# Patient Record
Sex: Female | Born: 1945 | Race: White | Hispanic: No | Marital: Married | Smoking: Never smoker
Health system: Southern US, Community
[De-identification: ages and names within clinical notes are randomized; demographics above are authoritative.]

## PROBLEM LIST (undated history)

## (undated) DIAGNOSIS — R7301 Impaired fasting glucose: Secondary | ICD-10-CM

## (undated) DIAGNOSIS — Z9289 Personal history of other medical treatment: Secondary | ICD-10-CM

## (undated) DIAGNOSIS — D249 Benign neoplasm of unspecified breast: Secondary | ICD-10-CM

## (undated) DIAGNOSIS — R197 Diarrhea, unspecified: Secondary | ICD-10-CM

## (undated) DIAGNOSIS — I10 Essential (primary) hypertension: Secondary | ICD-10-CM

## (undated) DIAGNOSIS — IMO0001 Reserved for inherently not codable concepts without codable children: Secondary | ICD-10-CM

## (undated) DIAGNOSIS — M1990 Unspecified osteoarthritis, unspecified site: Secondary | ICD-10-CM

## (undated) DIAGNOSIS — I639 Cerebral infarction, unspecified: Secondary | ICD-10-CM

## (undated) DIAGNOSIS — K9189 Other postprocedural complications and disorders of digestive system: Secondary | ICD-10-CM

## (undated) DIAGNOSIS — K589 Irritable bowel syndrome without diarrhea: Secondary | ICD-10-CM

## (undated) DIAGNOSIS — K5730 Diverticulosis of large intestine without perforation or abscess without bleeding: Secondary | ICD-10-CM

## (undated) DIAGNOSIS — M722 Plantar fascial fibromatosis: Secondary | ICD-10-CM

## (undated) DIAGNOSIS — K219 Gastro-esophageal reflux disease without esophagitis: Secondary | ICD-10-CM

## (undated) DIAGNOSIS — F5104 Psychophysiologic insomnia: Secondary | ICD-10-CM

## (undated) DIAGNOSIS — J302 Other seasonal allergic rhinitis: Secondary | ICD-10-CM

## (undated) DIAGNOSIS — Z9889 Other specified postprocedural states: Secondary | ICD-10-CM

## (undated) HISTORY — DX: Essential (primary) hypertension: I10

## (undated) HISTORY — DX: Diarrhea, unspecified: R19.7

## (undated) HISTORY — DX: Psychophysiologic insomnia: F51.04

## (undated) HISTORY — DX: Reserved for inherently not codable concepts without codable children: IMO0001

## (undated) HISTORY — DX: Gastro-esophageal reflux disease without esophagitis: K21.9

## (undated) HISTORY — DX: Irritable bowel syndrome, unspecified: K58.9

## (undated) HISTORY — PX: KNEE SURGERY: SHX244

## (undated) HISTORY — DX: Plantar fascial fibromatosis: M72.2

## (undated) HISTORY — DX: Other seasonal allergic rhinitis: J30.2

## (undated) HISTORY — PX: LUMBAR LAMINECTOMY: SHX95

## (undated) HISTORY — DX: Cerebral infarction, unspecified: I63.9

## (undated) HISTORY — DX: Other specified postprocedural states: Z98.890

## (undated) HISTORY — DX: Benign neoplasm of unspecified breast: D24.9

## (undated) HISTORY — DX: Unspecified osteoarthritis, unspecified site: M19.90

## (undated) HISTORY — DX: Other postprocedural complications and disorders of digestive system: K91.89

## (undated) HISTORY — DX: Impaired fasting glucose: R73.01

## (undated) HISTORY — DX: Diverticulosis of large intestine without perforation or abscess without bleeding: K57.30

## (undated) SURGERY — EGD (ESOPHAGOGASTRODUODENOSCOPY)
Anesthesia: Moderate Sedation

---
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
## 1982-11-30 HISTORY — PX: ABDOMINAL HYSTERECTOMY: SHX81

## 1985-11-30 HISTORY — PX: OTHER SURGICAL HISTORY: SHX169

## 1999-12-02 ENCOUNTER — Other Ambulatory Visit: Admission: RE | Admit: 1999-12-02 | Discharge: 1999-12-02 | Payer: Self-pay | Admitting: Obstetrics and Gynecology

## 2000-12-06 ENCOUNTER — Other Ambulatory Visit: Admission: RE | Admit: 2000-12-06 | Discharge: 2000-12-06 | Payer: Self-pay | Admitting: Obstetrics and Gynecology

## 2001-12-07 ENCOUNTER — Other Ambulatory Visit: Admission: RE | Admit: 2001-12-07 | Discharge: 2001-12-07 | Payer: Self-pay | Admitting: Obstetrics and Gynecology

## 2002-05-15 ENCOUNTER — Ambulatory Visit (HOSPITAL_COMMUNITY): Admission: RE | Admit: 2002-05-15 | Discharge: 2002-05-15 | Payer: Self-pay | Admitting: Gastroenterology

## 2003-03-06 ENCOUNTER — Other Ambulatory Visit: Admission: RE | Admit: 2003-03-06 | Discharge: 2003-03-06 | Payer: Self-pay | Admitting: Obstetrics and Gynecology

## 2004-05-13 ENCOUNTER — Encounter: Admission: RE | Admit: 2004-05-13 | Discharge: 2004-05-13 | Payer: Self-pay | Admitting: Internal Medicine

## 2004-06-25 ENCOUNTER — Other Ambulatory Visit: Admission: RE | Admit: 2004-06-25 | Discharge: 2004-06-25 | Payer: Self-pay | Admitting: Obstetrics and Gynecology

## 2005-08-05 ENCOUNTER — Other Ambulatory Visit: Admission: RE | Admit: 2005-08-05 | Discharge: 2005-08-05 | Payer: Self-pay | Admitting: Obstetrics and Gynecology

## 2005-08-13 ENCOUNTER — Ambulatory Visit (HOSPITAL_COMMUNITY): Admission: RE | Admit: 2005-08-13 | Discharge: 2005-08-13 | Payer: Self-pay | Admitting: *Deleted

## 2005-10-30 ENCOUNTER — Ambulatory Visit (HOSPITAL_COMMUNITY): Admission: RE | Admit: 2005-10-30 | Discharge: 2005-10-30 | Payer: Self-pay | Admitting: General Surgery

## 2005-10-30 ENCOUNTER — Encounter (INDEPENDENT_AMBULATORY_CARE_PROVIDER_SITE_OTHER): Payer: Self-pay | Admitting: Specialist

## 2005-11-30 DIAGNOSIS — M8580 Other specified disorders of bone density and structure, unspecified site: Secondary | ICD-10-CM

## 2005-11-30 HISTORY — DX: Other specified disorders of bone density and structure, unspecified site: M85.80

## 2007-09-14 ENCOUNTER — Other Ambulatory Visit: Admission: RE | Admit: 2007-09-14 | Discharge: 2007-09-14 | Payer: Self-pay | Admitting: Obstetrics and Gynecology

## 2007-12-01 HISTORY — PX: CHOLECYSTECTOMY: SHX55

## 2008-01-02 ENCOUNTER — Encounter: Admission: RE | Admit: 2008-01-02 | Discharge: 2008-01-02 | Payer: Self-pay | Admitting: Gastroenterology

## 2008-11-30 DIAGNOSIS — K922 Gastrointestinal hemorrhage, unspecified: Secondary | ICD-10-CM

## 2008-11-30 HISTORY — DX: Gastrointestinal hemorrhage, unspecified: K92.2

## 2009-06-24 ENCOUNTER — Observation Stay (HOSPITAL_COMMUNITY): Admission: AD | Admit: 2009-06-24 | Discharge: 2009-06-26 | Payer: Self-pay | Admitting: Internal Medicine

## 2009-06-30 DIAGNOSIS — S62102A Fracture of unspecified carpal bone, left wrist, initial encounter for closed fracture: Secondary | ICD-10-CM

## 2009-06-30 HISTORY — DX: Fracture of unspecified carpal bone, left wrist, initial encounter for closed fracture: S62.102A

## 2009-08-16 ENCOUNTER — Ambulatory Visit (HOSPITAL_COMMUNITY): Admission: RE | Admit: 2009-08-16 | Discharge: 2009-08-16 | Payer: Self-pay | Admitting: Specialist

## 2009-08-16 ENCOUNTER — Encounter (INDEPENDENT_AMBULATORY_CARE_PROVIDER_SITE_OTHER): Payer: Self-pay | Admitting: Specialist

## 2009-08-16 ENCOUNTER — Ambulatory Visit: Payer: Self-pay | Admitting: Vascular Surgery

## 2009-08-29 ENCOUNTER — Encounter: Admission: RE | Admit: 2009-08-29 | Discharge: 2009-08-29 | Payer: Self-pay | Admitting: Specialist

## 2009-11-30 DIAGNOSIS — D249 Benign neoplasm of unspecified breast: Secondary | ICD-10-CM

## 2009-11-30 HISTORY — PX: BREAST BIOPSY: SHX20

## 2009-11-30 HISTORY — DX: Benign neoplasm of unspecified breast: D24.9

## 2009-12-23 ENCOUNTER — Other Ambulatory Visit: Admission: RE | Admit: 2009-12-23 | Discharge: 2009-12-23 | Payer: Self-pay | Admitting: Obstetrics and Gynecology

## 2009-12-23 ENCOUNTER — Ambulatory Visit: Payer: Self-pay | Admitting: Obstetrics and Gynecology

## 2011-03-08 LAB — CROSSMATCH
ABO/RH(D): A POS
Antibody Screen: NEGATIVE

## 2011-03-08 LAB — COMPREHENSIVE METABOLIC PANEL
ALT: 28 U/L (ref 0–35)
AST: 33 U/L (ref 0–37)
Albumin: 3.9 g/dL (ref 3.5–5.2)
Alkaline Phosphatase: 84 U/L (ref 39–117)
BUN: 7 mg/dL (ref 6–23)
CO2: 29 mEq/L (ref 19–32)
Calcium: 9.4 mg/dL (ref 8.4–10.5)
Chloride: 103 mEq/L (ref 96–112)
Creatinine, Ser: 0.76 mg/dL (ref 0.4–1.2)
GFR calc Af Amer: >60 mL/min (ref >60)
GFR calc non Af Amer: >60 mL/min (ref >60)
Glucose, Bld: 99 mg/dL (ref 70–99)
Potassium: 3.8 mEq/L (ref 3.5–5.1)
Sodium: 140 mEq/L (ref 135–145)
Total Bilirubin: 0.9 mg/dL (ref 0.3–1.2)
Total Protein: 6.8 g/dL (ref 6.0–8.3)

## 2011-03-08 LAB — HEMOGLOBIN AND HEMATOCRIT, BLOOD
HCT: 40.3 % (ref 36.0–46.0)
HCT: 41.3 % (ref 36.0–46.0)
Hemoglobin: 13.9 g/dL (ref 12.0–15.0)
Hemoglobin: 14.1 g/dL (ref 12.0–15.0)

## 2011-03-08 LAB — CBC
HCT: 40.1 % (ref 36.0–46.0)
HCT: 43.6 % (ref 36.0–46.0)
Hemoglobin: 14 g/dL (ref 12.0–15.0)
Hemoglobin: 14.8 g/dL (ref 12.0–15.0)
MCHC: 34 g/dL (ref 30.0–36.0)
MCHC: 34.8 g/dL (ref 30.0–36.0)
MCV: 91.8 fL (ref 78.0–100.0)
MCV: 92.2 fL (ref 78.0–100.0)
Platelets: 254 10*3/uL (ref 150–400)
Platelets: 262 10*3/uL (ref 150–400)
RBC: 4.35 MIL/uL (ref 3.87–5.11)
RBC: 4.75 MIL/uL (ref 3.87–5.11)
RDW: 13.1 % (ref 11.5–15.5)
RDW: 13.5 % (ref 11.5–15.5)
WBC: 11.8 10*3/uL — ABNORMAL HIGH (ref 4.0–10.5)
WBC: 14 10*3/uL — ABNORMAL HIGH (ref 4.0–10.5)

## 2011-03-08 LAB — ABO/RH: ABO/RH(D): A POS

## 2011-03-08 LAB — APTT: aPTT: 31 seconds (ref 24–37)

## 2011-03-08 LAB — HEMOCCULT GUIAC POC 1CARD (OFFICE): Fecal Occult Bld: POSITIVE

## 2011-03-08 LAB — PROTIME-INR
INR: 1 (ref 0.00–1.49)
Prothrombin Time: 13.1 seconds (ref 11.6–15.2)

## 2011-04-14 NOTE — H&P (Signed)
Jordan Lane, TOVES             ACCOUNT NO.:  [id number]   MEDICAL RECORD NO.:  [id number]            PATIENT TYPE:   LOCATION:                                 FACILITY:   PHYSICIAN:  Deirdre Peer. Polite, M.D.      DATE OF BIRTH:   DATE OF ADMISSION:  DATE OF DISCHARGE:                              HISTORY & PHYSICAL   HISTORY OF PRESENT ILLNESS:  This is a 65 year old female who presents  to the office with a 2 day history of blood per rectum.  Note, she does  have a history of diverticulosis and left side colon AVMs.  She denies  any nausea or vomiting.  Denies any hematemesis.  She has had  colonoscopy in the past with findings as stated above.  She does  chronically take an aspirin a day, however, denies any additional  NSAIDS.  Denies feeling lightheaded, dizzy or weak.  The patient  presents to the office because of above complaint of blood per rectum.  Yesterday she had squirts of blood after bowel movements, 3-4 times  yesterday and today had at least half a cup of blood per rectum.  It was  red in color.  Admission is deemed necessary for further evaluation and  treatment.   PAST MEDICAL HISTORY:  Significant for:  1  Hypertension.  1. GERD.  2. Esophageal stricture.  3. Cholecystectomy.  4. Chronic insomnia.  5. Plantar fasciitis.  6. Seasonal allergies.  7. Left colon diverticulosis and colonic AVMs.  8. 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  10.Osteopenia.   MEDICATIONS ON ADMISSION:  1. Maxzide 37.5/25 mg.  2. Nexium 40 mg daily.  3. Ativan 1 mg one-half q.h.s.  4. Multivitamin daily.  5. Citracal b.i.d.  6. Aspirin 81 mg daily.  7. Colestipol  1 gram b.i.d.  8. Wellbutrin XL 150 mg daily.  9. Vitamin D 1000 international units a day.   SOCIAL HISTORY:  Negative for tobacco, alcohol or drugs.   PAST SURGICAL HISTORY:  Significant for hysterectomy 1984, lumbar  laminectomy 1987,  laparoscopic cholecystectomy in  2006.   FAMILY HISTORY:  Noncontributory.   REVIEW OF SYSTEMS.:  As stated in HPI.   PHYSICAL EXAMINATION:  The patient is pleasant in no apparent distress.  Temperature 98.3, BP 140/90, pulse 88.  HEENT EXAM: Significant for pale sclerae, otherwise unremarkable.  CHEST: Clear without rales or rhonchi.  CARDIOVASCULAR:  Regular S1-S2.  No history appreciated.  ABDOMEN: Soft, vague discomfort in left lower quadrant.  EXTREMITIES: No edema.  RECTAL EXAM:  Grossly heme positive.   ASSESSMENT:  Gastrointestinal bleed, probably secondary to  diverticulosis, plus or minus secondary to arteriovenous malformations.  Currently the patient is stable, however she has continued to have blood  per rectum, therefore admission to the hospital is recommended.  The  patient will have a stat blood work, serial hemoglobin and hematocrit,  and make further recommendations pending her hospital course.  If  recurrent bleed with significant drop in hemoglobin, transfusion may be  required.  As she has had  colonoscopies in the past, last in 2003,  additional colonoscopy may not be indicated.  Again will make further  recommendations as her course dictates.      Deirdre Peer. Polite, M.D.  Electronically Signed     RDP/MEDQ  D:  06/24/2009  T:  06/24/2009  Job:  [id number]

## 2011-04-14 NOTE — Consult Note (Signed)
Jordan Lane, Jordan Lane             ACCOUNT NO.:  [id number]   MEDICAL RECORD NO.:  [id number]          PATIENT TYPE:  INP   LOCATION:  6711                         FACILITY:  [facility]   PHYSICIAN:  Jordan Lane, M.D. DATE OF BIRTH:  02-02-1946   DATE OF CONSULTATION:  06/25/2009  DATE OF DISCHARGE:                                 CONSULTATION   The patient is a 65 year old female with past medical history of AVM and  diverticulosis on colonoscopy in 2003, now presents with a complaint of  blood in stool.  The patient first experienced blood in stool 2 days  prior to admission.  The patient reports chronic stage of diarrhea since  cholecystectomy.  The patient has another episode of diarrhea 2 days  prior and felt little sick.  After the diarrhea, she felt better.  Subsequently, she started noticing some stool mixed with blood.  She had  another episode of blood in stool day prior to admission.  She describes  some blood streaks in stool to small amount of frank blood mixed in  stool.  She estimates it to be about 1/4th cup at most.  She reports no  fever.  She reports no abdominal pain or cramping.  She does not report  dizziness or loss of consciousness.   ALLERGIES:  CODEINE gives the patient nausea and vomiting.   PAST MEDICAL HISTORY:  1. Hypertension.  2. GERD.  3. Esophageal stricture.  4. Cholecystectomy.  5. Insomnia.  6. Plantar fasciitis.  7. Seasonal allergies.  8. Left colon diverticulosis and colonic AVMs.  9. History of positive PPD, but not treated.  10.Osteopenia.   MEDICATIONS AT HOME:  Axid, Nexium, Ativan, multivitamins, Citracal,  aspirin, colestipol, Wellbutrin XL, and vitamin D.   SOCIAL HISTORY:  The patient does not report consuming alcohol, tobacco,  or drugs.   SURGICAL HISTORY:  1. The patient had a hysterectomy in 1984.  2. Lumbar laminectomy in 1987.  3. Laparoscopic cholecystectomy in 2006.   FAMILY MEDICAL HISTORY:  The patient's father  had a colonic resection,  but the patient is unsure what necessitated the procedure as she was  very young at that time.  Father died at age of 72 secondary to lung  cancer.  The patient's sister who also is patient of Dr. Ermalene Searing gets  frequent colonoscopy secondary to polyps.   PHYSICAL EXAMINATION:  VITAL SIGNS ON ADMISSION:  T-max 98.4, blood  pressure 133/84, respiratory rate of 16, pulse of 78, and 96% O2  saturation on room air.  GENERAL:  The patient is in no acute distress.  HEENT:  Oral mucosa pink and moist.  Eyes, EOMI.  PERRLA.  NECK:  Full range of motion, supple.  No thyromegaly.  RESPIRATORY SYSTEM:  Clear to auscultation bilaterally.  No wheezing.  No crackles and no rhonchi.  CARDIOVASCULAR SYSTEM:  S1 and S2 are normal.  Regular rate and rhythm.  ABDOMEN:  Soft and nontender.  Minimal pain in the left lower quadrant  on deep palpation.  No guarding.  No rigidity.  No hepatosplenomegaly.  Bowel sounds are normal.  CNS:  Alert and oriented x3.  Cranial nerves II through XII intact.   LABORATORIES ON ADMISSION:  Hemoglobin 14.8, white count of 14, and  platelets 262.  Hemoglobin has been measured 3 times thereafter,  hemoglobin of 14.1 and 13.9 subsequently.  Sodium was 140, potassium of  3.8, chloride is 103, bicarb 29, BUN 7, creatinine 0.76, and glucose of  99.  Fecal occult stool positive.   ASSESSMENT AND PLAN:  1. Lower gastrointestinal bleed.  The patient has Hemoccult-positive      stools in the setting of prior history of arteriovenous      malformation and diverticulosis and colonoscopy done in 2003.      Repeat colonoscopy last year failed due to colonic looping per      patient with Dr. Laural Benes.  The barium enema was done thereafter      that could not exclude a small polypoid lesion in colon in February      2009.  The patient remained asymptomatic throughout before this      episode.  The patient is clinically stable without any signs of       significant hypovolemia.  The patient will be continued to      resuscitated and monitored with H and H measuring.  The patient is      on liquid diet and will recommend continue the liquid diet.  If the      patient shows continues hemoglobin drop, the patient would be      considered for repeat colonoscopy.  However, at this time, urgent      colonoscopy is not required.  We will continue the symptomatic      management for now.  2. Diarrhea.  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  The patient reports some      success with this medication.  The patient will be continued to be      monitored and treated on outpatient basis for this problem.  No      further stool exams are necessary at this time.  3. Leukocytosis.  The patient has leukocytosis on this admission.  The      patient does not have any clinical signs of infection in terms of      fever, nausea, or vomiting.  The patient will be continued to      monitored for possibility of acute viral or bacterial      gastroenteritis.  The patient reports no continued diarrhea at this      time.  No further workup from GI standpoint is necessary at this      time for leukocytosis.      Jordan Lav, MD PhD  Electronically Signed     ______________________________  Jordan Lane. Randa Lane, M.D.    RS/MEDQ  D:  06/25/2009  T:  06/25/2009  Job:  [id number]

## 2011-04-14 NOTE — Discharge Summary (Signed)
NAMEMELITZA, Lane             ACCOUNT NO.:  [id number]   MEDICAL RECORD NO.:  [id number]          PATIENT TYPE:  INP   LOCATION:  6711                         FACILITY:  [facility]   PHYSICIAN:  Thora Lance, M.D.  DATE OF BIRTH:  [birth year]   DATE OF ADMISSION:  06/24/2009  DATE OF DISCHARGE:  06/26/2009                               DISCHARGE SUMMARY   REASON FOR ADMISSION:  A 65 year old female with a history of  diverticulosis and left-sided colon AVMs, who presented with  hematochezia of one days duration.  She had up to a half a cup of blood  in her bowel movement.  She denied abdominal pain, dizziness, or  weakness.   SIGNIFICANT FINDINGS:  VITAL SIGNS:  Blood pressure 140/90 and heart  rate 80, and temperature 99.3.  LUNGS:  Clear.  HEART:  Regular rate and rhythm without murmur, gallop, or rub.  ABDOMEN:  Soft.  Vague discomfort in left lower quadrant.  EXTREMITIES:  No edema.   LABORATORIES:  Sodium 140, potassium 3.8, chloride 103, bicarbonate 29,  glucose 99, BUN 17, and creatinine 0.7.  Liver function tests were  normal.  PT 113.1 and PTT 31.  CBC:  WBC 14.0, hemoglobin 14.8 and  platelet count 262.   HOSPITAL COURSE:  The patient was admitted with GI bleed.  She did not  have any significant further GI bleeding during the hospitalization.  Her vital signs remained stable.  She was placed on a clear liquid diet.  She was seen by the Gastroenterology Service.  She had a review of her  GI workup from February 2009, which had shown normal colonoscopy to the  splenic flexure and then a follow up barium enema, which was  unremarkable.  Her hemoglobin remained stable throughout the  hospitalization and was 14 at discharge.  Because of the relatively low  volume of her GI bleed and her recent GI workup in 2009, which was  negative for further GI workup, was not felt to be necessary.  Her diet  was advanced.  She tolerated this well.   DISPOSITION:  She was discharged  in good condition.   DISCHARGE DIAGNOSES:  1. Lower gastrointestinal bleed.  2. Hypertension.  3. Gastroesophageal reflux disease with esophageal stricture.  4. History of cholecystectomy with post-choleraic diarrhea.  5. Chronic insomnia.  6. Seasonal allergic rhinitis.  7. History of diverticulosis.  8. Impaired fasting glucose.  9. Osteopenia.   PROCEDURES:  None.   DISCHARGE MEDICATIONS:  1. Maxzide 37.5/25 mg 1 a day.  2. Nexium 40 mg once a day.  3. Lorazepam 1 mg one-half to 1 tablet at bedtime.  4. Multivitamin 1 a day.  5. Trocal 2 tablets twice a day.  6. Aspirin 81 mg once a day, on hold for 1 week.  7. Colestipol 1 g twice a day as needed for diarrhea.  8. Wellbutrin XL 150 mg q.a.m.  9. Vitamin D 1000 units daily.   DISPOSITION:  Discharged to home.   ACTIVITY:  As tolerated.   FOLLOWUP:  Follow up in 2 weeks with Dr. Valentina Lucks.   DIET:  Low-sodium, high-fiber diet.           ______________________________  Thora Lance, M.D.     JJG/MEDQ  D:  06/26/2009  T:  06/26/2009  Job:  [id number]   cc:   [facility]

## 2011-04-17 NOTE — Procedures (Signed)
[facility]  Patient:    Jordan Lane, MCCONATHY Visit Number: [id number] MRN: [id number]          Service Type: END Location: ENDO Attending Physician:  Dennison Bulla Ii Dictated by:   Verlin Grills, M.D. Proc. Date: 05/15/02 Admit Date:  05/15/2002 Discharge Date: 05/15/2002   CC:         Thora Lance, M.D.   Procedure Report  PROCEDURE:  Colonoscopy.  PROCEDURE INDICATION:  [name] is a 65 year old female, born [birth year].  [name] is scheduled to undergo her first screening colonoscopy with polypectomy to prevent colon cancer.  I discussed with [name] the complications associated with colonoscopy and polypectomy, including a 15 per 1000 risk of bleeding and 4 per 1000 risk of colon perforation requiring surgical repair.  [name] has signed the operative permit.  ENDOSCOPIST:  Verlin Grills, M.D.  PREMEDICATION:  Demerol 75 mg, Versed 7 mg.  ENDOSCOPE:  Olympus pediatric colonoscope.  DESCRIPTION OF PROCEDURE:  After obtaining informed consent, [name] was placed in the left lateral decubitus position.  I administered intravenous Demerol and intravenous Versed to achieve conscious sedation for the procedure.  The patients blood pressure, oxygen saturation, and cardiac rhythm were monitored throughout the procedure and documented in the medical record.  Anal inspection was normal.  Digital rectal exam was normal.  The Olympus pediatric video colonoscope was introduced into the rectum and advanced to the cecum.  Colonic preparation for the exam today was excellent.  RECTUM:  Normal.  SIGMOID COLON AND DESCENDING COLON:  Diverticulosis.  SPLENIC FLEXURE:  Normal.  TRANSVERSE COLON:  There are numerous scattered arteriovenous malformations (no bleeding) involving the mid-distal transverse colon.  HEPATIC FLEXURE:  Normal.  ASCENDING COLON:  Normal.  CECUM AND ILEOCECAL VALVE:   Normal.  ASSESSMENT: 1. Left colonic diverticulosis 2. Scattered, nonbleeding arteriovenous malformations involving the mid-distal    transverse colon. 3. No endoscopic evidence for the presence of colorectal neoplasia. Dictated by:   Verlin Grills, M.D. Attending Physician:  Dennison Bulla Ii DD:  05/15/02 TD:  05/16/02 Job: [id number]

## 2011-04-17 NOTE — Op Note (Signed)
Jordan Lane, Jordan Lane             ACCOUNT NO.:  [id number]   MEDICAL RECORD NO.:  [id number]          PATIENT TYPE:  AMB   LOCATION:  DAY                          FACILITY:  [facility]   PHYSICIAN:  Adolph Pollack, M.D.DATE OF BIRTH:  08-May-1946   DATE OF PROCEDURE:  10/30/2005  DATE OF DISCHARGE:                                 OPERATIVE REPORT   PREOPERATIVE DIAGNOSES:  Symptomatic cholelithiasis.   POSTOPERATIVE DIAGNOSES:  Symptomatic cholelithiasis.   PROCEDURE:  Laparoscopic cholecystectomy with intraoperative cholangiogram.   SURGEON:  Adolph Pollack, M.D.   ASSISTANT:  Ollen Gross. Carolynne Edouard, M.D.   ANESTHESIA:  General.   INDICATIONS:  Ms. Dice is a 65 year old female whose been having  intermittent postprandial right upper quadrant pain specifically after a  fatty or spicy meal. An ultrasound demonstrated cholelithiasis about a year  and half ago. She continues to have some problems with biliary colic type  pain and now presents for laparoscopic cholecystectomy. The procedure and  the risks were explained to her preoperatively.   TECHNIQUE:  She was seen in the holding room and then brought to the  operating room, placed supine on the operating table and general anesthetic  was administered. The abdominal wall was sterilely prepped and draped.  Dilute Marcaine solution was infiltrated in the subumbilical region and a  small subumbilical incision was made through the skin and subcutaneous  tissue, fascia and peritoneum entering the peritoneal cavity under direct  vision. A pursestring suture of #0 Vicryl was placed around the fascial  edges. A Hassan trocar was introduced into the peritoneal cavity and a  pneumoperitoneum was created by insufflation of CO2 gas.   Next the laparoscope was introduced and she was placed in reverse  Trendelenburg position, right side tilted slightly upward. An 11 mm trocar  was placed through an epigastric incision and two 5 mm trocars  placed in the  right mid lateral abdomen. Omental adhesions were noted at the fundus of the  gallbladder and using electrocautery and sharp dissection, these were  dissected free from the fundus. Omental adhesions were then dissected free  from the body and infundibulum of the gallbladder. The fundus of the  gallbladder was grasped and retracted toward the right shoulder. The  infundibulum was then mobilized with dissection on the gallbladder. I then  isolated the cystic duct and created a window around it. The cystic artery  was also isolated and a window created around it. A clip was placed at the  cystic duct gallbladder junction and small incision made in the cystic duct.  A cholangiocath was passed through the anterior abdominal wall and placed  into the cystic duct and a cholangiogram was performed.   Under real time fluoroscopy, dilute contrast material was injected into the  cystic duct which was of moderate to long length. The common hepatic, right  and left hepatic and common bile ducts all opacified promptly and contrast  drained promptly into the duodenum without obvious evidence of obstruction.  The final report is pending the radiologist interpretation.   The cholangiocath was removed, the cystic duct was clipped three  times  proximally and divided. The cystic artery was then clipped and divided. The  gallbladder was dissected free from the liver using electrocautery and  placed in an Endopouch bag. The gallbladder fossa was irrigated and bleeding  points controlled with the cautery. It was irrigated again and inspected and  no further bleeding was noted. No bile leak was noted. The irrigation fluid  was evacuated.   The gallbladder was then removed through the subumbilical incision and then  the subumbilical fascial defect closed under laparoscopic vision by  tightening up and tying down the pursestring suture. The remaining trocars  were removed and a pneumoperitoneum  was released. The skin incisions were  closed with 4-0 Monocryl subcuticular stitches followed by Steri-Strips and  sterile dressings. She tolerated the procedure without any apparent  complications and was taken to the recovery room in satisfactory condition.      Adolph Pollack, M.D.  Electronically Signed     TJR/MEDQ  D:  10/30/2005  T:  10/30/2005  Job:  [id number]   cc:   Althea Grimmer. Luther Parody, M.D.  Fax: [phone]   Thora Lance, M.D.  Fax: [phone]

## 2011-09-02 ENCOUNTER — Other Ambulatory Visit: Payer: Self-pay | Admitting: Dermatology

## 2012-02-18 ENCOUNTER — Ambulatory Visit: Payer: Medicare Other | Attending: Specialist | Admitting: Physical Therapy

## 2012-02-18 DIAGNOSIS — M25669 Stiffness of unspecified knee, not elsewhere classified: Secondary | ICD-10-CM | POA: Insufficient documentation

## 2012-02-18 DIAGNOSIS — IMO0001 Reserved for inherently not codable concepts without codable children: Secondary | ICD-10-CM | POA: Insufficient documentation

## 2012-02-18 DIAGNOSIS — M25569 Pain in unspecified knee: Secondary | ICD-10-CM | POA: Insufficient documentation

## 2012-02-23 ENCOUNTER — Ambulatory Visit: Payer: Medicare Other | Admitting: Physical Therapy

## 2012-02-25 ENCOUNTER — Encounter: Payer: Medicare Other | Admitting: Physical Therapy

## 2012-04-14 ENCOUNTER — Encounter: Payer: Self-pay | Admitting: Gynecology

## 2012-04-14 DIAGNOSIS — I10 Essential (primary) hypertension: Secondary | ICD-10-CM | POA: Insufficient documentation

## 2012-04-14 DIAGNOSIS — D249 Benign neoplasm of unspecified breast: Secondary | ICD-10-CM | POA: Insufficient documentation

## 2012-04-14 DIAGNOSIS — N814 Uterovaginal prolapse, unspecified: Secondary | ICD-10-CM | POA: Insufficient documentation

## 2012-04-19 ENCOUNTER — Ambulatory Visit (INDEPENDENT_AMBULATORY_CARE_PROVIDER_SITE_OTHER): Payer: Medicare Other | Admitting: Obstetrics and Gynecology

## 2012-04-19 ENCOUNTER — Encounter: Payer: Self-pay | Admitting: Obstetrics and Gynecology

## 2012-04-19 VITALS — BP 144/86 | Ht 62.0 in | Wt 172.0 lb

## 2012-04-19 DIAGNOSIS — IMO0001 Reserved for inherently not codable concepts without codable children: Secondary | ICD-10-CM | POA: Insufficient documentation

## 2012-04-19 DIAGNOSIS — M899 Disorder of bone, unspecified: Secondary | ICD-10-CM

## 2012-04-19 DIAGNOSIS — D241 Benign neoplasm of right breast: Secondary | ICD-10-CM

## 2012-04-19 DIAGNOSIS — M8580 Other specified disorders of bone density and structure, unspecified site: Secondary | ICD-10-CM

## 2012-04-19 DIAGNOSIS — N952 Postmenopausal atrophic vaginitis: Secondary | ICD-10-CM

## 2012-04-19 DIAGNOSIS — N816 Rectocele: Secondary | ICD-10-CM

## 2012-04-19 DIAGNOSIS — M949 Disorder of cartilage, unspecified: Secondary | ICD-10-CM

## 2012-04-19 DIAGNOSIS — D249 Benign neoplasm of unspecified breast: Secondary | ICD-10-CM

## 2012-04-19 NOTE — Progress Notes (Signed)
Patient came to see me today for further followup. 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 She does have some trouble getting stool out with it. It is not enough of an inconvenience to consider surgery. She has not had dyspareunia due to her rectocele. She does have vaginal dryness however and uses lubricant. She is not having pelvic pain. She had a stress fracture of her knee which required surgery. The orthopedist did not mention that her bones were thin. She is getting ready to have a partial knee replacement. She has had osteopenia on bone density not requiring intervention. Her bone densities are  done by her PCP. She has a fibroadenoma of the right breast which has been biopsied. She is late going back for her mammogram due to her daughter's serious illness. She is not having any vaginal bleeding.  ROS: 12 system review done. Pertinent positives above. Only other positives are hypertension and GERD.  HEENT: Within normal limits. Kennon Portela present. Neck: No masses. Supraclavicular lymph nodes: Not enlarged. Breasts: Examined in both sitting and lying position. Symmetrical without skin changes or masses. Abdomen: Soft no masses guarding or rebound. No hernias. Pelvic: External within normal limits. BUS within normal limits. Vaginal examination shows atrophic vaginitis. Patient has second-degree rectocele. Cervix and uterus absent. Adnexa within normal limits. Rectovaginal confirmatory. Extremities within normal limits.  Assessment: #1. Osteopenia with stress fracture #2. 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 She will check with her orthopedist. Mammogram. Bone density.

## 2012-05-31 ENCOUNTER — Encounter (HOSPITAL_COMMUNITY): Payer: Self-pay | Admitting: Pharmacy Technician

## 2012-06-07 ENCOUNTER — Other Ambulatory Visit (HOSPITAL_COMMUNITY): Payer: Medicare Other

## 2012-06-07 ENCOUNTER — Encounter (HOSPITAL_COMMUNITY): Payer: Self-pay

## 2012-06-07 ENCOUNTER — Ambulatory Visit (HOSPITAL_COMMUNITY)
Admission: RE | Admit: 2012-06-07 | Discharge: 2012-06-07 | Disposition: A | Discharge status: Home / Self Care | Payer: Medicare Other | Source: Ambulatory Visit | Attending: Orthopedic Surgery | Admitting: Orthopedic Surgery

## 2012-06-07 ENCOUNTER — Encounter (HOSPITAL_COMMUNITY)
Admission: RE | Admit: 2012-06-07 | Discharge: 2012-06-07 | Payer: Medicare Other | Source: Ambulatory Visit | Attending: Orthopedic Surgery | Admitting: Orthopedic Surgery

## 2012-06-07 ENCOUNTER — Inpatient Hospital Stay (HOSPITAL_COMMUNITY): Admission: RE | Admit: 2012-06-07 | Payer: Medicare Other | Source: Ambulatory Visit

## 2012-06-07 DIAGNOSIS — M1710 Unilateral primary osteoarthritis, unspecified knee: Secondary | ICD-10-CM | POA: Insufficient documentation

## 2012-06-07 DIAGNOSIS — I10 Essential (primary) hypertension: Secondary | ICD-10-CM | POA: Insufficient documentation

## 2012-06-07 DIAGNOSIS — Z01810 Encounter for preprocedural cardiovascular examination: Secondary | ICD-10-CM | POA: Insufficient documentation

## 2012-06-07 DIAGNOSIS — Z01812 Encounter for preprocedural laboratory examination: Secondary | ICD-10-CM | POA: Insufficient documentation

## 2012-06-07 HISTORY — DX: Unspecified osteoarthritis, unspecified site: M19.90

## 2012-06-07 HISTORY — DX: Gastro-esophageal reflux disease without esophagitis: K21.9

## 2012-06-07 HISTORY — DX: Personal history of other medical treatment: Z92.89

## 2012-06-07 LAB — URINE MICROSCOPIC-ADD ON

## 2012-06-07 LAB — DIFFERENTIAL
Basophils Relative: 1 % (ref 0–1)
Eosinophils Absolute: 0.3 10*3/uL (ref 0.0–0.7)
Eosinophils Relative: 2 % (ref 0–5)
Monocytes Relative: 8 % (ref 3–12)
Neutrophils Relative %: 60 % (ref 43–77)

## 2012-06-07 LAB — CBC
MCH: 29.9 pg (ref 26.0–34.0)
MCHC: 33.2 g/dL (ref 30.0–36.0)
Platelets: 334 10*3/uL (ref 150–400)

## 2012-06-07 LAB — URINALYSIS, ROUTINE W REFLEX MICROSCOPIC
Bilirubin Urine: NEGATIVE
Glucose, UA: NEGATIVE mg/dL
Hgb urine dipstick: NEGATIVE
Specific Gravity, Urine: 1.036 — ABNORMAL HIGH (ref 1.005–1.030)
Urobilinogen, UA: 0.2 mg/dL (ref 0.0–1.0)

## 2012-06-07 LAB — COMPREHENSIVE METABOLIC PANEL
ALT: 20 U/L (ref 0–35)
Alkaline Phosphatase: 81 U/L (ref 39–117)
BUN: 15 mg/dL (ref 6–23)
CO2: 28 mEq/L (ref 19–32)
GFR calc Af Amer: >90 mL/min (ref >90)
GFR calc non Af Amer: 85 mL/min — ABNORMAL LOW (ref >90)
Glucose, Bld: 103 mg/dL — ABNORMAL HIGH (ref 70–99)
Potassium: 3.5 mEq/L (ref 3.5–5.1)
Total Bilirubin: 0.4 mg/dL (ref 0.3–1.2)
Total Protein: 7.2 g/dL (ref 6.0–8.3)

## 2012-06-07 LAB — PROTIME-INR
INR: 0.94 (ref 0.00–1.49)
Prothrombin Time: 12.8 seconds (ref 11.6–15.2)

## 2012-06-07 LAB — SURGICAL PCR SCREEN: MRSA, PCR: NEGATIVE

## 2012-06-07 NOTE — Patient Instructions (Signed)
[address]  06/07/2012   Your procedure is scheduled on:  06-14-2012  Report to [name] at  0800 AM.  Call this number if you have problems the morning of surgery: [phone]   Remember:   Do not eat food or drink liquids:After Midnight.  .  Take these medicines the morning of surgery with A SIP OF WATER: amlodipine, omeprazole, fluoxetine   Do not wear jewelry or make up.  Do not wear lotions, powders, or perfumes.Do not wear deodorant.    Do not bring valuables to the hospital.  Contacts, dentures or bridgework may not be worn into surgery.  Leave suitcase in the car. After surgery it may be brought to your room.  For patients admitted to the hospital, checkout time is 11:00 AM the day of    discharge.     Special Instructions: CHG Shower Use Special Wash: 1/2 bottle night before surgery and 1/2 bottle morning of surgery, use regular soap on face and front and back private area. No shaving for 2 days before showers   Please read over the following fact sheets that you were given: MRSA Information, blood fact sheet, incentive spirometer fact sheet  [facility] pre op nurse phone number [phone], call if needed

## 2012-06-08 NOTE — Progress Notes (Signed)
H&P performed 06/08/12 Dictation # [id number]

## 2012-06-08 NOTE — Pre-Procedure Instructions (Signed)
Faxed micro ua results to dr Charlann Boxer, spoke with matt babish pa results received and pt ok for surgery

## 2012-06-09 NOTE — H&P (Signed)
Jordan, Lane             ACCOUNT NO.:  [id number]  MEDICAL RECORD NO.:  [id number]  LOCATION:  XRAY                         FACILITY:  [facility]  PHYSICIAN:  Madlyn Frankel. Charlann Boxer, M.D.  DATE OF BIRTH:  31-May-1946  DATE OF ADMISSION:  06/07/2012 DATE OF DISCHARGE:  06/07/2012                             HISTORY & PHYSICAL   ADMITTING DIAGNOSIS:  Medial compartment osteoarthritis, left knee.  HISTORY OF PRESENT ILLNESS:  This is a 66 year old lady with a history of medial compartment osteoarthritis of her left knee that has failed conservative management.  After discussion of treatments, benefits, risks, and options, the patient is now scheduled for unicompartmental arthroplasty, medial compartment left knee.  Note that the patient is a candidate for tranexamic acid and dexamethasone and will receive those at surgery.  She is planning on going home after surgery.  She is given her home medications of aspirin, Robaxin, iron, MiraLax, and Colace and note that her medical doctor is Dr. Kirby Funk.  PAST MEDICAL HISTORY:  Drug allergies:  None but she does get nausea with all pain medicines and needs Phenergan to help control that nausea. She is not sensitive to Zofran and it does not help her nausea.  CURRENT MEDICATIONS: 1. Amlodipine 5 mg daily. 2. Valsartan/hydrochlorothiazide 160/12.5 once a day. 3. Omeprazole 40 mg once a day. 4. Fluoxetine 10 mg once a day.  SERIOUS MEDICAL ILLNESSES:  Include hypertension, reflux, and depression.  PREVIOUS SURGERIES:  Include hysterectomy, lumbar diskectomy, cholecystectomy, and knee arthroscopy.  FAMILY HISTORY:  Positive for lung cancer, brain tumor, rheumatoid arthritis, thyroid disease.  SOCIAL HISTORY:  The patient is married.  She lives at home.  She does not smoke and does not drink and again plans on going home after surgery.  REVIEW OF SYSTEMS:  CENTRAL NERVOUS SYSTEM:  Negative for headache, blurred vision, or dizziness.   PULMONARY:  Negative for shortness breath, PND, orthopnea. CARDIOVASCULAR:  Negative for chest pain, palpitation.  GI:  Negative for ulcers, hepatitis.  GU:  Negative for urinary tract difficulty.  MUSCULOSKELETAL:  Positive as in HPI.  PHYSICAL EXAMINATION:  VITAL SIGNS:  BP 142/92, respirations 14, pulse 76 and regular. HEENT:  Head normocephalic.  Nose patent.  Ears patent.  Pupils equal, round, reactive to light.  Throat without injection. NECK:  Supple without adenopathy.  Carotids 2+ without bruit. CHEST:  Clear to auscultation.  No rales or rhonchi.  Respirations 14. HEART:  Regular rate and rhythm at 78 beats per minute without murmur. ABDOMEN:  Soft.  Active bowel sounds.  No masses or organomegaly. NEUROLOGIC:  The patient is alert and oriented to time, place, and person.  Cranial nerves II-XII grossly intact. EXTREMITIES:  Shows the left knee with a varus deformity, tenderness in the medial joint line.  Neurovascular status intact, 3 degrees from full extension, further flexion to 125.  ASSESSMENT:  Medial compartment osteoarthritis, left knee.  PLAN:  Medial unicompartmental arthroplasty, left knee.     Jaquelyn Bitter. Maurion Walkowiak, P.A.   ______________________________ Madlyn Frankel Charlann Boxer, M.D.    SJC/MEDQ  D:  06/08/2012  T:  06/08/2012  Job:  [id number]

## 2012-06-14 ENCOUNTER — Other Ambulatory Visit: Payer: Self-pay

## 2012-06-14 ENCOUNTER — Ambulatory Visit (HOSPITAL_COMMUNITY): Payer: Medicare Other | Admitting: Anesthesiology

## 2012-06-14 ENCOUNTER — Encounter (HOSPITAL_COMMUNITY): Payer: Self-pay | Admitting: *Deleted

## 2012-06-14 ENCOUNTER — Encounter (HOSPITAL_COMMUNITY): Payer: Self-pay | Admitting: Anesthesiology

## 2012-06-14 ENCOUNTER — Encounter (HOSPITAL_COMMUNITY)
Admission: RE | Disposition: A | Discharge status: Home Health | Payer: Self-pay | Source: Ambulatory Visit | Attending: Orthopedic Surgery

## 2012-06-14 ENCOUNTER — Inpatient Hospital Stay (HOSPITAL_COMMUNITY)
Admission: RE | Admit: 2012-06-14 | Discharge: 2012-06-15 | DRG: 470 | Disposition: A | Discharge status: Home Health | Payer: Medicare Other | Source: Ambulatory Visit | Attending: Orthopedic Surgery | Admitting: Orthopedic Surgery

## 2012-06-14 DIAGNOSIS — I4949 Other premature depolarization: Secondary | ICD-10-CM | POA: Diagnosis not present

## 2012-06-14 DIAGNOSIS — R072 Precordial pain: Secondary | ICD-10-CM

## 2012-06-14 DIAGNOSIS — I493 Ventricular premature depolarization: Secondary | ICD-10-CM

## 2012-06-14 DIAGNOSIS — M1710 Unilateral primary osteoarthritis, unspecified knee: Principal | ICD-10-CM | POA: Diagnosis present

## 2012-06-14 DIAGNOSIS — R000 Tachycardia, unspecified: Secondary | ICD-10-CM

## 2012-06-14 DIAGNOSIS — E876 Hypokalemia: Secondary | ICD-10-CM | POA: Diagnosis not present

## 2012-06-14 DIAGNOSIS — Z96652 Presence of left artificial knee joint: Secondary | ICD-10-CM

## 2012-06-14 DIAGNOSIS — I10 Essential (primary) hypertension: Secondary | ICD-10-CM

## 2012-06-14 HISTORY — PX: PARTIAL KNEE ARTHROPLASTY: SHX2174

## 2012-06-14 LAB — CARDIAC PANEL(CRET KIN+CKTOT+MB+TROPI)
CK, MB: 2.4 ng/mL (ref 0.3–4.0)
Total CK: 139 U/L (ref 7–177)
Troponin I: <0.3 ng/mL (ref <0.30)

## 2012-06-14 SURGERY — ARTHROPLASTY, KNEE, UNICOMPARTMENTAL
Anesthesia: Spinal | Site: Knee | Laterality: Left | Wound class: Clean

## 2012-06-14 MED ORDER — CEFAZOLIN SODIUM 1-5 GM-% IV SOLN
1.0000 g | Freq: Four times a day (QID) | INTRAVENOUS | Status: AC
  Administered 2012-06-14 (×2): 1 g via INTRAVENOUS
  Filled 2012-06-14 (×3): qty 50

## 2012-06-14 MED ORDER — PROMETHAZINE HCL 25 MG/ML IJ SOLN
INTRAMUSCULAR | Status: AC
  Filled 2012-06-14: qty 1

## 2012-06-14 MED ORDER — EPHEDRINE SULFATE 50 MG/ML IJ SOLN
INTRAMUSCULAR | Status: DC | PRN
  Administered 2012-06-14 (×2): 5 mg via INTRAVENOUS

## 2012-06-14 MED ORDER — DIPHENHYDRAMINE HCL 12.5 MG/5ML PO ELIX: 25.0000 mg | ORAL_SOLUTION | Freq: Four times a day (QID) | ORAL | Status: DC | PRN

## 2012-06-14 MED ORDER — METOPROLOL TARTRATE 1 MG/ML IV SOLN
INTRAVENOUS | Status: AC
  Filled 2012-06-14: qty 5

## 2012-06-14 MED ORDER — HYDROCHLOROTHIAZIDE 12.5 MG PO CAPS
12.5000 mg | ORAL_CAPSULE | Freq: Every day | ORAL | Status: DC
  Administered 2012-06-15: 12.5 mg via ORAL
  Filled 2012-06-14: qty 1

## 2012-06-14 MED ORDER — SENNA 8.6 MG PO TABS
1.0000 | ORAL_TABLET | Freq: Two times a day (BID) | ORAL | Status: DC
  Filled 2012-06-14: qty 1

## 2012-06-14 MED ORDER — LORAZEPAM 0.5 MG PO TABS: 0.5000 mg | ORAL_TABLET | Freq: Three times a day (TID) | ORAL | Status: DC | PRN

## 2012-06-14 MED ORDER — AMLODIPINE BESYLATE 5 MG PO TABS
5.0000 mg | ORAL_TABLET | Freq: Every day | ORAL | Status: DC
  Administered 2012-06-15: 5 mg via ORAL
  Filled 2012-06-14: qty 1

## 2012-06-14 MED ORDER — PHENOL 1.4 % MT LIQD: 1.0000 | OROMUCOSAL | Status: DC | PRN

## 2012-06-14 MED ORDER — FENTANYL CITRATE 0.05 MG/ML IJ SOLN
INTRAMUSCULAR | Status: DC | PRN
  Administered 2012-06-14: 100 ug via INTRAVENOUS

## 2012-06-14 MED ORDER — PROMETHAZINE HCL 25 MG/ML IJ SOLN
6.2500 mg | INTRAMUSCULAR | Status: DC | PRN
  Administered 2012-06-14: 6.25 mg via INTRAVENOUS

## 2012-06-14 MED ORDER — LACTATED RINGERS IV SOLN
INTRAVENOUS | Status: DC | PRN
  Administered 2012-06-14 (×2): via INTRAVENOUS

## 2012-06-14 MED ORDER — PANTOPRAZOLE SODIUM 40 MG PO TBEC
80.0000 mg | DELAYED_RELEASE_TABLET | Freq: Every day | ORAL | Status: DC
  Administered 2012-06-15: 80 mg via ORAL
  Filled 2012-06-14: qty 2

## 2012-06-14 MED ORDER — VALSARTAN-HYDROCHLOROTHIAZIDE 160-12.5 MG PO TABS: 1.0000 | ORAL_TABLET | Freq: Every day | ORAL | Status: DC

## 2012-06-14 MED ORDER — 0.9 % SODIUM CHLORIDE (POUR BTL) OPTIME
TOPICAL | Status: DC | PRN
  Administered 2012-06-14: 1000 mL

## 2012-06-14 MED ORDER — HYDROCODONE-ACETAMINOPHEN 7.5-325 MG PO TABS
1.0000 | ORAL_TABLET | ORAL | Status: DC
  Administered 2012-06-15: 1 via ORAL
  Filled 2012-06-14: qty 1

## 2012-06-14 MED ORDER — DOCUSATE SODIUM 100 MG PO CAPS
100.0000 mg | ORAL_CAPSULE | Freq: Two times a day (BID) | ORAL | Status: DC
  Filled 2012-06-14 (×4): qty 1

## 2012-06-14 MED ORDER — RIVAROXABAN 10 MG PO TABS
10.0000 mg | ORAL_TABLET | Freq: Every day | ORAL | Status: DC
  Administered 2012-06-15: 10 mg via ORAL
  Filled 2012-06-14 (×2): qty 1

## 2012-06-14 MED ORDER — POLYETHYLENE GLYCOL 3350 17 G PO PACK
17.0000 g | PACK | Freq: Every day | ORAL | Status: DC | PRN
  Filled 2012-06-14: qty 1

## 2012-06-14 MED ORDER — IRBESARTAN 150 MG PO TABS
150.0000 mg | ORAL_TABLET | Freq: Every day | ORAL | Status: DC
  Administered 2012-06-14 – 2012-06-15 (×2): 150 mg via ORAL
  Filled 2012-06-14 (×4): qty 1

## 2012-06-14 MED ORDER — FLUOXETINE HCL 10 MG PO CAPS
10.0000 mg | ORAL_CAPSULE | Freq: Every day | ORAL | Status: DC
  Administered 2012-06-15: 10 mg via ORAL
  Filled 2012-06-14: qty 1

## 2012-06-14 MED ORDER — CEFAZOLIN SODIUM-DEXTROSE 2-3 GM-% IV SOLR
2.0000 g | INTRAVENOUS | Status: AC
  Administered 2012-06-14: 2 g via INTRAVENOUS

## 2012-06-14 MED ORDER — BUPIVACAINE-EPINEPHRINE PF 0.25-1:200000 % IJ SOLN
INTRAMUSCULAR | Status: AC
  Filled 2012-06-14: qty 30

## 2012-06-14 MED ORDER — TRANEXAMIC ACID 100 MG/ML IV SOLN
1180.0000 mg | INTRAVENOUS | Status: DC
  Filled 2012-06-14: qty 11.8

## 2012-06-14 MED ORDER — SODIUM CHLORIDE 0.9 % IV SOLN
INTRAVENOUS | Status: DC
  Administered 2012-06-14 – 2012-06-15 (×2): via INTRAVENOUS
  Filled 2012-06-14 (×5): qty 1000

## 2012-06-14 MED ORDER — METHOCARBAMOL 500 MG PO TABS: 500.0000 mg | ORAL_TABLET | Freq: Four times a day (QID) | ORAL | Status: DC | PRN

## 2012-06-14 MED ORDER — KETAMINE HCL 10 MG/ML IJ SOLN
INTRAMUSCULAR | Status: DC | PRN
  Administered 2012-06-14 (×4): 10 mg via INTRAVENOUS

## 2012-06-14 MED ORDER — MIDAZOLAM HCL 5 MG/5ML IJ SOLN
INTRAMUSCULAR | Status: DC | PRN
  Administered 2012-06-14 (×3): 1 mg via INTRAVENOUS

## 2012-06-14 MED ORDER — ONDANSETRON HCL 4 MG PO TABS: 4.0000 mg | ORAL_TABLET | Freq: Four times a day (QID) | ORAL | Status: DC | PRN

## 2012-06-14 MED ORDER — BUPIVACAINE-EPINEPHRINE 0.25% -1:200000 IJ SOLN
INTRAMUSCULAR | Status: DC | PRN
  Administered 2012-06-14: 30 mL

## 2012-06-14 MED ORDER — HYDROMORPHONE HCL PF 1 MG/ML IJ SOLN: 0.2500 mg | INTRAMUSCULAR | Status: DC | PRN

## 2012-06-14 MED ORDER — SODIUM CHLORIDE 0.9 % IV SOLN
1180.0000 mg | INTRAVENOUS | Status: DC | PRN
  Administered 2012-06-14: 1180 mg via INTRAVENOUS

## 2012-06-14 MED ORDER — KETOROLAC TROMETHAMINE 30 MG/ML IJ SOLN
INTRAMUSCULAR | Status: DC | PRN
  Administered 2012-06-14: 30 mg

## 2012-06-14 MED ORDER — METHOCARBAMOL 100 MG/ML IJ SOLN
500.0000 mg | Freq: Four times a day (QID) | INTRAVENOUS | Status: DC | PRN
  Filled 2012-06-14: qty 5

## 2012-06-14 MED ORDER — CEFAZOLIN SODIUM-DEXTROSE 2-3 GM-% IV SOLR
INTRAVENOUS | Status: AC
  Filled 2012-06-14: qty 50

## 2012-06-14 MED ORDER — ACETAMINOPHEN 10 MG/ML IV SOLN
INTRAVENOUS | Status: DC | PRN
  Administered 2012-06-14: 1000 mg via INTRAVENOUS

## 2012-06-14 MED ORDER — LACTATED RINGERS IV SOLN
INTRAVENOUS | Status: DC
  Administered 2012-06-14: 13:00:00 via INTRAVENOUS

## 2012-06-14 MED ORDER — METOPROLOL TARTRATE 1 MG/ML IV SOLN
5.0000 mg | Freq: Once | INTRAVENOUS | Status: AC
  Administered 2012-06-14: 5 mg via INTRAVENOUS

## 2012-06-14 MED ORDER — ONDANSETRON HCL 4 MG/2ML IJ SOLN: 4.0000 mg | Freq: Four times a day (QID) | INTRAMUSCULAR | Status: DC | PRN

## 2012-06-14 MED ORDER — DEXAMETHASONE SODIUM PHOSPHATE 10 MG/ML IJ SOLN
10.0000 mg | Freq: Once | INTRAMUSCULAR | Status: AC
  Administered 2012-06-14: 10 mg via INTRAVENOUS

## 2012-06-14 MED ORDER — HYDROMORPHONE HCL PF 1 MG/ML IJ SOLN
0.2000 mg | INTRAMUSCULAR | Status: DC | PRN
  Administered 2012-06-14 – 2012-06-15 (×5): 0.5 mg via INTRAVENOUS
  Administered 2012-06-15: 0.6 mg via INTRAVENOUS
  Filled 2012-06-14 (×6): qty 1

## 2012-06-14 MED ORDER — ACETAMINOPHEN 10 MG/ML IV SOLN
INTRAVENOUS | Status: AC
  Filled 2012-06-14: qty 100

## 2012-06-14 MED ORDER — ALUM & MAG HYDROXIDE-SIMETH 200-200-20 MG/5ML PO SUSP: 30.0000 mL | ORAL | Status: DC | PRN

## 2012-06-14 MED ORDER — METOPROLOL TARTRATE 12.5 MG HALF TABLET
12.5000 mg | ORAL_TABLET | Freq: Two times a day (BID) | ORAL | Status: DC
  Administered 2012-06-14 – 2012-06-15 (×2): 12.5 mg via ORAL
  Filled 2012-06-14 (×6): qty 1

## 2012-06-14 MED ORDER — PROPOFOL 10 MG/ML IV EMUL
INTRAVENOUS | Status: DC | PRN
  Administered 2012-06-14: 75 ug/kg/min via INTRAVENOUS

## 2012-06-14 MED ORDER — MENTHOL 3 MG MT LOZG: 1.0000 | LOZENGE | OROMUCOSAL | Status: DC | PRN

## 2012-06-14 MED ORDER — KETOROLAC TROMETHAMINE 30 MG/ML IJ SOLN
INTRAMUSCULAR | Status: AC
  Filled 2012-06-14: qty 1

## 2012-06-14 MED ORDER — FERROUS SULFATE 325 (65 FE) MG PO TABS
325.0000 mg | ORAL_TABLET | Freq: Three times a day (TID) | ORAL | Status: DC
  Administered 2012-06-14 – 2012-06-15 (×3): 325 mg via ORAL
  Filled 2012-06-14 (×6): qty 1

## 2012-06-14 MED ORDER — ZOLPIDEM TARTRATE 5 MG PO TABS: 5.0000 mg | ORAL_TABLET | Freq: Every evening | ORAL | Status: DC | PRN

## 2012-06-14 SURGICAL SUPPLY — 50 items
ADH SKN CLS APL DERMABOND .7 (GAUZE/BANDAGES/DRESSINGS) ×1
BAG SPEC THK2 15X12 ZIP CLS (MISCELLANEOUS) ×1
BAG ZIPLOCK 12X15 (MISCELLANEOUS) ×2 IMPLANT
BANDAGE ELASTIC 6 VELCRO ST LF (GAUZE/BANDAGES/DRESSINGS) ×2 IMPLANT
BANDAGE ESMARK 6X9 LF (GAUZE/BANDAGES/DRESSINGS) ×1 IMPLANT
BLADE SAW RECIPROCATING 77.5 (BLADE) ×2 IMPLANT
BLADE SAW SGTL 13.0X1.19X90.0M (BLADE) ×2 IMPLANT
BNDG CMPR 9X6 STRL LF SNTH (GAUZE/BANDAGES/DRESSINGS) ×1
BNDG ESMARK 6X9 LF (GAUZE/BANDAGES/DRESSINGS) ×2
BOWL SMART MIX CTS (DISPOSABLE) ×2 IMPLANT
CEMENT HV SMART SET (Cement) ×2 IMPLANT
CLOTH BEACON ORANGE TIMEOUT ST (SAFETY) ×2 IMPLANT
COVER SURGICAL LIGHT HANDLE (MISCELLANEOUS) ×2 IMPLANT
CUFF TOURN SGL QUICK 34 (TOURNIQUET CUFF) ×2
CUFF TRNQT CYL 34X4X40X1 (TOURNIQUET CUFF) ×1 IMPLANT
DERMABOND ADVANCED (GAUZE/BANDAGES/DRESSINGS) ×1
DERMABOND ADVANCED .7 DNX12 (GAUZE/BANDAGES/DRESSINGS) ×1 IMPLANT
DRAPE EXTREMITY T 121X128X90 (DRAPE) ×2 IMPLANT
DRAPE POUCH INSTRU U-SHP 10X18 (DRAPES) ×2 IMPLANT
DRSG AQUACEL AG ADV 3.5X 6 (GAUZE/BANDAGES/DRESSINGS) ×2 IMPLANT
DRSG TEGADERM 4X4.75 (GAUZE/BANDAGES/DRESSINGS) ×2 IMPLANT
DURAPREP 26ML APPLICATOR (WOUND CARE) ×2 IMPLANT
ELECT REM PT RETURN 9FT ADLT (ELECTROSURGICAL) ×2
ELECTRODE REM PT RTRN 9FT ADLT (ELECTROSURGICAL) ×1 IMPLANT
EVACUATOR 1/8 PVC DRAIN (DRAIN) ×2 IMPLANT
FACESHIELD LNG OPTICON STERILE (SAFETY) ×8 IMPLANT
GAUZE SPONGE 2X2 8PLY STRL LF (GAUZE/BANDAGES/DRESSINGS) ×1 IMPLANT
GLOVE BIOGEL PI IND STRL 7.5 (GLOVE) ×1 IMPLANT
GLOVE BIOGEL PI IND STRL 8 (GLOVE) IMPLANT
GLOVE BIOGEL PI INDICATOR 7.5 (GLOVE) ×1
GLOVE BIOGEL PI INDICATOR 8 (GLOVE)
GLOVE ORTHO TXT STRL SZ7.5 (GLOVE) ×4 IMPLANT
GOWN BRE IMP PREV XXLGXLNG (GOWN DISPOSABLE) ×4 IMPLANT
GOWN STRL NON-REIN LRG LVL3 (GOWN DISPOSABLE) ×2 IMPLANT
KIT BASIN OR (CUSTOM PROCEDURE TRAY) ×2 IMPLANT
LEGGING LITHOTOMY PAIR STRL (DRAPES) ×2 IMPLANT
MANIFOLD NEPTUNE II (INSTRUMENTS) ×2 IMPLANT
NDL SAFETY ECLIPSE 18X1.5 (NEEDLE) ×1 IMPLANT
NEEDLE HYPO 18GX1.5 SHARP (NEEDLE) ×2
PACK TOTAL JOINT (CUSTOM PROCEDURE TRAY) ×2 IMPLANT
POSITIONER SURGICAL ARM (MISCELLANEOUS) ×2 IMPLANT
SPONGE GAUZE 2X2 STER 10/PKG (GAUZE/BANDAGES/DRESSINGS) ×1
SUCTION FRAZIER TIP 10 FR DISP (SUCTIONS) ×2 IMPLANT
SUT MNCRL AB 4-0 PS2 18 (SUTURE) ×2 IMPLANT
SUT VIC AB 1 CT1 36 (SUTURE) ×4 IMPLANT
SUT VIC AB 2-0 CT1 27 (SUTURE) ×4
SUT VIC AB 2-0 CT1 TAPERPNT 27 (SUTURE) ×2 IMPLANT
SYR 50ML LL SCALE MARK (SYRINGE) ×2 IMPLANT
TOWEL OR 17X26 10 PK STRL BLUE (TOWEL DISPOSABLE) ×4 IMPLANT
TRAY FOLEY CATH 14FRSI W/METER (CATHETERS) ×2 IMPLANT

## 2012-06-14 NOTE — Anesthesia Procedure Notes (Signed)

## 2012-06-14 NOTE — Op Note (Signed)
NAME: TOBEY LIPPARD    MEDICAL RECORD NO.: [id number]   FACILITY: [facility]   DATE OF BIRTH: 1946-09-03  PHYSICIAN: Madlyn Frankel. Charlann Boxer, M.D.    DATE OF PROCEDURE: 06/14/2012    OPERATIVE REPORT   PREOPERATIVE DIAGNOSIS: Left knee medial compartment osteoarthritis.   POSTOPERATIVE DIAGNOSIS: Left knee medial compartment osteoarthritis.  PROCEDURE: Left partial knee replacement utilizing Biomet Oxford knee  component, size small twin pegged femur, a left medial size AA tibial tray with a size 5 insert.   SURGEON: Madlyn Frankel. Charlann Boxer, M.D.   ASSISTANT: Leilani Able, PAC.  Please note that Mr. Chabon was present for the entirety of the case,  utilized for preoperative positioning, perioperative retractor  management, general facilitation of the case and primary wound closure.   ANESTHESIA: Spinal.   SPECIMENS: None.   COMPLICATIONS: None.  DRAINS: 1 medium HV   TOURNIQUET TIME: 44 minutes at 250 mmHg.   INDICATIONS FOR PROCEDURE: The patient is a 66 yo female patient of mine who presented for evaluation of left knee pain.  They presented with primary complaints of pain on the medial side of their knee. Radiographs revealed advanced medial compartment arthritis with specifically an antero-medial wear pattern.  There was bone on bone changes noted with subchondral sclerosis and osteophytes present. The patient has had progressive problems failing to respond to conservative measures of medications, injections and activity modification. Risks of infection, DVT, component failure, need for future revision surgery were all discussed and reviewed.  Consent was obtained for benefit of pain relief.   PROCEDURE IN DETAIL: The patient was brought to the operative theater.  Once adequate anesthesia, preoperative antibiotics, 2gm Ancef administered, the patient was positioned in supine position with a left thigh tourniquet  placed. The left lower extremity was prepped and draped in sterile  fashion  with the leg on the Oxford leg holder.  The leg was allowed to flex to 120 degrees. A time-out  was performed identifying the patient, planned procedure, and extremity.  The leg was exsanguinated, tourniquet elevated to 250 mmHg. A midline  incision was made from the proximal pole of the patella to the tibial tubercle. A  soft tissue plane was created and partial median arthrotomy was then  made to allow for subluxation of the patella. Following initial synovectomy and  debridement, the osteophytes were removed off the medial aspect of the  knee.   Attention was first directed to the tibia. The tibial  extramedullary guide was positioned over the anterior crest of the tibia  and pinned into position, and using a measured resection guide from the  Oxford system, a resection was made off the proximal tibia with a size 3 cut. First  the reciprocating saw along the medial aspect of the tibial spines, then the oscillating saw.    At this point, I sized this cut surface seem to be best fit for a size AA tibial tray.  With the retractors out of the wound and the knee held at 90 degrees the 4 feeler gauge had appropriate tension on the medial ligament.   At this point, the femoral canal was opened with a drill and the  intramedullary rod passed. Then using the guide for a small resection off  the posterior aspect of the femur was positioned over the mid portion of the medial femoral condyle.  The orientation was set using the guide that mates the femoral guide to the intramedullary rod.  The 2 drill holes were made into the distal  femur.  The posterior guide was then impacted into place and the posterior  femoral cut made.  At this point, I milled the distal femur with a size 4 spigot in place. At this point, we did a trial reduction of the small femur, size AA tibial tray and a 4 feeler gauge. At 90 degrees of  flexion and at 20 degrees of flexion the knee had symmetric tension on  the ligaments.     Given these findings, the trial femoral component was removed. Final preparation of tibia was carried out by pinning it in position. Then  using a reciprocating saw I removed bone for the keel. Further bone was  removed with an osteotome.  Trial reduction was now carried out with the small femur, the AA tibia, and a 5 lollipop insert. The balance of the  ligaments appeared to be symmetric at 20 degrees and 90 degrees. Given  all these findings, the trial components were removed.   Cement was mixed. The final components were opened. The knee was irrigated with  normal saline solution. Then final debridements of the  soft tissue was carried out, I also drilled the sclerotic bone with a drill.  The final components were cemented with a single batch of cement in a  two-stage technique with the tibial component cemented first. The knee  was then brought  to 45 degrees of flexion with a 5 feeler gauge, held with pressure for a minute and half.  After this the femoral component was cemented in place.  The knee was again held at 45 degrees of flexion while the cement fully cured.  Excess cement was removed throughout the knee. Tourniquet was let down  after 44 minutes. After the cement had fully cured and excessive cement  was removed throughout the knee there was no visualized cement present.   The final 5 insert was chosen and snapped into position. We re-irrigated  the knee. I placed a medium Hemovac drain deep. The extensor mechanism  was then reapproximated using a #1 Vicryl and #0 V-lock sutures with the knee in flexion. The  remaining wound was closed with 2-0 Vicryl and a running 4-0 Monocryl.  The knee was cleaned, dried, and dressed sterilely using Dermabond and  Aquacel dressing. The drain site was dressed separately. The patient  was brought to the recovery room, Ace wrap in place, tolerating the  procedure well. He will be in the hospital for overnight observation.  We will  initiate physical therapy and progress to ambulate.     Madlyn Frankel Charlann Boxer, M.D.

## 2012-06-14 NOTE — Care Management Note (Signed)
    Page 1 of 2   06/15/2012     12:41:16 PM   CARE MANAGEMENT NOTE 06/15/2012  Patient:  Jordan Lane,Jordan Lane   Account Number:  [id number]  Date Initiated:  06/14/2012  Documentation initiated by:  Lanier Clam  Subjective/Objective Assessment:   ADMITTED W/TACHYCARDIA,& PVC'S S/P L UNI KNEE.RECENT SX ON 06/08/12 L UNICOMPARTMENTAL MEDIAL KNEE.     Action/Plan:   FROM HOME W/SPOUSE.   Anticipated DC Date:  06/15/2012   Anticipated DC Plan:  HOME W HOME HEALTH SERVICES      DC Planning Services  CM consult      Choice offered to / List presented to:  C-1 Patient   DME arranged  BEDSIDE COMMODE      DME agency  [facility]     [facility] arranged  HH-2 PT  HH-3 OT      [facility] agency  [facility]   Status of service:  Completed, signed off Medicare Important Message given?   (If response is "NO", the following Medicare IM given date fields will be blank) Date Medicare IM given:   Date Additional Medicare IM given:    Discharge Disposition:  HOME W HOME HEALTH SERVICES  Per UR Regulation:  Reviewed for med. necessity/level of care/duration of stay  If discussed at Long Length of Stay Meetings, dates discussed:    Comments:  06/14/12 [facility] RN,BSN NCM [phone]

## 2012-06-14 NOTE — H&P (View-Only) (Signed)
NAME:  Jordan Lane, Jordan Lane             ACCOUNT NO.:  [id number]  MEDICAL RECORD NO.:  [id number]  LOCATION:  XRAY                         FACILITY:  [facility]  PHYSICIAN:  Matthew D. Olin, M.D.  DATE OF BIRTH:  07/10/1946  DATE OF ADMISSION:  06/07/2012 DATE OF DISCHARGE:  06/07/2012                             HISTORY & PHYSICAL   ADMITTING DIAGNOSIS:  Medial compartment osteoarthritis, left knee.  HISTORY OF PRESENT ILLNESS:  This is a 66-year-old lady with a history of medial compartment osteoarthritis of her left knee that has failed conservative management.  After discussion of treatments, benefits, risks, and options, the patient is now scheduled for unicompartmental arthroplasty, medial compartment left knee.  Note that the patient is a candidate for tranexamic acid and dexamethasone and will receive those at surgery.  She is planning on going home after surgery.  She is given her home medications of aspirin, Robaxin, iron, MiraLax, and Colace and note that her medical doctor is Dr. John Griffin.  PAST MEDICAL HISTORY:  Drug allergies:  None but she does get nausea with all pain medicines and needs Phenergan to help control that nausea. She is not sensitive to Zofran and it does not help her nausea.  CURRENT MEDICATIONS: 1. Amlodipine 5 mg daily. 2. Valsartan/hydrochlorothiazide 160/12.5 once a day. 3. Omeprazole 40 mg once a day. 4. Fluoxetine 10 mg once a day.  SERIOUS MEDICAL ILLNESSES:  Include hypertension, reflux, and depression.  PREVIOUS SURGERIES:  Include hysterectomy, lumbar diskectomy, cholecystectomy, and knee arthroscopy.  FAMILY HISTORY:  Positive for lung cancer, brain tumor, rheumatoid arthritis, thyroid disease.  SOCIAL HISTORY:  The patient is married.  She lives at home.  She does not smoke and does not drink and again plans on going home after surgery.  REVIEW OF SYSTEMS:  CENTRAL NERVOUS SYSTEM:  Negative for headache, blurred vision, or dizziness.   PULMONARY:  Negative for shortness breath, PND, orthopnea. CARDIOVASCULAR:  Negative for chest pain, palpitation.  GI:  Negative for ulcers, hepatitis.  GU:  Negative for urinary tract difficulty.  MUSCULOSKELETAL:  Positive as in HPI.  PHYSICAL EXAMINATION:  VITAL SIGNS:  BP 142/92, respirations 14, pulse 76 and regular. HEENT:  Head normocephalic.  Nose patent.  Ears patent.  Pupils equal, round, reactive to light.  Throat without injection. NECK:  Supple without adenopathy.  Carotids 2+ without bruit. CHEST:  Clear to auscultation.  No rales or rhonchi.  Respirations 14. HEART:  Regular rate and rhythm at 78 beats per minute without murmur. ABDOMEN:  Soft.  Active bowel sounds.  No masses or organomegaly. NEUROLOGIC:  The patient is alert and oriented to time, place, and person.  Cranial nerves II-XII grossly intact. EXTREMITIES:  Shows the left knee with a varus deformity, tenderness in the medial joint line.  Neurovascular status intact, 3 degrees from full extension, further flexion to 125.  ASSESSMENT:  Medial compartment osteoarthritis, left knee.  PLAN:  Medial unicompartmental arthroplasty, left knee.     Geralda Baumgardner J. Brittinee Risk, P.A.   ______________________________ Matthew D. Olin, M.D.    SJC/MEDQ  D:  06/08/2012  T:  06/08/2012  Job:  [id number] 

## 2012-06-14 NOTE — Progress Notes (Signed)
  Echocardiogram 2D Echocardiogram has been performed.  Elva Mauro 06/14/2012, 3:07 PM

## 2012-06-14 NOTE — Interval H&P Note (Signed)
History and Physical Interval Note:  06/14/2012 8:37 AM  Jordan Lane  has presented today for surgery, with the diagnosis of Left Knee Medial Compartmental Osteoarthritis  The various methods of treatment have been discussed with the patient and family. After consideration of risks, benefits and other options for treatment, the patient has consented to  Procedure(s) (LRB):LEFT UNICOMPARTMENTAL KNEE (Left) as a surgical intervention .  The patient's history has been reviewed, patient examined, no change in status, stable for surgery.  I have reviewed the patients' chart and labs.  Questions were answered to the patient's satisfaction.     Shelda Pal

## 2012-06-14 NOTE — Transfer of Care (Signed)
Immediate Anesthesia Transfer of Care Note  Patient: Jordan Lane  Procedure(s) Performed: Procedure(s) (LRB): UNICOMPARTMENTAL KNEE (Left)  Patient Location: PACU  Anesthesia Type: Spinal  Level of Consciousness: awake, oriented and sedated  Airway & Oxygen Therapy: Patient Spontanous Breathing, Patient connected to face mask oxygen and denies any pain.  Post-op Assessment: Report given to PACU RN, Post -op Vital signs reviewed and stable and T10  Post vital signs: Reviewed and stable  Complications: No apparent anesthesia complications and cardiac consult obtained for ST changes and increased heart rate.

## 2012-06-14 NOTE — Anesthesia Preprocedure Evaluation (Signed)
Anesthesia Evaluation  Patient identified by MRN, date of birth, ID band Patient awake    Reviewed: Allergy & Precautions, H&P , NPO status , Patient's Chart, lab work & pertinent test results  Airway Mallampati: II TM Distance: <3 FB Neck ROM: Full    Dental No notable dental hx.    Pulmonary neg pulmonary ROS,  breath sounds clear to auscultation  Pulmonary exam normal       Cardiovascular hypertension, Pt. on medications Rhythm:Regular Rate:Normal     Neuro/Psych negative neurological ROS  negative psych ROS   GI/Hepatic negative GI ROS, Neg liver ROS,   Endo/Other  negative endocrine ROS  Renal/GU negative Renal ROS  negative genitourinary   Musculoskeletal negative musculoskeletal ROS (+)   Abdominal   Peds negative pediatric ROS (+)  Hematology negative hematology ROS (+)   Anesthesia Other Findings   Reproductive/Obstetrics negative OB ROS                           Anesthesia Physical Anesthesia Plan  ASA: II  Anesthesia Plan: Spinal   Post-op Pain Management:    Induction:   Airway Management Planned: Simple Face Mask  Additional Equipment:   Intra-op Plan:   Post-operative Plan:   Informed Consent: I have reviewed the patients History and Physical, chart, labs and discussed the procedure including the risks, benefits and alternatives for the proposed anesthesia with the patient or authorized representative who has indicated his/her understanding and acceptance.     Plan Discussed with: CRNA  Anesthesia Plan Comments:         Anesthesia Quick Evaluation

## 2012-06-14 NOTE — Consult Note (Signed)
CARDIOLOGY CONSULT NOTE    Patient ID: Jordan Lane MRN: [id number] DOB/AGE: 02-16-1946 66 y.o.  Admit date: 06/14/2012 Referring Physician:  Charlann Lane Primary Physician: Jordan Mountain, MD Primary Cardiologist:  Jordan Lane Reason for Consultation: Tachycardia and PVC;s post op  Active Problems:  * No active hospital problems. *    HPI:  66 o with history of HTN.  Post right  knee partial replacement.  After tourniquet released had tachycardia and PVC;s.  In PACU no chest pain or dyspnea. Occasional PVC.  No ischemic Changes on ECG.  No history of heart problems, CHF, syncope or arrhythmia.  No issues or marked blood loss during surgery. Post op pain control seems adequate.  Did no take All her BP meds preop.  Spinal still effective and has not worn off.  cholycystectomy 2009 and left knee surgery 2010 without difficulty or cardiac complications  @ROS @ All other systems reviewed and negative except as noted above  Past Medical History  Diagnosis Date  . Uterine prolapse     no uterus hysterectomy  . Hypertension   . Fibroadenoma of breast     Right  . Reflux   . GERD (gastroesophageal reflux disease)   . Arthritis   . History of blood transfusion 48 yrs ago    Family History  Problem Relation Age of Onset  . Hypertension Father   . Diabetes Father   . Breast cancer Paternal Aunt     Age 71's  . Breast cancer Paternal Grandmother     Age 55  . Other Mother     Brain tumor    History   Social History  . Marital Status: Married    Spouse Name: Jordan Lane    Number of Children: Jordan Lane  . Years of Education: Jordan Lane   Occupational History  . Not on file.   Social History Main Topics  . Smoking status: Never Smoker   . Smokeless tobacco: Never Used  . Alcohol Use: No  . Drug Use: No  . Sexually Active: Yes    Birth Control/ Protection: Surgical   Other Topics Concern  . Not on file   Social History Narrative  . No narrative on file    Past Surgical History    Procedure Date  . Knee surgery left 2010, right 2013    Arthroscopic  . Herniated disc 1987    lower back   . Abdominal hysterectomy 1984    TAH  . Cholecystectomy 2009        .  ceFAZolin (ANCEF) IV  2 g Intravenous 60 min Pre-Op  . dexamethasone  10 mg Intravenous Once  . metoprolol      . tranexamic acid (CYLOKAPRON) IVPB (ORTHO)  1,180 mg Intravenous To OR      . lactated ringers      Physical Exam: Blood pressure 138/69, pulse 90, temperature 97.7 F (36.5 C), resp. rate 12, height 5' 2.6" (1.59 m), weight 78.5 kg (173 lb 1 oz), SpO2 97.00%.    Affect appropriate Healthy:  appears stated age HEENT: normal Neck supple with no adenopathy JVP normal no bruits no thyromegaly Lungs clear with no wheezing and good diaphragmatic motion Heart:  S1/S2 no murmur, no rub, gallop or click PMI normal Abdomen: benighn, BS positve, no tenderness, no AAA no bruit.  No HSM or HJR Distal pulses intact with no bruits No edema Neuro non-focal Skin warm and dry No muscular weakness S/P right knee surgery  Labs:   Lab Results  Component  Value Date   WBC 10.8* 06/07/2012   HGB 13.2 06/07/2012   HCT 39.7 06/07/2012   MCV 90.0 06/07/2012   PLT 334 06/07/2012    Lab 06/07/12 1530  NA 141  K 3.5  CL 103  CO2 28  BUN 15  CREATININE 0.77  CALCIUM 9.5  PROT 7.2  BILITOT 0.4  ALKPHOS 81  ALT 20  AST 26  GLUCOSE 103*   Lab Results  Component Value Date   CKTOTAL 139 06/14/2012   CKMB 2.4 06/14/2012   TROPONINI <0.30 06/14/2012     Radiology: Dg Chest 2 View  06/07/2012  *RADIOLOGY REPORT*  Clinical Data: Preoperative evaluation.  Controlled hypertension.  CHEST - 2 VIEW  Comparison: 10/29/2005.  Findings: There is normal appearance of the cardiac silhouette. There is slight tortuosity of descending thoracic aorta. Mediastinal and hilar contours appear stable.  No pulmonary infiltrates or nodules were evident.  Density projecting in the left base on PA image is felt to represent  overlap of vascular structures and the anterior aspect of the left seventh rib. No pleural abnormality is evident.  Minimal degenerative spondylosis changes are seen.  IMPRESSION: No acute or active cardiopulmonary or pleural abnormalities are evident.  Density projecting in left base on PA image felt to represent overlap of bone and vascular structures.  Original Report Authenticated By: Jordan Lane, M.D.    EKG:  NSR rate 98 PVC;s nonspecfic T wave changes   ASSESSMENT AND PLAN:  Tachycardia:  Related to surgery and bolus volume load after tourniquet release.  5mg  iv lopresser given in PACU   And will start lopresser 12.5 bid.  No evidence of acute coronary syndrome with no chest pain and POC negative Check echo BP:  Hold diuretic and norvasc  Substitute beta blocker and contiue ARB Ortho:  Routine post op DVT prophylaxis and pain control  Admit to telemetry bed  Signed: Charlton Lane 06/14/2012, 12:43 PM

## 2012-06-14 NOTE — Progress Notes (Signed)
15 minutes post tourniquet release, pt developed tachycardia, with ST depression in anterolateral leads. No c/o chest pain, SOB, or nausea. 12 lead obtained, cardiac enzyme panel will be run and cardiology will be consulted.  Will go to telemetry overnight.

## 2012-06-14 NOTE — Anesthesia Postprocedure Evaluation (Signed)
  Anesthesia Post-op Note  Patient: Jordan Lane  Procedure(s) Performed: Procedure(s) (LRB): UNICOMPARTMENTAL KNEE (Left)  Patient Location: PACU  Anesthesia Type: General  Level of Consciousness: awake and alert   Airway and Oxygen Therapy: Patient Spontanous Breathing  Post-op Pain: mild  Post-op Assessment: Post-op Vital signs reviewed, Patient's Cardiovascular Status Stable, Respiratory Function Stable, Patent Airway and No signs of Nausea or vomiting  Post-op Vital Signs: stable  Complications: No apparent anesthesia complications

## 2012-06-15 ENCOUNTER — Encounter (HOSPITAL_COMMUNITY): Payer: Self-pay | Admitting: Orthopedic Surgery

## 2012-06-15 DIAGNOSIS — Z96652 Presence of left artificial knee joint: Secondary | ICD-10-CM

## 2012-06-15 DIAGNOSIS — R000 Tachycardia, unspecified: Secondary | ICD-10-CM

## 2012-06-15 LAB — BASIC METABOLIC PANEL
Calcium: 8.6 mg/dL (ref 8.4–10.5)
GFR calc Af Amer: >90 mL/min (ref >90)
GFR calc non Af Amer: 90 mL/min — ABNORMAL LOW (ref >90)
Potassium: 3.4 mEq/L — ABNORMAL LOW (ref 3.5–5.1)
Sodium: 140 mEq/L (ref 135–145)

## 2012-06-15 LAB — CBC
MCHC: 33.9 g/dL (ref 30.0–36.0)
RDW: 13.2 % (ref 11.5–15.5)

## 2012-06-15 MED ORDER — HYDROCODONE-ACETAMINOPHEN 7.5-325 MG PO TABS: 1.0000 | ORAL_TABLET | ORAL | Status: DC

## 2012-06-15 MED ORDER — METHOCARBAMOL 500 MG PO TABS: 500.0000 mg | ORAL_TABLET | Freq: Four times a day (QID) | ORAL | Status: AC | PRN

## 2012-06-15 MED ORDER — PROMETHAZINE HCL 25 MG PO TABS
12.5000 mg | ORAL_TABLET | Freq: Four times a day (QID) | ORAL | Status: DC | PRN
  Administered 2012-06-15: 25 mg via ORAL
  Filled 2012-06-15: qty 1

## 2012-06-15 MED ORDER — PROMETHAZINE HCL 12.5 MG PO TABS: 12.5000 mg | ORAL_TABLET | Freq: Four times a day (QID) | ORAL | Status: DC | PRN

## 2012-06-15 MED ORDER — DSS 100 MG PO CAPS: 100.0000 mg | ORAL_CAPSULE | Freq: Two times a day (BID) | ORAL | Status: AC

## 2012-06-15 MED ORDER — METOPROLOL TARTRATE 12.5 MG HALF TABLET: 12.5000 mg | ORAL_TABLET | Freq: Two times a day (BID) | ORAL | Status: DC

## 2012-06-15 MED ORDER — IRBESARTAN 150 MG PO TABS: 150.0000 mg | ORAL_TABLET | Freq: Every day | ORAL | Status: DC

## 2012-06-15 MED ORDER — POLYETHYLENE GLYCOL 3350 17 G PO PACK: 17.0000 g | PACK | Freq: Every day | ORAL | Status: AC | PRN

## 2012-06-15 MED ORDER — HYDROCODONE-ACETAMINOPHEN 5-325 MG PO TABS: 1.0000 | ORAL_TABLET | ORAL | Status: AC | PRN

## 2012-06-15 MED ORDER — ASPIRIN EC 325 MG PO TBEC: 325.0000 mg | DELAYED_RELEASE_TABLET | Freq: Two times a day (BID) | ORAL | Status: AC

## 2012-06-15 MED ORDER — FERROUS SULFATE 325 (65 FE) MG PO TABS: 325.0000 mg | ORAL_TABLET | Freq: Three times a day (TID) | ORAL | Status: DC

## 2012-06-15 NOTE — Progress Notes (Signed)
Physical Therapy Treatment Patient Details Name: Jordan Lane MRN: [id number] DOB: Apr 25, 1946 Today's Date: 06/15/2012 Time: 7829-5621 PT Time Calculation (min): 19 min  PT Assessment / Plan / Recommendation Comments on Treatment Session  Pt progressing well with ambulation and stair training.  Ready for D/C today.     Follow Up Recommendations  Home health PT    Barriers to Discharge        Equipment Recommendations  3 in 1 bedside comode    Recommendations for Other Services    Frequency 7X/week   Plan Discharge plan remains appropriate    Precautions / Restrictions Precautions Precautions: Knee Precaution Comments: Did not use KI, pt able to perform SLR with <10 deg lag.  Restrictions Weight Bearing Restrictions: Yes Other Position/Activity Restrictions: WBAT   Pertinent Vitals/Pain 4/10    Mobility  Bed Mobility Bed Mobility: Supine to Sit Supine to Sit: 5: Supervision Details for Bed Mobility Assistance: Supervision and cues for safety.  Transfers Transfers: Sit to Stand;Stand to Sit Sit to Stand: 5: Supervision;From bed Stand to Sit: 5: Supervision;With armrests;To chair/3-in-1 Details for Transfer Assistance: Supervision for safety with cues for hand placement and LE management.  Ambulation/Gait Ambulation/Gait Assistance: 4: Min guard Ambulation Distance (Feet): 80 Feet Assistive device: Rolling walker Ambulation/Gait Assistance Details: Cues for sequencing/technique and for upright posture.  Gait Pattern: Step-to pattern;Decreased stance time - left;Decreased step length - right;Trunk flexed Gait velocity: decreased Stairs: Yes Stairs Assistance: 4: Min assist Stairs Assistance Details (indicate cue type and reason): Cues for sequencing/technique with two handrails.  Educated husband on assisting pt.  Stair Management Technique: Two rails;Step to pattern;Forwards Number of Stairs: 4  Wheelchair Mobility Wheelchair Mobility: No    Exercises      PT Diagnosis:    PT Problem List:   PT Treatment Interventions:     PT Goals Acute Rehab PT Goals PT Goal Formulation: With patient Time For Goal Achievement: 06/17/12 Potential to Achieve Goals: Good Pt will go Sit to Stand: with supervision PT Goal: Sit to Stand - Progress: Met Pt will go Stand to Sit: with supervision PT Goal: Stand to Sit - Progress: Met Pt will Ambulate: 51 - 150 feet;with supervision;with least restrictive assistive device PT Goal: Ambulate - Progress: Progressing toward goal Pt will Go Up / Down Stairs: 3-5 stairs;with min assist;with least restrictive assistive device;with rail(s) PT Goal: Up/Down Stairs - Progress: Met  Visit Information  Last PT Received On: 06/15/12 Assistance Needed: +1    Subjective Data  Subjective: I'm ready to go home.  Patient Stated Goal: to return home   Cognition  Overall Cognitive Status: Appears within functional limits for tasks assessed/performed Arousal/Alertness: Awake/alert Orientation Level: Appears intact for tasks assessed Behavior During Session: [facility] for tasks performed    Balance     End of Session PT - End of Session Activity Tolerance: Patient tolerated treatment well Patient left: in chair;with call bell/phone within reach;with family/visitor present Nurse Communication: Mobility status   GP     Page, Meribeth Mattes 06/15/2012, 3:32 PM

## 2012-06-15 NOTE — Progress Notes (Signed)
Patient refused scheduled Norco. She states that is makes her nauseated.  Patient also refused bedtime Colace and Senna.  She says she will have diarrhea due to her cholecystectomy.  Will continue to monitor patient. Jordan Lane, Jordan Lane

## 2012-06-15 NOTE — Progress Notes (Signed)
  Subjective: 1 Day Post-Op Procedure(s) (LRB): UNICOMPARTMENTAL KNEE (Left)   Patient reports pain as moderate, not taking oral pain medication do to potential nausea. Only taking IV medication since she has been hesitant of the oral. Knows that she can't go home on the IV, so will try to just take the orals. No other events throughout the night. Cardio feels that the patient is doing well and no longer needs Telemetry after he event right after surgery.  Objective:   VITALS:   Filed Vitals:   06/15/12 0500  BP: 114/68  Pulse: 76  Temp: 98 F (36.7 C)  Resp: 16    Neurovascular intact Dorsiflexion/Plantar flexion intact Incision: dressing C/D/I No cellulitis present Compartment soft  LABS  Basename 06/15/12 0442  HGB 12.4  HCT 36.6  WBC 18.5*  PLT 286     Basename 06/15/12 0442  NA 140  K 3.4*  BUN 11  CREATININE 0.65  GLUCOSE 164*     Assessment/Plan: 1 Day Post-Op Procedure(s) (LRB): UNICOMPARTMENTAL KNEE (Left)  Added phenergan to help with potential nausea. Encouraged use of oral medication, or IV so he can go home. Advance diet Up with therapy D/C IV fluids Discharge home with home health, if she does well on the oral pain medication. Otherwise transfer off Telemetry. Per Cardio:  Will resume home BP meds. Also continue ARM and CCB. Follow up in 2 weeks at [facility].  Follow-up Information    Follow up with OLIN,Darlyn Repsher D in 2 weeks.   Contact information:   [facility] [address], [address] [place] [address] [phone]           Anastasio Auerbach. Deshay Blumenfeld   PAC  06/15/2012, 10:36 AM

## 2012-06-15 NOTE — Evaluation (Signed)
Physical Therapy Evaluation Patient Details Name: ALTAGRACIA RONE MRN: [id number] DOB: 1946-03-08 Today's Date: 06/15/2012 Time: 4098-1191 PT Time Calculation (min): 29 min  PT Assessment / Plan / Recommendation Clinical Impression  Pt presents s/p L UKR POD 1 with decreased strength, ROM, mobility and with increased pain.  Tolerated ambulation in hallway well with RW at min/guard level for safety.  Educated pt on how to adjust personal RW to appropriate height once home.  Pt will benefit from skilled PT in acute venue to address deficits.  PT recommends HHPT for follow up at D/C.     PT Assessment  Patient needs continued PT services    Follow Up Recommendations  Home health PT    Barriers to Discharge        Equipment Recommendations  3 in 1 bedside comode    Recommendations for Other Services OT consult   Frequency 7X/week    Precautions / Restrictions Precautions Precautions: Knee Precaution Comments: Did not use KI, pt able to perform SLR with <10 deg lag.  Restrictions Weight Bearing Restrictions: No Other Position/Activity Restrictions: WBAT   Pertinent Vitals/Pain 6/10, applied ice packs      Mobility  Bed Mobility Bed Mobility: Supine to Sit;Sitting - Scoot to Edge of Bed Supine to Sit: 4: Min guard Sitting - Scoot to [place] of Bed: 4: Min guard Details for Bed Mobility Assistance: Min/guard for safety for LLE off of bed with cues for hand placement/technique.  Transfers Transfers: Sit to Stand;Stand to Sit Sit to Stand: 4: Min assist;With upper extremity assist;From elevated surface;From bed Stand to Sit: 4: Min guard;With upper extremity assist;With armrests;To chair/3-in-1 Details for Transfer Assistance: Assist to steady once in standing due to slight dizziness.  Improved with being upright.  Cues for hand placement and LE management.  Ambulation/Gait Ambulation/Gait Assistance: 4: Min guard Ambulation Distance (Feet): 80 Feet Assistive device: Rolling  walker Ambulation/Gait Assistance Details: Cues for sequencing/technique with RW, for equal step lengths and for upright posture.   Gait Pattern: Step-to pattern;Decreased stance time - left;Decreased step length - right;Trunk flexed Gait velocity: decreased Stairs: No Wheelchair Mobility Wheelchair Mobility: No    Exercises     PT Diagnosis: Abnormality of gait;Generalized weakness;Acute pain  PT Problem List: Decreased strength;Decreased range of motion;Decreased activity tolerance;Decreased balance;Decreased mobility;Decreased knowledge of use of DME;Pain PT Treatment Interventions: DME instruction;Gait training;Stair training;Functional mobility training;Therapeutic activities;Therapeutic exercise;Balance training;Patient/family education   PT Goals Acute Rehab PT Goals PT Goal Formulation: With patient Time For Goal Achievement: 06/17/12 Potential to Achieve Goals: Good Pt will go Sit to Stand: with supervision PT Goal: Sit to Stand - Progress: Goal set today Pt will go Stand to Sit: with supervision PT Goal: Stand to Sit - Progress: Goal set today Pt will Ambulate: 51 - 150 feet;with supervision;with least restrictive assistive device PT Goal: Ambulate - Progress: Goal set today Pt will Go Up / Down Stairs: 3-5 stairs;with min assist;with least restrictive assistive device;with rail(s) PT Goal: Up/Down Stairs - Progress: Goal set today Pt will Perform Home Exercise Program: with supervision, verbal cues required/provided PT Goal: Perform Home Exercise Program - Progress: Goal set today  Visit Information  Last PT Received On: 06/15/12 Assistance Needed: +1    Subjective Data  Subjective: I really want this heart thing off Patient Stated Goal: to return home   Prior Functioning  Home Living Lives With: Spouse Available Help at Discharge: Family Type of Home: House Home Access: Stairs to enter [employer] of Steps: 3  Entrance Stairs-Rails: Right;Left;Can  reach both Home Layout: Two level;Able to live on main level with bedroom/bathroom Bathroom Shower/Tub: Engineer, manufacturing systems: Standard Home Adaptive Equipment: Walker - rolling Prior Function Level of Independence: Independent Able to Take Stairs?: Yes Driving: Yes Vocation: Retired Musician: No difficulties    Cognition  Overall Cognitive Status: Appears within functional limits for tasks assessed/performed Arousal/Alertness: Awake/alert Orientation Level: Appears intact for tasks assessed Behavior During Session: [facility] for tasks performed    Extremity/Trunk Assessment Right Lower Extremity Assessment RLE ROM/Strength/Tone: WFL for tasks assessed RLE Sensation: WFL - Light Touch RLE Coordination: WFL - gross motor Left Lower Extremity Assessment LLE ROM/Strength/Tone: Deficits LLE ROM/Strength/Tone Deficits: ankle motions WFL, able to perform SLR with < 10 deg lag.  LLE Sensation: WFL - Light Touch LLE Coordination: WFL - gross motor Trunk Assessment Trunk Assessment: Kyphotic   Balance    End of Session PT - End of Session Activity Tolerance: Patient tolerated treatment well Patient left: in chair;with call bell/phone within reach;with family/visitor present Nurse Communication: Mobility status  GP     Page, Meribeth Mattes 06/15/2012, 9:28 AM

## 2012-06-15 NOTE — Progress Notes (Addendum)
Patient ID: Jordan Lane, female   DOB: 1946-04-08, 66 y.o.   MRN: [id number]    Subjective:  Denies SSCP, palpitations or Dyspnea Knee a bit sore.  Objective:  Filed Vitals:   06/14/12 1700 06/14/12 2245 06/15/12 0247 06/15/12 0500  BP: 123/73 112/68 118/67 114/68  Pulse: 87 63 72 76  Temp: 97.8 F (36.6 C) 98.3 F (36.8 C) 97.8 F (36.6 C) 98 F (36.7 C)  TempSrc: Oral Oral Oral Oral  Resp: 16 16 16 16   Height:      Weight:      SpO2: 93% 94% 94% 95%    Intake/Output from previous day:  Intake/Output Summary (Last 24 hours) at 06/15/12 0651 Last data filed at 06/15/12 0100  Gross per 24 hour  Intake 2475.13 ml  Output   1865 ml  Net 610.13 ml    Physical Exam: Affect appropriate Healthy:  appears stated age HEENT: normal Neck supple with no adenopathy JVP normal no bruits no thyromegaly Lungs clear with no wheezing and good diaphragmatic motion Heart:  S1/S2 no murmur, no rub, gallop or click PMI normal Abdomen: benighn, BS positve, no tenderness, no AAA no bruit.  No HSM or HJR Distal pulses intact with no bruits No edema Neuro non-focal Skin warm and dry No muscular weakness   Lab Results: Basic Metabolic Panel:  Basename 06/15/12 0442  NA 140  K 3.4*  CL 104  CO2 26  GLUCOSE 164*  BUN 11  CREATININE 0.65  CALCIUM 8.6  MG --  PHOS --   Liver Function Tests: No results found for this basename: AST:2,ALT:2,ALKPHOS:2,BILITOT:2,PROT:2,ALBUMIN:2 in the last 72 hours No results found for this basename: LIPASE:2,AMYLASE:2 in the last 72 hours CBC:  Basename 06/15/12 0442  WBC 18.5*  NEUTROABS --  HGB 12.4  HCT 36.6  MCV 89.3  PLT 286   Cardiac Enzymes:  Basename 06/14/12 1151  CKTOTAL 139  CKMB 2.4  CKMBINDEX --  TROPONINI <0.30   Imaging: No results found.  Cardiac Studies:  ECG: NSR rate 74 normal   Telemetry:  NSR no arrhythmia rates 70-80 06/15/2012   Echo: Reviewed normal EF 60% no valve disease or  RWMA  Medications:     . amLODipine  5 mg Oral Daily  .  ceFAZolin (ANCEF) IV  1 g Intravenous Q6H  .  ceFAZolin (ANCEF) IV  2 g Intravenous 60 min Pre-Op  . dexamethasone  10 mg Intravenous Once  . docusate sodium  100 mg Oral BID  . ferrous sulfate  325 mg Oral TID PC  . FLUoxetine  10 mg Oral Daily  . hydrochlorothiazide  12.5 mg Oral Daily  . HYDROcodone-acetaminophen  1-2 tablet Oral Q4H  . irbesartan  150 mg Oral Daily  . metoprolol      . metoprolol  5 mg Intravenous Once  . metoprolol tartrate  12.5 mg Oral BID  . pantoprazole  80 mg Oral Q1200  . promethazine      . rivaroxaban  10 mg Oral Q breakfast  . senna  1 tablet Oral BID  . DISCONTD: tranexamic acid (CYLOKAPRON) IVPB (ORTHO)  1,180 mg Intravenous To OR  . DISCONTD: valsartan-hydrochlorothiazide  1 tablet Oral QPC breakfast       . sodium chloride 0.9 % 1,000 mL with potassium chloride 10 mEq infusion 100 mL/hr at 06/15/12 0458  . DISCONTD: lactated ringers 999 mL/hr at 06/14/12 1303    Assessment/Plan:  Tachycardia:  Resolved.  R/O  ECG this am normal.  Echo is normal.  Can D/C telemetry  No further  Cardiac w/u needed.  Resume home BP meds HTN:  Stable continue ARB and calcium blocker Knee: Ok to continue with full rehab KCL:  Supplement via iv DVT:  On xarelto with no bleeding   Will sign off  Charlton Haws 06/15/2012, 6:51 AM

## 2012-06-15 NOTE — Discharge Summary (Signed)
Physician Discharge Summary  Patient ID: Jordan Lane MRN: [id number] DOB/AGE: December 18, 1945 66 y.o.  Admit date: 06/14/2012 Discharge date: 06/15/2012  Procedures:  Procedure(s) (LRB): UNICOMPARTMENTAL KNEE (Left)  Attending Physician:  Dr. Durene Romans   Admission Diagnoses:  S/P left unicompartmental knee replacement   Discharge Diagnoses:  Principal Problem:  *S/P left UKR Active Problems:  Tachycardia  PVC (premature ventricular contraction)  HTN (hypertension)  . Reflux   . Uterine prolapse   . Hypertension   . 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 After discussion of treatments, benefits, risks, and options, the patient is now scheduled for unicompartmental arthroplasty, medial compartment left knee. Note that the patient is a candidate for tranexamic acid and dexamethasone and will receive those at surgery. She is planning on going home after surgery. She is given her home medications of aspirin, Robaxin, iron, MiraLax, and Colace.  PCP: [name], MD   Discharged Condition: good  Hospital Course:  Patient underwent the above stated procedure on 06/14/2012. Patient tolerated the procedure well and brought to the recovery room in good condition and subsequently to the floor.  POD #1 BP: 114/68 ; Pulse: 76 ; Temp: 98 F (36.7 C) ; Resp: 16  Pt's foley was removed, as well as the hemovac drain removed. IV was changed to a saline lock. Patient reports pain as moderate, not taking oral pain medication do to potential nausea. Only taking IV medication since she has been hesitant of the oral. Knows that she can't go home on the IV, so will try to just take the orals. No other events throughout the night. Cardio feels that the patient is doing well and no longer needs Telemetry after he event right after surgery. Ready to be  discharged home.  LABS  Basename  06/15/12 0442   HGB  12.4  HCT  36.6    Discharge Exam: General appearance: alert, cooperative and no distress Extremities: Homans sign is negative, no sign of DVT, no edema, redness or tenderness in the calves or thighs and no ulcers, gangrene or trophic changes  Disposition: Home or Self Care with follow up in 2 weeks   Follow-up Information    Follow up with Shelda Pal, MD. Schedule an appointment as soon as possible for a visit in 2 weeks.   Contact information:   [facility] [address] [place] [address] [phone]          Discharge Orders    Future Orders Please Complete By Expires   Diet - low sodium heart healthy      Call MD / Call 911      Comments:   If you experience chest pain or shortness of breath, CALL 911 and be transported to the hospital emergency room.  If you develope a fever above 101 F, pus (white drainage) or increased drainage or redness at the wound, or calf pain, call your surgeon's office.   Discharge instructions      Comments:   Maintain surgical dressing for 8 days, then replace with gauze and tape. Keep the area dry and clean until follow up. Follow up in 2 weeks at [facility]. Call with any questions or concerns.   Constipation Prevention      Comments:   Drink plenty of fluids.  Prune juice may be helpful.  You  may use a stool softener, such as Colace (over the counter) 100 mg twice a day.  Use MiraLax (over the counter) for constipation as needed.   Increase activity slowly as tolerated      Driving restrictions      Comments:   No driving for 4 weeks   TED hose      Comments:   Use stockings (TED hose) for 2 weeks on both leg(s).  You may remove them at night for sleeping.   Change dressing      Comments:   Maintain surgical dressing for 8 days, then change the dressing daily with sterile 4 x 4 inch gauze dressing and tape. Keep the  area dry and clean.      Discharge Medication List as of 06/15/2012  3:19 PM    START taking these medications   Details  aspirin EC 325 MG tablet Take 1 tablet (325 mg total) by mouth 2 (two) times daily. X 4 weeks, Starting 06/15/2012, Until Sat 06/25/12, No Print    docusate sodium 100 MG CAPS Take 100 mg by mouth 2 (two) times daily., Starting 06/15/2012, Until Sat 06/25/12, No Print    ferrous sulfate 325 (65 FE) MG tablet Take 1 tablet (325 mg total) by mouth 3 (three) times daily after meals., Starting 06/15/2012, Until Thu 06/15/13, No Print    HYDROcodone-acetaminophen (NORCO) 5-325 MG per tablet Take 1-2 tablets by mouth every 4 (four) hours as needed for pain., Starting 06/15/2012, Until Sat 06/25/12, Print    methocarbamol (ROBAXIN) 500 MG tablet Take 1 tablet (500 mg total) by mouth every 6 (six) hours as needed (muscle spasms)., Starting 06/15/2012, Until Sat 06/25/12, No Print    polyethylene glycol (MIRALAX / GLYCOLAX) packet Take 17 g by mouth daily as needed., Starting 06/15/2012, Until Sat 06/18/12, No Print      CONTINUE these medications which have CHANGED   Details  metoprolol tartrate (LOPRESSOR) 12.5 mg TABS Take 0.5 tablets (12.5 mg total) by mouth 2 (two) times daily., Starting 06/15/2012, Until Discontinued, Print    promethazine (PHENERGAN) 12.5 MG tablet Take 1-2 tablets (12.5-25 mg total) by mouth every 6 (six) hours as needed (with medication to help avoid nausea)., Starting 06/15/2012, Until Wed 06/22/12, Print      CONTINUE these medications which have NOT CHANGED   Details  amLODipine (NORVASC) 5 MG tablet Take 5 mg by mouth daily after breakfast., Until Discontinued, Historical Med    FLUoxetine (PROZAC) 10 MG capsule Take 10 mg by mouth daily after breakfast. , Starting 03/27/2012, Until Discontinued, Historical Med    Misc Natural Products (OSTEO BI-FLEX TRIPLE STRENGTH PO) Take 1 tablet by mouth daily., Until Discontinued, Historical Med    omeprazole  (PRILOSEC) 40 MG capsule Take 40 mg by mouth every morning., Until Discontinued, Historical Med    valsartan-hydrochlorothiazide (DIOVAN-HCT) 160-12.5 MG per tablet Take 1 tablet by mouth daily after breakfast., Until Discontinued, Historical Med    Calcium Carbonate-Vitamin D (CALCIUM + D PO) Take 1 tablet by mouth daily. , Until Discontinued, Historical Med    Cholecalciferol (VITAMIN D PO) Take 2,000 Units by mouth daily. , Until Discontinued, Historical Med    esomeprazole (NEXIUM) 40 MG capsule Take 40 mg by mouth every morning. , Until Discontinued, Historical Med    LORazepam (ATIVAN) 0.5 MG tablet Take 0.5 mg by mouth every 8 (eight) hours as needed. Anxiety, Until Discontinued, Historical Med    Multiple Vitamin (MULTIVITAMIN) tablet Take 1 tablet by mouth daily.,  Until Discontinued, Historical Med      STOP taking these medications     acetaminophen (TYLENOL) 500 MG tablet Comments:  Reason for Stopping:       meloxicam (MOBIC) 7.5 MG tablet Comments:  Reason for Stopping:       HYDROcodone-acetaminophen (NORCO) 7.5-325 MG per tablet Comments:  Reason for Stopping:       irbesartan (AVAPRO) 150 MG tablet Comments:  Reason for Stopping:       naproxen sodium (ANAPROX) 220 MG tablet Comments:  Reason for Stopping:           Signed: Anastasio Auerbach. Isahia Hollerbach   PAC  06/15/2012, 8:30 PM

## 2012-06-15 NOTE — Progress Notes (Signed)
OT Note:  Pt screened for OT.  She had 3:1 delivered and has assistance.  She is moving well with PT and didn't feel she needed to review any ADLs/bathroom transfers.  [place], OTR/L [phone] 06/15/2012

## 2012-08-31 ENCOUNTER — Other Ambulatory Visit: Payer: Self-pay | Admitting: Dermatology

## 2012-11-14 ENCOUNTER — Ambulatory Visit (HOSPITAL_COMMUNITY)
Admission: RE | Admit: 2012-11-14 | Discharge: 2012-11-14 | Disposition: A | Discharge status: Home / Self Care | Payer: Medicare Other | Source: Ambulatory Visit | Attending: Gastroenterology | Admitting: Gastroenterology

## 2012-11-14 ENCOUNTER — Encounter (HOSPITAL_COMMUNITY): Payer: Self-pay | Admitting: *Deleted

## 2012-11-14 ENCOUNTER — Encounter (HOSPITAL_COMMUNITY)
Admission: RE | Disposition: A | Discharge status: Home / Self Care | Payer: Self-pay | Source: Ambulatory Visit | Attending: Gastroenterology

## 2012-11-14 DIAGNOSIS — IMO0002 Reserved for concepts with insufficient information to code with codable children: Secondary | ICD-10-CM | POA: Insufficient documentation

## 2012-11-14 DIAGNOSIS — T18108A Unspecified foreign body in esophagus causing other injury, initial encounter: Secondary | ICD-10-CM | POA: Insufficient documentation

## 2012-11-14 DIAGNOSIS — R1310 Dysphagia, unspecified: Secondary | ICD-10-CM | POA: Insufficient documentation

## 2012-11-14 DIAGNOSIS — I10 Essential (primary) hypertension: Secondary | ICD-10-CM | POA: Insufficient documentation

## 2012-11-14 HISTORY — PX: ESOPHAGOGASTRODUODENOSCOPY: SHX5428

## 2012-11-14 SURGERY — EGD (ESOPHAGOGASTRODUODENOSCOPY)
Anesthesia: Moderate Sedation

## 2012-11-14 MED ORDER — SODIUM CHLORIDE 0.9 % IV SOLN
INTRAVENOUS | Status: DC
  Administered 2012-11-14: 14:00:00 via INTRAVENOUS

## 2012-11-14 MED ORDER — BUTAMBEN-TETRACAINE-BENZOCAINE 2-2-14 % EX AERO
INHALATION_SPRAY | CUTANEOUS | Status: DC | PRN
  Administered 2012-11-14: 2 via TOPICAL

## 2012-11-14 MED ORDER — DIPHENHYDRAMINE HCL 50 MG/ML IJ SOLN
INTRAMUSCULAR | Status: AC
  Filled 2012-11-14: qty 1

## 2012-11-14 MED ORDER — SODIUM CHLORIDE 0.9 % IV SOLN: INTRAVENOUS | Status: DC

## 2012-11-14 MED ORDER — FENTANYL CITRATE 0.05 MG/ML IJ SOLN
INTRAMUSCULAR | Status: AC
  Filled 2012-11-14: qty 2

## 2012-11-14 MED ORDER — MIDAZOLAM HCL 10 MG/2ML IJ SOLN
INTRAMUSCULAR | Status: DC | PRN
  Administered 2012-11-14 (×3): 2 mg via INTRAVENOUS

## 2012-11-14 MED ORDER — FENTANYL CITRATE 0.05 MG/ML IJ SOLN
INTRAMUSCULAR | Status: DC | PRN
  Administered 2012-11-14 (×3): 25 ug via INTRAVENOUS

## 2012-11-14 MED ORDER — MIDAZOLAM HCL 5 MG/ML IJ SOLN
INTRAMUSCULAR | Status: AC
  Filled 2012-11-14: qty 3

## 2012-11-14 NOTE — H&P (Signed)
The patient is a 66 year old female who presents to the endoscopy unit today as an outpatient with difficulty swallowing. She was eating chicken last night and a piece of chicken got stuck in her esophagus. She has been unable to dislodge it, and therefore presents here today for outpatient EGD with foreign body removal from the esophagus.  Past history hypertension  Medications noted on chart  Physical: She is in no distress  Heart regular rhythm  Lungs clear  Abdomen: Soft nontender no hepatosplenomegaly  Impression: Esophageal foreign body (meat impaction)  Plan: EGD with foreign body removal

## 2012-11-14 NOTE — Op Note (Signed)
[facility] [address] [place], [address]   ENDOSCOPY PROCEDURE REPORT  PATIENT: Jordan Lane, Jordan Lane  MR#: [id number] BIRTHDATE: November 04, 1946 , 66  yrs. old GENDER: Female ENDOSCOPIST: Wandalee Ferdinand, MD REFERRED BY: PROCEDURE DATE:  11/14/2012 PROCEDURE:   EGD with foreign body removal ASA CLASS: 2 INDICATIONS: esophageal meat impaction MEDICATIONS: fentanyl 75 mcg IV, Versed 6 mg IV TOPICAL ANESTHETIC:  DESCRIPTION OF PROCEDURE:   After the risks benefits and alternatives of the procedure were thoroughly explained, informed consent was obtained.  The Pentax Gastroscope [id number]  endoscope was introduced through the mouth and advanced to the distal esophagus where a meat impaction was found, after it was removed the scope was able to be advanced to the second portion of the duodenum      , limited by Without limitations.   The instrument was slowly withdrawn as the mucosa was fully examined.      FINDINGS:  Esophagus: Meat impaction in the distal esophagus removed with Talon forceps . This was broken up and then able to be passed into the stomach. The area in the distal esophagus where it was lodged was irritated-appearing. No obvious stricture was seen.  Stomach: Normal  Duodenum: Normal  COMPLICATIONS:none  ENDOSCOPIC IMPRESSION: meat impaction in the esophagus   RECOMMENDATIONS: chew food well and he slowly, avoid large chunks of meat.   :   _______________________________ Rosalie DoctorWandalee Ferdinand, MD 11/14/2012 2:40 PM       PATIENT NAME:  Jordan Lane, Jordan Lane MR#: [id number]

## 2012-11-15 ENCOUNTER — Encounter (HOSPITAL_COMMUNITY): Payer: Self-pay | Admitting: Gastroenterology

## 2013-08-30 ENCOUNTER — Other Ambulatory Visit: Payer: Self-pay | Admitting: Dermatology

## 2013-11-15 ENCOUNTER — Encounter (HOSPITAL_COMMUNITY): Payer: Self-pay | Admitting: Pharmacy Technician

## 2013-11-16 ENCOUNTER — Encounter (HOSPITAL_COMMUNITY)
Admission: RE | Admit: 2013-11-16 | Discharge: 2013-11-16 | Disposition: A | Discharge status: Home / Self Care | Payer: Medicare Other | Source: Ambulatory Visit | Attending: Orthopedic Surgery | Admitting: Orthopedic Surgery

## 2013-11-16 ENCOUNTER — Encounter (HOSPITAL_COMMUNITY): Payer: Self-pay

## 2013-11-16 ENCOUNTER — Ambulatory Visit (HOSPITAL_COMMUNITY)
Admission: RE | Admit: 2013-11-16 | Discharge: 2013-11-16 | Disposition: A | Discharge status: Home / Self Care | Payer: Medicare Other | Source: Ambulatory Visit | Attending: Orthopedic Surgery | Admitting: Orthopedic Surgery

## 2013-11-16 DIAGNOSIS — IMO0002 Reserved for concepts with insufficient information to code with codable children: Secondary | ICD-10-CM | POA: Insufficient documentation

## 2013-11-16 DIAGNOSIS — Z01818 Encounter for other preprocedural examination: Secondary | ICD-10-CM | POA: Insufficient documentation

## 2013-11-16 DIAGNOSIS — I10 Essential (primary) hypertension: Secondary | ICD-10-CM | POA: Insufficient documentation

## 2013-11-16 DIAGNOSIS — Z01810 Encounter for preprocedural cardiovascular examination: Secondary | ICD-10-CM | POA: Insufficient documentation

## 2013-11-16 DIAGNOSIS — I771 Stricture of artery: Secondary | ICD-10-CM | POA: Insufficient documentation

## 2013-11-16 DIAGNOSIS — Z01812 Encounter for preprocedural laboratory examination: Secondary | ICD-10-CM | POA: Insufficient documentation

## 2013-11-16 LAB — URINALYSIS, ROUTINE W REFLEX MICROSCOPIC
Ketones, ur: NEGATIVE mg/dL
Nitrite: NEGATIVE
Protein, ur: NEGATIVE mg/dL
Urobilinogen, UA: 0.2 mg/dL (ref 0.0–1.0)

## 2013-11-16 LAB — BASIC METABOLIC PANEL
CO2: 29 mEq/L (ref 19–32)
Calcium: 9.8 mg/dL (ref 8.4–10.5)
Chloride: 99 mEq/L (ref 96–112)
Creatinine, Ser: 0.79 mg/dL (ref 0.50–1.10)
GFR calc Af Amer: >90 mL/min (ref >90)
Sodium: 138 mEq/L (ref 135–145)

## 2013-11-16 LAB — APTT: aPTT: 28 seconds (ref 24–37)

## 2013-11-16 LAB — CBC
Platelets: 314 10*3/uL (ref 150–400)
RBC: 4.65 MIL/uL (ref 3.87–5.11)
RDW: 13.3 % (ref 11.5–15.5)
WBC: 8.1 10*3/uL (ref 4.0–10.5)

## 2013-11-16 LAB — URINE MICROSCOPIC-ADD ON

## 2013-11-16 LAB — PROTIME-INR: INR: 0.93 (ref 0.00–1.49)

## 2013-11-16 LAB — SURGICAL PCR SCREEN: MRSA, PCR: NEGATIVE

## 2013-11-16 NOTE — Patient Instructions (Signed)
JAQUITTA DUPRIEST  11/16/2013                           YOUR PROCEDURE IS SCHEDULED ON: 11/27/13               PLEASE REPORT TO SHORT STAY CENTER AT : 10:00 AM               CALL THIS NUMBER IF ANY PROBLEMS THE DAY OF SURGERY :               [phone]                      REMEMBER:   Do not eat food or drink liquids AFTER MIDNIGHT  May have clear liquids UNTIL 6 HOURS BEFORE SURGERY(7:00 AM)  Clear liquids include soda, tea, black coffee, apple or grape juice, broth.  Take these medicines the morning of surgery with A SIP OF WATER:  AMLODIPINE / ZANTAC   Do not wear jewelry, make-up   Do not wear lotions, powders, or perfumes.   Do not shave legs or underarms 12 hrs. before surgery (men may shave face)  Do not bring valuables to the hospital.  Contacts, dentures or bridgework may not be worn into surgery.  Leave suitcase in the car. After surgery it may be brought to your room.  For patients admitted to the hospital more than one night, checkout time is 11:00                          The day of discharge.   Patients discharged the day of surgery will not be allowed to drive home                             If going home same day of surgery, must have someone stay with you first                           24 hrs at home and arrange for some one to drive you home from hospital.    Special Instructions:   Please read over the following fact sheets that you were given:               1. MRSA  INFORMATION                      2. [facility] PREPARING FOR SURGERY SHEET               3. INCENTIVE SPIROMETER                                                X_____________________________________________________________________        Failure to follow these instructions may result in cancellation of your surgery

## 2013-11-16 NOTE — Progress Notes (Signed)
Abnormal UA and PCR faxed to Dr. Charlann Boxer - Ethelda Chick notified of +PCR

## 2013-11-20 NOTE — H&P (Signed)
MEDIAL UNICOMPARTMENTAL KNEE ADMISSION H&P  Patient is being admitted for right medial unicompartmental knee arthroplasty.  Subjective:  Chief Complaint:    Right knee medial compartment OA / pain.  HPI: Jordan Lane, 67 y.o. female, has a history of pain and functional disability in the right knee due to arthritis and has failed non-surgical conservative treatments for greater than 12 weeks to includeNSAID's and/or analgesics and activity modification.  Onset of symptoms was gradual, starting years ago with gradually worsening course since that time. The patient noted prior procedures on the knee to include  arthroscopy on the right knee(s).  Patient currently rates pain in the right knee(s) at 8 out of 10 with activity. Patient has worsening of pain with activity and weight bearing, pain that interferes with activities of daily living, pain with passive range of motion, crepitus and joint swelling.  Patient has evidence of periarticular osteophytes and joint space narrowing of the medial compartment by imaging studies.  There is no active infection.  Risks, benefits and expectations were discussed with the patient.  Risks including but not limited to the risk of anesthesia, blood clots, nerve damage, blood vessel damage, failure of the prosthesis, infection and up to and including death.  Patient understand the risks, benefits and expectations and wishes to proceed with surgery.   D/C Plans:   Home with HHPT  Post-op Meds:    No Rx given   Tranexamic Acid:   Not to be given - CAD  Decadron:    To be given  FYI:    Norco with phenergan post-op   ASA post-op  Patient Active Problem List   Diagnosis Date Noted  . 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 Tachycardia 06/14/2012  . PVC (premature ventricular contraction) 06/14/2012  . HTN (hypertension) 06/14/2012  . Reflux   . Uterine prolapse   . Hypertension   . Fibroadenoma of breast    Past Medical History  Diagnosis Date  . Hypertension    . Fibroadenoma of breast     Right  . Reflux   . GERD (gastroesophageal reflux disease)   . Arthritis   . History of blood transfusion 48 yrs ago    Past Surgical History  Procedure Laterality Date  . Knee surgery  left 2010, right 2013    Arthroscopic  . Herniated disc  1987    lower back   . Abdominal hysterectomy  1984    TAH  . Cholecystectomy  2009  . Partial knee arthroplasty  06/14/2012    Procedure: UNICOMPARTMENTAL KNEE;  Surgeon: Shelda Pal, MD;  Location: [facility] ORS;  Service: Orthopedics;  Laterality: Left;  Left Medial Unicompartmental Knee  . Esophagogastroduodenoscopy  11/14/2012    Procedure: ESOPHAGOGASTRODUODENOSCOPY (EGD);  Surgeon: Graylin Shiver, MD;  Location: [facility] ENDOSCOPY;  Service: Endoscopy;  Laterality: N/A;    No prescriptions prior to admission   Allergies  Allergen Reactions  . Codeine Nausea And Vomiting    History  Substance Use Topics  . Smoking status: Never Smoker   . Smokeless tobacco: Never Used  . Alcohol Use: No    Family History  Problem Relation Age of Onset  . Hypertension Father   . Diabetes Father   . Breast cancer Paternal Aunt     Age 40's  . Breast cancer Paternal Grandmother     Age 33  . Other Mother     Brain tumor     Review of Systems  Constitutional: Negative.   HENT: Negative.  Eyes: Negative.   Respiratory: Negative.   Cardiovascular: Negative.   Gastrointestinal: Positive for heartburn and diarrhea.  Genitourinary: Negative.   Musculoskeletal: Positive for joint pain.  Skin: Negative.   Neurological: Negative.   Endo/Heme/Allergies: Negative.   Psychiatric/Behavioral: Negative.     Objective:  Physical Exam  Constitutional: She is oriented to person, place, and time. She appears well-developed and well-nourished.  HENT:  Head: Normocephalic and atraumatic.  Mouth/Throat: Oropharynx is clear and moist.  Eyes: Pupils are equal, round, and reactive to light.  Neck: Neck supple. No JVD present. No  tracheal deviation present. No thyromegaly present.  Cardiovascular: Normal rate, regular rhythm, normal heart sounds and intact distal pulses.   Respiratory: Effort normal and breath sounds normal. No stridor. No respiratory distress. She has no wheezes.  GI: Soft. There is no tenderness. There is no guarding.  Musculoskeletal:       Right knee: She exhibits decreased range of motion, swelling and bony tenderness. She exhibits no effusion, no ecchymosis, no deformity, no laceration and no erythema. Tenderness found. Medial joint line tenderness noted. No lateral joint line tenderness noted.  Lymphadenopathy:    She has no cervical adenopathy.  Neurological: She is alert and oriented to person, place, and time.  Skin: Skin is warm and dry.  Psychiatric: She has a normal mood and affect.     Labs:  Estimated body mass index is 30.58 kg/(m^2) as calculated from the following:   Height as of 11/14/12: 5' 2.5" (1.588 m).   Weight as of 11/14/12: 77.111 kg (170 lb).   Imaging Review Plain radiographs demonstrate severe degenerative joint disease of the right knee(s). The overall alignment is neutral. The bone quality appears to be good for age and reported activity level.  Assessment/Plan:  End stage arthritis, right knee   The patient history, physical examination, clinical judgment of the provider and imaging studies are consistent with end stage degenerative joint disease of the right knee(s) and total knee arthroplasty is deemed medically necessary. The treatment options including medical management, injection therapy arthroscopy and arthroplasty were discussed at length. The risks and benefits of total knee arthroplasty were presented and reviewed. The risks due to aseptic loosening, infection, stiffness, patella tracking problems, thromboembolic complications and other imponderables were discussed. The patient acknowledged the explanation, agreed to proceed with the plan and consent was  signed. Patient is being admitted for inpatient treatment for surgery, pain control, PT, OT, prophylactic antibiotics, VTE prophylaxis, progressive ambulation and ADL's and discharge planning. The patient is planning to be discharged home with home health services.     Anastasio Auerbach Seaborn Nakama   PAC  11/20/2013, 9:26 AM

## 2013-11-27 ENCOUNTER — Encounter (HOSPITAL_COMMUNITY)
Admission: RE | Disposition: A | Discharge status: Home Health | Payer: Self-pay | Source: Ambulatory Visit | Attending: Orthopedic Surgery

## 2013-11-27 ENCOUNTER — Encounter (HOSPITAL_COMMUNITY): Payer: Medicare Other | Admitting: Anesthesiology

## 2013-11-27 ENCOUNTER — Encounter (HOSPITAL_COMMUNITY): Payer: Self-pay | Admitting: *Deleted

## 2013-11-27 ENCOUNTER — Inpatient Hospital Stay (HOSPITAL_COMMUNITY): Payer: Medicare Other | Admitting: Anesthesiology

## 2013-11-27 ENCOUNTER — Inpatient Hospital Stay (HOSPITAL_COMMUNITY)
Admission: RE | Admit: 2013-11-27 | Discharge: 2013-11-28 | DRG: 470 | Disposition: A | Discharge status: Home Health | Payer: Medicare Other | Source: Ambulatory Visit | Attending: Orthopedic Surgery | Admitting: Orthopedic Surgery

## 2013-11-27 DIAGNOSIS — Z803 Family history of malignant neoplasm of breast: Secondary | ICD-10-CM

## 2013-11-27 DIAGNOSIS — Z8249 Family history of ischemic heart disease and other diseases of the circulatory system: Secondary | ICD-10-CM

## 2013-11-27 DIAGNOSIS — I10 Essential (primary) hypertension: Secondary | ICD-10-CM | POA: Diagnosis present

## 2013-11-27 DIAGNOSIS — Z833 Family history of diabetes mellitus: Secondary | ICD-10-CM

## 2013-11-27 DIAGNOSIS — K219 Gastro-esophageal reflux disease without esophagitis: Secondary | ICD-10-CM | POA: Diagnosis present

## 2013-11-27 DIAGNOSIS — Z96659 Presence of unspecified artificial knee joint: Secondary | ICD-10-CM

## 2013-11-27 DIAGNOSIS — I2510 Atherosclerotic heart disease of native coronary artery without angina pectoris: Secondary | ICD-10-CM | POA: Diagnosis present

## 2013-11-27 DIAGNOSIS — R112 Nausea with vomiting, unspecified: Secondary | ICD-10-CM | POA: Diagnosis not present

## 2013-11-27 DIAGNOSIS — M1710 Unilateral primary osteoarthritis, unspecified knee: Principal | ICD-10-CM | POA: Diagnosis present

## 2013-11-27 DIAGNOSIS — Z6831 Body mass index (BMI) 31.0-31.9, adult: Secondary | ICD-10-CM

## 2013-11-27 DIAGNOSIS — E669 Obesity, unspecified: Secondary | ICD-10-CM | POA: Diagnosis present

## 2013-11-27 DIAGNOSIS — Z96651 Presence of right artificial knee joint: Secondary | ICD-10-CM

## 2013-11-27 HISTORY — PX: PARTIAL KNEE ARTHROPLASTY: SHX2174

## 2013-11-27 LAB — TYPE AND SCREEN: Antibody Screen: NEGATIVE

## 2013-11-27 SURGERY — ARTHROPLASTY, KNEE, UNICOMPARTMENTAL
Anesthesia: Spinal | Site: Knee | Laterality: Right

## 2013-11-27 MED ORDER — BUPIVACAINE LIPOSOME 1.3 % IJ SUSP
20.0000 mL | Freq: Once | INTRAMUSCULAR | Status: AC
  Administered 2013-11-27: 20 mL
  Filled 2013-11-27: qty 20

## 2013-11-27 MED ORDER — BUPIVACAINE-EPINEPHRINE 0.25% -1:200000 IJ SOLN
INTRAMUSCULAR | Status: DC | PRN
  Administered 2013-11-27: 25 mL

## 2013-11-27 MED ORDER — STERILE WATER FOR IRRIGATION IR SOLN
Status: DC | PRN
  Administered 2013-11-27: 1

## 2013-11-27 MED ORDER — LIDOCAINE HCL 1 % IJ SOLN
INTRAMUSCULAR | Status: DC | PRN
  Administered 2013-11-27: 1 mL

## 2013-11-27 MED ORDER — PROMETHAZINE HCL 25 MG PO TABS
12.5000 mg | ORAL_TABLET | ORAL | Status: DC | PRN
  Administered 2013-11-28 (×2): 25 mg via ORAL
  Filled 2013-11-27 (×3): qty 1

## 2013-11-27 MED ORDER — CELECOXIB 200 MG PO CAPS
200.0000 mg | ORAL_CAPSULE | Freq: Two times a day (BID) | ORAL | Status: DC
  Administered 2013-11-27 – 2013-11-28 (×2): 200 mg via ORAL
  Filled 2013-11-27 (×4): qty 1

## 2013-11-27 MED ORDER — MENTHOL 3 MG MT LOZG: 1.0000 | LOZENGE | OROMUCOSAL | Status: DC | PRN

## 2013-11-27 MED ORDER — LIDOCAINE HCL 1 % IJ SOLN
INTRAMUSCULAR | Status: AC
  Filled 2013-11-27: qty 20

## 2013-11-27 MED ORDER — LORAZEPAM 0.5 MG PO TABS: 0.5000 mg | ORAL_TABLET | Freq: Four times a day (QID) | ORAL | Status: DC | PRN

## 2013-11-27 MED ORDER — CEFAZOLIN SODIUM-DEXTROSE 2-3 GM-% IV SOLR
2.0000 g | Freq: Four times a day (QID) | INTRAVENOUS | Status: AC
  Administered 2013-11-27 – 2013-11-28 (×2): 2 g via INTRAVENOUS
  Filled 2013-11-27 (×2): qty 50

## 2013-11-27 MED ORDER — IRBESARTAN 150 MG PO TABS
150.0000 mg | ORAL_TABLET | Freq: Every day | ORAL | Status: DC
  Administered 2013-11-28: 150 mg via ORAL
  Filled 2013-11-27 (×2): qty 1

## 2013-11-27 MED ORDER — BUPIVACAINE-EPINEPHRINE PF 0.25-1:200000 % IJ SOLN
INTRAMUSCULAR | Status: AC
  Filled 2013-11-27: qty 30

## 2013-11-27 MED ORDER — HYDROMORPHONE HCL PF 1 MG/ML IJ SOLN: 0.2500 mg | INTRAMUSCULAR | Status: DC | PRN

## 2013-11-27 MED ORDER — PROMETHAZINE HCL 25 MG/ML IJ SOLN: 6.2500 mg | INTRAMUSCULAR | Status: DC | PRN

## 2013-11-27 MED ORDER — ONDANSETRON HCL 4 MG/2ML IJ SOLN
INTRAMUSCULAR | Status: DC | PRN
  Administered 2013-11-27: 4 mg via INTRAVENOUS

## 2013-11-27 MED ORDER — DOCUSATE SODIUM 100 MG PO CAPS
100.0000 mg | ORAL_CAPSULE | Freq: Two times a day (BID) | ORAL | Status: DC
  Administered 2013-11-28: 100 mg via ORAL

## 2013-11-27 MED ORDER — HYDROCHLOROTHIAZIDE 12.5 MG PO CAPS
12.5000 mg | ORAL_CAPSULE | Freq: Every day | ORAL | Status: DC
  Administered 2013-11-28: 12.5 mg via ORAL
  Filled 2013-11-27 (×2): qty 1

## 2013-11-27 MED ORDER — METHOCARBAMOL 100 MG/ML IJ SOLN
500.0000 mg | Freq: Four times a day (QID) | INTRAVENOUS | Status: DC | PRN
  Filled 2013-11-27 (×2): qty 5

## 2013-11-27 MED ORDER — ASPIRIN EC 325 MG PO TBEC
325.0000 mg | DELAYED_RELEASE_TABLET | Freq: Two times a day (BID) | ORAL | Status: DC
  Administered 2013-11-28: 325 mg via ORAL
  Filled 2013-11-27 (×3): qty 1

## 2013-11-27 MED ORDER — HYDROCODONE-ACETAMINOPHEN 7.5-325 MG PO TABS
1.0000 | ORAL_TABLET | ORAL | Status: DC
  Administered 2013-11-27: 2 via ORAL
  Filled 2013-11-27: qty 2

## 2013-11-27 MED ORDER — POLYETHYLENE GLYCOL 3350 17 G PO PACK: 17.0000 g | PACK | Freq: Two times a day (BID) | ORAL | Status: DC

## 2013-11-27 MED ORDER — DIPHENHYDRAMINE HCL 25 MG PO CAPS: 25.0000 mg | ORAL_CAPSULE | Freq: Four times a day (QID) | ORAL | Status: DC | PRN

## 2013-11-27 MED ORDER — AMLODIPINE BESYLATE 5 MG PO TABS
5.0000 mg | ORAL_TABLET | Freq: Every day | ORAL | Status: DC
  Administered 2013-11-28: 5 mg via ORAL
  Filled 2013-11-27 (×2): qty 1

## 2013-11-27 MED ORDER — PROPOFOL INFUSION 10 MG/ML OPTIME
INTRAVENOUS | Status: DC | PRN
  Administered 2013-11-27: 50 ug/kg/min via INTRAVENOUS

## 2013-11-27 MED ORDER — CEFAZOLIN SODIUM-DEXTROSE 2-3 GM-% IV SOLR
INTRAVENOUS | Status: AC
  Filled 2013-11-27: qty 50

## 2013-11-27 MED ORDER — METOCLOPRAMIDE HCL 10 MG PO TABS: 5.0000 mg | ORAL_TABLET | Freq: Three times a day (TID) | ORAL | Status: DC | PRN

## 2013-11-27 MED ORDER — MIDAZOLAM HCL 5 MG/5ML IJ SOLN
INTRAMUSCULAR | Status: DC | PRN
  Administered 2013-11-27 (×2): 1 mg via INTRAVENOUS

## 2013-11-27 MED ORDER — ALUM & MAG HYDROXIDE-SIMETH 200-200-20 MG/5ML PO SUSP: 30.0000 mL | ORAL | Status: DC | PRN

## 2013-11-27 MED ORDER — METOCLOPRAMIDE HCL 5 MG/ML IJ SOLN
5.0000 mg | Freq: Three times a day (TID) | INTRAMUSCULAR | Status: DC | PRN
  Administered 2013-11-27: 10 mg via INTRAVENOUS
  Filled 2013-11-27: qty 2

## 2013-11-27 MED ORDER — PHENOL 1.4 % MT LIQD: 1.0000 | OROMUCOSAL | Status: DC | PRN

## 2013-11-27 MED ORDER — SODIUM CHLORIDE 0.9 % IV SOLN
INTRAVENOUS | Status: DC
  Administered 2013-11-27: 22:00:00 via INTRAVENOUS
  Filled 2013-11-27 (×3): qty 1000

## 2013-11-27 MED ORDER — FENTANYL CITRATE 0.05 MG/ML IJ SOLN
INTRAMUSCULAR | Status: DC | PRN
  Administered 2013-11-27 (×2): 25 ug via INTRAVENOUS
  Administered 2013-11-27: 50 ug via INTRAVENOUS

## 2013-11-27 MED ORDER — BISACODYL 10 MG RE SUPP: 10.0000 mg | Freq: Every day | RECTAL | Status: DC | PRN

## 2013-11-27 MED ORDER — DEXAMETHASONE SODIUM PHOSPHATE 10 MG/ML IJ SOLN
10.0000 mg | Freq: Once | INTRAMUSCULAR | Status: AC
  Administered 2013-11-27: 10 mg via INTRAVENOUS

## 2013-11-27 MED ORDER — MEPERIDINE HCL 50 MG/ML IJ SOLN: 6.2500 mg | INTRAMUSCULAR | Status: DC | PRN

## 2013-11-27 MED ORDER — ONDANSETRON HCL 4 MG/2ML IJ SOLN: 4.0000 mg | Freq: Four times a day (QID) | INTRAMUSCULAR | Status: DC | PRN

## 2013-11-27 MED ORDER — SODIUM CHLORIDE 0.9 % IJ SOLN
INTRAMUSCULAR | Status: AC
  Filled 2013-11-27: qty 50

## 2013-11-27 MED ORDER — SODIUM CHLORIDE 0.9 % IJ SOLN
INTRAMUSCULAR | Status: DC | PRN
  Administered 2013-11-27: 14 mL via INTRAVENOUS

## 2013-11-27 MED ORDER — KETOROLAC TROMETHAMINE 30 MG/ML IJ SOLN
INTRAMUSCULAR | Status: AC
  Filled 2013-11-27: qty 1

## 2013-11-27 MED ORDER — 0.9 % SODIUM CHLORIDE (POUR BTL) OPTIME
TOPICAL | Status: DC | PRN
  Administered 2013-11-27: 1000 mL

## 2013-11-27 MED ORDER — ONDANSETRON HCL 4 MG PO TABS
4.0000 mg | ORAL_TABLET | Freq: Four times a day (QID) | ORAL | Status: DC | PRN
  Administered 2013-11-27 – 2013-11-28 (×2): 4 mg via ORAL
  Filled 2013-11-27 (×2): qty 1

## 2013-11-27 MED ORDER — FAMOTIDINE 40 MG PO TABS
40.0000 mg | ORAL_TABLET | Freq: Every day | ORAL | Status: DC
  Administered 2013-11-28: 40 mg via ORAL
  Filled 2013-11-27: qty 1

## 2013-11-27 MED ORDER — METHOCARBAMOL 500 MG PO TABS
500.0000 mg | ORAL_TABLET | Freq: Four times a day (QID) | ORAL | Status: DC | PRN
  Administered 2013-11-28: 500 mg via ORAL
  Filled 2013-11-27: qty 1

## 2013-11-27 MED ORDER — FERROUS SULFATE 325 (65 FE) MG PO TABS
325.0000 mg | ORAL_TABLET | Freq: Three times a day (TID) | ORAL | Status: DC
  Filled 2013-11-27 (×4): qty 1

## 2013-11-27 MED ORDER — METHYLPREDNISOLONE ACETATE 40 MG/ML IJ SUSP
INTRAMUSCULAR | Status: DC | PRN
  Administered 2013-11-27: 40 mg

## 2013-11-27 MED ORDER — METHYLPREDNISOLONE ACETATE 40 MG/ML IJ SUSP
INTRAMUSCULAR | Status: AC
  Filled 2013-11-27: qty 1

## 2013-11-27 MED ORDER — VALSARTAN-HYDROCHLOROTHIAZIDE 160-12.5 MG PO TABS: 1.0000 | ORAL_TABLET | Freq: Every day | ORAL | Status: DC

## 2013-11-27 MED ORDER — LACTATED RINGERS IV SOLN
INTRAVENOUS | Status: DC
  Administered 2013-11-27: 15:00:00 via INTRAVENOUS
  Administered 2013-11-27: 1000 mL via INTRAVENOUS

## 2013-11-27 MED ORDER — CHLORHEXIDINE GLUCONATE 4 % EX LIQD: 60.0000 mL | Freq: Once | CUTANEOUS | Status: DC

## 2013-11-27 MED ORDER — KETOROLAC TROMETHAMINE 15 MG/ML IJ SOLN
INTRAMUSCULAR | Status: DC | PRN
  Administered 2013-11-27: 15 mg via INTRAVENOUS

## 2013-11-27 MED ORDER — DEXAMETHASONE SODIUM PHOSPHATE 10 MG/ML IJ SOLN
10.0000 mg | Freq: Once | INTRAMUSCULAR | Status: AC
  Administered 2013-11-28: 10 mg via INTRAVENOUS
  Filled 2013-11-27: qty 1

## 2013-11-27 MED ORDER — ZOLPIDEM TARTRATE 5 MG PO TABS: 5.0000 mg | ORAL_TABLET | Freq: Every evening | ORAL | Status: DC | PRN

## 2013-11-27 MED ORDER — FLEET ENEMA 7-19 GM/118ML RE ENEM: 1.0000 | ENEMA | Freq: Once | RECTAL | Status: AC | PRN

## 2013-11-27 MED ORDER — HYDROMORPHONE HCL PF 1 MG/ML IJ SOLN: 0.5000 mg | INTRAMUSCULAR | Status: DC | PRN

## 2013-11-27 MED ORDER — CEFAZOLIN SODIUM-DEXTROSE 2-3 GM-% IV SOLR
2.0000 g | INTRAVENOUS | Status: AC
  Administered 2013-11-27: 2 g via INTRAVENOUS

## 2013-11-27 SURGICAL SUPPLY — 53 items
ADH SKN CLS APL DERMABOND .7 (GAUZE/BANDAGES/DRESSINGS) ×1
BAG SPEC THK2 15X12 ZIP CLS (MISCELLANEOUS) ×1
BAG ZIPLOCK 12X15 (MISCELLANEOUS) ×2 IMPLANT
BANDAGE ELASTIC 6 VELCRO ST LF (GAUZE/BANDAGES/DRESSINGS) ×2 IMPLANT
BANDAGE ESMARK 6X9 LF (GAUZE/BANDAGES/DRESSINGS) ×1 IMPLANT
BLADE SAW RECIPROCATING 77.5 (BLADE) ×2 IMPLANT
BLADE SAW SGTL 13.0X1.19X90.0M (BLADE) ×2 IMPLANT
BNDG CMPR 9X6 STRL LF SNTH (GAUZE/BANDAGES/DRESSINGS) ×1
BNDG ESMARK 6X9 LF (GAUZE/BANDAGES/DRESSINGS) ×2
BOWL SMART MIX CTS (DISPOSABLE) ×2 IMPLANT
CAPT KNEE OXFORD ×1 IMPLANT
CEMENT HV SMART SET (Cement) ×2 IMPLANT
CUFF TOURN SGL QUICK 34 (TOURNIQUET CUFF) ×2
CUFF TRNQT CYL 34X4X40X1 (TOURNIQUET CUFF) ×1 IMPLANT
DERMABOND ADVANCED (GAUZE/BANDAGES/DRESSINGS) ×1
DERMABOND ADVANCED .7 DNX12 (GAUZE/BANDAGES/DRESSINGS) ×1 IMPLANT
DRAPE EXTREMITY T 121X128X90 (DRAPE) ×2 IMPLANT
DRAPE POUCH INSTRU U-SHP 10X18 (DRAPES) ×2 IMPLANT
DRAPE U-SHAPE 47X51 STRL (DRAPES) ×2 IMPLANT
DRSG AQUACEL AG ADV 3.5X10 (GAUZE/BANDAGES/DRESSINGS) ×1 IMPLANT
DRSG TEGADERM 4X4.75 (GAUZE/BANDAGES/DRESSINGS) IMPLANT
DURAPREP 26ML APPLICATOR (WOUND CARE) ×4 IMPLANT
ELECT REM PT RETURN 9FT ADLT (ELECTROSURGICAL) ×2
ELECTRODE REM PT RTRN 9FT ADLT (ELECTROSURGICAL) ×1 IMPLANT
EVACUATOR 1/8 PVC DRAIN (DRAIN) ×2 IMPLANT
FACESHIELD LNG OPTICON STERILE (SAFETY) ×8 IMPLANT
GAUZE SPONGE 2X2 8PLY STRL LF (GAUZE/BANDAGES/DRESSINGS) IMPLANT
GLOVE BIOGEL PI IND STRL 7.5 (GLOVE) ×1 IMPLANT
GLOVE BIOGEL PI IND STRL 8 (GLOVE) ×1 IMPLANT
GLOVE BIOGEL PI INDICATOR 7.5 (GLOVE) ×1
GLOVE BIOGEL PI INDICATOR 8 (GLOVE) ×1
GLOVE ECLIPSE 8.0 STRL XLNG CF (GLOVE) ×2 IMPLANT
GLOVE ORTHO TXT STRL SZ7.5 (GLOVE) ×4 IMPLANT
GOWN BRE IMP PREV XXLGXLNG (GOWN DISPOSABLE) ×2 IMPLANT
GOWN PREVENTION PLUS LG XLONG (DISPOSABLE) ×2 IMPLANT
KIT BASIN OR (CUSTOM PROCEDURE TRAY) ×2 IMPLANT
LEGGING LITHOTOMY PAIR STRL (DRAPES) ×2 IMPLANT
MANIFOLD NEPTUNE II (INSTRUMENTS) ×2 IMPLANT
NDL SAFETY ECLIPSE 18X1.5 (NEEDLE) ×1 IMPLANT
NEEDLE HYPO 18GX1.5 SHARP (NEEDLE) ×2
PACK TOTAL JOINT (CUSTOM PROCEDURE TRAY) ×2 IMPLANT
POSITIONER SURGICAL ARM (MISCELLANEOUS) ×2 IMPLANT
SPONGE GAUZE 2X2 STER 10/PKG (GAUZE/BANDAGES/DRESSINGS)
SUCTION FRAZIER TIP 10 FR DISP (SUCTIONS) ×2 IMPLANT
SUT MNCRL AB 4-0 PS2 18 (SUTURE) ×2 IMPLANT
SUT VIC AB 1 CT1 36 (SUTURE) ×2 IMPLANT
SUT VIC AB 2-0 CT1 27 (SUTURE) ×4
SUT VIC AB 2-0 CT1 TAPERPNT 27 (SUTURE) ×2 IMPLANT
SUT VLOC 180 0 24IN GS25 (SUTURE) ×2 IMPLANT
SYR 50ML LL SCALE MARK (SYRINGE) ×2 IMPLANT
TOWEL OR 17X26 10 PK STRL BLUE (TOWEL DISPOSABLE) ×2 IMPLANT
TOWEL OR NON WOVEN STRL DISP B (DISPOSABLE) ×1 IMPLANT
TRAY FOLEY CATH 14FRSI W/METER (CATHETERS) ×1 IMPLANT

## 2013-11-27 NOTE — Preoperative (Signed)
Beta Blockers   Reason not to administer Beta Blockers:Not Applicable 

## 2013-11-27 NOTE — Anesthesia Postprocedure Evaluation (Signed)
Anesthesia Post Note  Patient: Jordan Lane  Procedure(s) Performed: Procedure(s) (LRB): UNICOMPARTMENTAL RIGHT KNEE, Steroid injection in right great toe (Right)  Anesthesia type: Spinal  Patient location: PACU  Post pain: Pain level controlled  Post assessment: Post-op Vital signs reviewed  Last Vitals: BP 128/80  Pulse 85  Temp(Src) 36.7 C (Oral)  Resp 16  Ht 5\' 3"  (1.6 m)  Wt 176 lb (79.833 kg)  BMI 31.18 kg/m2  SpO2 97%  Post vital signs: Reviewed  Level of consciousness: sedated  Complications: No apparent anesthesia complications

## 2013-11-27 NOTE — Anesthesia Procedure Notes (Addendum)
Spinal  Patient location during procedure: OR Start time: 11/27/2013 2:49 PM End time: 11/27/2013 2:52 PM Staffing Anesthesiologist: Lewie Loron R Performed by: anesthesiologist  Preanesthetic Checklist Completed: patient identified, site marked, surgical consent, pre-op evaluation, timeout performed, IV checked, risks and benefits discussed and monitors and equipment checked Spinal Block Patient position: sitting Prep: Betadine Patient monitoring: heart rate, continuous pulse ox and blood pressure Approach: midline Location: L3-4 Injection technique: single-shot Needle Needle type: Quincke  Needle gauge: 22 G Needle length: 9 cm Additional Notes Expiration date of kit checked and confirmed. Patient tolerated procedure well, without complications.

## 2013-11-27 NOTE — Anesthesia Preprocedure Evaluation (Signed)
Anesthesia Evaluation  Patient identified by MRN, date of birth, ID band Patient awake    Reviewed: Allergy & Precautions, H&P , NPO status , Patient's Chart, lab work & pertinent test results  Airway Mallampati: II TM Distance: <3 FB Neck ROM: Full    Dental no notable dental hx. (+) Dental Advisory Given   Pulmonary neg pulmonary ROS,  breath sounds clear to auscultation  Pulmonary exam normal       Cardiovascular hypertension, Pt. on medications + dysrhythmias Rhythm:Regular Rate:Normal     Neuro/Psych negative neurological ROS  negative psych ROS   GI/Hepatic Neg liver ROS, GERD-  ,  Endo/Other  negative endocrine ROS  Renal/GU negative Renal ROS     Musculoskeletal negative musculoskeletal ROS (+)   Abdominal   Peds  Hematology negative hematology ROS (+)   Anesthesia Other Findings   Reproductive/Obstetrics negative OB ROS                           Anesthesia Physical  Anesthesia Plan  ASA: II  Anesthesia Plan: Spinal   Post-op Pain Management:    Induction:   Airway Management Planned: Simple Face Mask  Additional Equipment:   Intra-op Plan:   Post-operative Plan:   Informed Consent: I have reviewed the patients History and Physical, chart, labs and discussed the procedure including the risks, benefits and alternatives for the proposed anesthesia with the patient or authorized representative who has indicated his/her understanding and acceptance.     Plan Discussed with: CRNA  Anesthesia Plan Comments:         Anesthesia Quick Evaluation

## 2013-11-27 NOTE — Plan of Care (Signed)
Problem: Consults Goal: Diagnosis- Total Joint Replacement Right uni knee     

## 2013-11-27 NOTE — Op Note (Signed)
NAME: Jordan Lane    MEDICAL RECORD NO.: [id number]   FACILITY: [facility]   DATE OF BIRTH: 06-25-1946  PHYSICIAN: Madlyn Frankel. Charlann Boxer, M.D.    DATE OF PROCEDURE: 11/27/2013    OPERATIVE REPORT   PREOPERATIVE DIAGNOSIS: Right knee medial compartment osteoarthritis.   POSTOPERATIVE DIAGNOSIS: Right knee medial compartment osteoarthritis.  PROCEDURE: Right partial knee replacement utilizing Biomet Oxford knee  component, size small femur, a right medial size A tibial tray with a size 6 insert.   SURGEON: Madlyn Frankel. Charlann Boxer, M.D.   ASSISTANT: Lanney Gins, PAC.  Please note that Mr. Jordan Lane was present for the entirety of the case,  utilized for preoperative positioning, perioperative retractor  management, general facilitation of the case and primary wound closure.   ANESTHESIA: Spinal.   SPECIMENS: None.   COMPLICATIONS: None.  DRAINS: None   TOURNIQUET TIME: 35 minutes at 250 mmHg.   INDICATIONS FOR PROCEDURE: The patient is a 75 patient of mine who presented for evaluation of right knee pain.  She has a history of left partial knee replacement. She now presented with primary complaints of pain on the medial side of their knee. Radiographs revealed advanced medial compartment arthritis with specifically an antero-medial wear pattern.  There was bone on bone changes noted with subchondral sclerosis and osteophytes present. The patient has had progressive problems failing to respond to conservative measures of medications, injections and activity modification. Risks of infection, DVT, component failure, need for future revision surgery were all discussed and reviewed.  Consent was obtained for benefit of pain relief.   PROCEDURE IN DETAIL: The patient was brought to the operative theater.  Once adequate anesthesia, preoperative antibiotics, 2gm Ancef administered, the patient was positioned in supine position with a right thigh tourniquet  placed. The right lower extremity was  prepped and draped in sterile  fashion with the leg on the Oxford leg holder.  The leg was allowed to flex to 120 degrees. A time-out  was performed identifying the patient, planned procedure, and extremity.  The leg was exsanguinated, tourniquet elevated to 250 mmHg. A midline  incision was made from the proximal pole of the patella to the tibial tubercle. A  soft tissue plane was created and partial median arthrotomy was then  made to allow for subluxation of the patella. Following initial synovectomy and  debridement, the osteophytes were removed off the medial aspect of the  knee.   Attention was first directed to the tibia. The tibial  extramedullary guide was positioned over the anterior crest of the tibia  and pinned into position, and using a measured resection guide from the  Oxford system, a 4 mm resection was made off the proximal tibia. First  the reciprocating saw along the medial aspect of the tibial spines, then the oscillating saw.    At this point, I sized this cut surface seem to be best fit for a size A tibial tray.  With the retractors out of the wound and the knee held at 90 degrees the 5 feeler gauge had appropriate tension on the medial ligament.   At this point, the femoral canal was opened with a drill and the  intramedullary rod passed. Then using the guide for a small mm resection off  the posterior aspect of the femur was positioned over the mid portion of the medial femoral condyle.  The orientation was set using the guide that mates the femoral guide to the intramedullary rod.  The 2 drill holes were made into  the distal femur.  The posterior guide was then impacted into place and the posterior  femoral cut made.  At this point, I milled the distal femur with a size 5 spigot in place. At this point, we did a trial reduction of the small femur, size A tibial tray and at first the 5 feeler gauge then the size 6t. At 90 degrees of  flexion and at 20 degrees of  flexion the knee had symmetric tension on  the ligaments.   Given these findings, the trial femoral component was removed. Final preparation of tibia was carried out by pinning it in position. Then  using a reciprocating saw I removed bone for the keel. Further bone was  removed with an osteotome.  Trial reduction was now carried out with the small femur, the keeled A right medial tibia, and the size 6 lollipop insert. The balance of the  ligaments appeared to be symmetric at 20 degrees and 90 degrees. Given  all these findings, the trial components were removed.   Cement was mixed. The final components were opened. The knee was irrigated with  normal saline solution. Then final debridements of the  soft tissue was carried out, I also drilled the sclerotic bone with a drill. The synovial capsular junction of the knee was injected with 20cc of Exparel, 30cc of marcaine and 1xx of Toradol.   The final components were cemented with a single batch of cement in a  two-stage technique with the tibial component cemented first. The knee  was then brought  to 45 degrees of flexion with a 5 feeler gauge, held with pressure for a minute and half.  After this the femoral component was cemented in place.  The knee was again held at 45 degrees of flexion while the cement fully cured.  Excess cement was removed throughout the knee. Tourniquet was let down  after 35 minutes. After the cement had fully cured and excessive cement  was removed throughout the knee there was no visualized cement present.   The final size 6 right medial insert was chosen and snapped into position. We re-irrigated  the knee. The extensor mechanism  was then reapproximated using a #1 Vicryl with the knee in flexion. The  remaining wound was closed with 2-0 Vicryl and a running 4-0 Monocryl.  The knee was cleaned, dried, and dressed sterilely using Dermabond and  Aquacel dressing. The patient  was brought to the recovery room,  Ace wrap in place, tolerating the  procedure well. He will be in the hospital for overnight observation.  We will initiate physical therapy and progress to ambulate.     Madlyn Frankel Charlann Boxer, M.D.

## 2013-11-27 NOTE — Interval H&P Note (Signed)
History and Physical Interval Note:  11/27/2013 1:51 PM  Jordan Lane  has presented today for surgery, with the diagnosis of RIGHT KNEE MEDIAL COMPARTMENTAL OA  The various methods of treatment have been discussed with the patient and family. After consideration of risks, benefits and other options for treatment, the patient has consented to  Procedure(s): UNICOMPARTMENTAL RIGHT KNEE (Right) as a surgical intervention .  The patient's history has been reviewed, patient examined, no change in status, stable for surgery.  I have reviewed the patient's chart and labs.  Questions were answered to the patient's satisfaction.     Shelda Pal

## 2013-11-27 NOTE — Transfer of Care (Signed)
Immediate Anesthesia Transfer of Care Note  Patient: Jordan Lane  Procedure(s) Performed: Procedure(s): UNICOMPARTMENTAL RIGHT KNEE, Steroid injection in right great toe (Right)  Patient Location: PACU  Anesthesia Type:Spinal  Level of Consciousness: awake and patient cooperative  Airway & Oxygen Therapy: Patient Spontanous Breathing and Patient connected to face mask oxygen  Post-op Assessment: Report given to PACU RN and Post -op Vital signs reviewed and stable  Post vital signs: Reviewed and stable  Complications: No apparent anesthesia complications

## 2013-11-28 ENCOUNTER — Encounter (HOSPITAL_COMMUNITY): Payer: Self-pay | Admitting: Orthopedic Surgery

## 2013-11-28 DIAGNOSIS — E669 Obesity, unspecified: Secondary | ICD-10-CM | POA: Diagnosis present

## 2013-11-28 LAB — CBC
HCT: 36.2 % (ref 36.0–46.0)
Hemoglobin: 12.6 g/dL (ref 12.0–15.0)
MCH: 30.5 pg (ref 26.0–34.0)
MCHC: 34.8 g/dL (ref 30.0–36.0)
MCV: 87.7 fL (ref 78.0–100.0)
Platelets: 290 K/uL (ref 150–400)
RBC: 4.13 MIL/uL (ref 3.87–5.11)
RDW: 13.2 % (ref 11.5–15.5)
WBC: 16.4 K/uL — ABNORMAL HIGH (ref 4.0–10.5)

## 2013-11-28 LAB — BASIC METABOLIC PANEL WITH GFR
BUN: 10 mg/dL (ref 6–23)
CO2: 25 meq/L (ref 19–32)
Calcium: 8.8 mg/dL (ref 8.4–10.5)
Chloride: 106 meq/L (ref 96–112)
Creatinine, Ser: 0.67 mg/dL (ref 0.50–1.10)
GFR calc Af Amer: >90 mL/min
GFR calc non Af Amer: 88 mL/min — ABNORMAL LOW
Glucose, Bld: 157 mg/dL — ABNORMAL HIGH (ref 70–99)
Potassium: 4 meq/L (ref 3.7–5.3)
Sodium: 142 meq/L (ref 137–147)

## 2013-11-28 MED ORDER — DSS 100 MG PO CAPS: 100.0000 mg | ORAL_CAPSULE | Freq: Two times a day (BID) | ORAL | Status: DC

## 2013-11-28 MED ORDER — PROMETHAZINE HCL 25 MG/ML IJ SOLN
INTRAMUSCULAR | Status: AC
  Filled 2013-11-28: qty 1

## 2013-11-28 MED ORDER — TIZANIDINE HCL 4 MG PO CAPS: 4.0000 mg | ORAL_CAPSULE | Freq: Three times a day (TID) | ORAL | Status: DC | PRN

## 2013-11-28 MED ORDER — POLYETHYLENE GLYCOL 3350 17 G PO PACK: 17.0000 g | PACK | Freq: Two times a day (BID) | ORAL | Status: DC

## 2013-11-28 MED ORDER — FERROUS SULFATE 325 (65 FE) MG PO TABS: 325.0000 mg | ORAL_TABLET | Freq: Three times a day (TID) | ORAL | Status: DC

## 2013-11-28 MED ORDER — PROMETHAZINE HCL 12.5 MG PO TABS: 12.5000 mg | ORAL_TABLET | ORAL | Status: DC | PRN

## 2013-11-28 MED ORDER — ASPIRIN 325 MG PO TBEC: 325.0000 mg | DELAYED_RELEASE_TABLET | Freq: Two times a day (BID) | ORAL | Status: AC

## 2013-11-28 MED ORDER — HYDROMORPHONE HCL 2 MG PO TABS: 2.0000 mg | ORAL_TABLET | ORAL | Status: DC | PRN

## 2013-11-28 MED ORDER — PROMETHAZINE HCL 25 MG/ML IJ SOLN
12.5000 mg | Freq: Four times a day (QID) | INTRAMUSCULAR | Status: DC | PRN
  Administered 2013-11-28: 12.5 mg via INTRAVENOUS

## 2013-11-28 MED ORDER — HYDROMORPHONE HCL 2 MG PO TABS
2.0000 mg | ORAL_TABLET | ORAL | Status: DC | PRN
  Administered 2013-11-28: 2 mg via ORAL
  Filled 2013-11-28: qty 1

## 2013-11-28 NOTE — Care Management Note (Signed)
    Page 1 of 1   11/28/2013     2:17:11 PM   CARE MANAGEMENT NOTE 11/28/2013  Patient:  Jordan Lane,Jordan Lane   Account Number:  [id number]  Date Initiated:  11/28/2013  Documentation initiated by:  Colleen Can  Subjective/Objective Assessment:   dx rt partial knee replacemnt     Action/Plan:   CM spoke with patient & spouse. Plans are for her to return to her home where spouse will be caregiver. She already has DME. Wants [facility] HH care because will have new insurance in new year and they are in network.   Anticipated DC Date:  11/30/2013   Anticipated DC Plan:  HOME W HOME HEALTH SERVICES      DC Planning Services  CM consult      [facility] Choice  HOME HEALTH   Choice offered to / List presented to:  C-1 Patient        HH arranged  HH-2 PT      [facility] agency  [facility]   Status of service:  Completed, signed off Medicare Important Message given?   (If response is "NO", the following Medicare IM given date fields will be blank) Date Medicare IM given:   Date Additional Medicare IM given:    Discharge Disposition:    Per UR Regulation:    If discussed at Long Length of Stay Meetings, dates discussed:    Comments:  11/28/2013 Colleen Can BSN RN CCM [phone] CM called [facility] rep to request services. States they can provide services and are in network with [employer].

## 2013-11-28 NOTE — Evaluation (Signed)
Physical Therapy Evaluation Patient Details Name: Jordan Lane MRN: [id number] DOB: March 04, 1946 Today's Date: 11/28/2013 Time: 1324-4010 PT Time Calculation (min): 15 min  PT Assessment / Plan / Recommendation History of Present Illness     Clinical Impression  Patient is s/p R unicompartmental knee surgery resulting in functional limitations due to the deficits listed below (see PT Problem List).  Patient will benefit from skilled PT to increase their independence and safety with mobility to allow discharge to the venue listed below. Pt mobilizing well POD #1.  Spouse and pt report possible d/c home later today.        PT Assessment  Patient needs continued PT services    Follow Up Recommendations  Home health PT    Does the patient have the potential to tolerate intense rehabilitation      Barriers to Discharge        Equipment Recommendations  None recommended by PT    Recommendations for Other Services     Frequency 7X/week    Precautions / Restrictions Precautions Precautions: Knee Restrictions Other Position/Activity Restrictions: WBAT   Pertinent Vitals/Pain 3/10 R knee pain, repositioned, ice packs applied      Mobility  Bed Mobility Bed Mobility: Supine to Sit Supine to Sit: 5: Supervision Details for Bed Mobility Assistance: verbal cues for self assist R LE Transfers Transfers: Sit to Stand;Stand to Sit Sit to Stand: 4: Min guard;With upper extremity assist;From bed Stand to Sit: 4: Min guard;With upper extremity assist;To chair/3-in-1 Details for Transfer Assistance: verbal cues for hand placement Ambulation/Gait Ambulation/Gait Assistance: 4: Min guard Ambulation Distance (Feet): 80 Feet Assistive device: Rolling walker Ambulation/Gait Assistance Details: verbal cues for sequence, step length, RW distance Gait Pattern: Step-to pattern;Antalgic    Exercises     PT Diagnosis: Difficulty walking;Acute pain  PT Problem List: Decreased  strength;Decreased range of motion;Decreased mobility;Pain PT Treatment Interventions: Stair training;Gait training;DME instruction;Functional mobility training;Therapeutic activities;Therapeutic exercise;Patient/family education     PT Goals(Current goals can be found in the care plan section) Acute Rehab PT Goals PT Goal Formulation: With patient Time For Goal Achievement: 12/02/13 Potential to Achieve Goals: Good  Visit Information  Last PT Received On: 11/28/13 Assistance Needed: +1       Prior Functioning  Home Living Family/patient expects to be discharged to:: Private residence Living Arrangements: Spouse/significant other Type of Home: House Home Access: Stairs to enter Secretary/administrator of Steps: 3 Entrance Stairs-Rails: Can reach both;Left;Right Home Layout: Two level;Able to live on main level with bedroom/bathroom Home Equipment: Walker - 2 wheels Prior Function Level of Independence: Independent Communication Communication: No difficulties    Cognition  Cognition Arousal/Alertness: Awake/alert Behavior During Therapy: WFL for tasks assessed/performed Overall Cognitive Status: Within Functional Limits for tasks assessed    Extremity/Trunk Assessment Lower Extremity Assessment Lower Extremity Assessment: RLE deficits/detail RLE Deficits / Details: good quad strength, able to perform SLR, observed at least 90* flexion during functional activity   Balance    End of Session PT - End of Session Activity Tolerance: Patient tolerated treatment well Patient left: in chair;with call bell/phone within reach;with family/visitor present  GP     Jordan Lane,Jordan Lane 11/28/2013, 11:59 AM Jordan Lane, PT, DPT 11/28/2013 Pager: [phone]

## 2013-11-28 NOTE — Progress Notes (Signed)
Physical Therapy Treatment Note   11/28/13 1358  PT Visit Information  Last PT Received On 11/28/13  Assistance Needed +1  PT Time Calculation  PT Start Time 1326  PT Stop Time 1351  PT Time Calculation (min) 25 min  Subjective Data  Subjective Pt ambulated again in hallway, practiced stairs and performed exercises.  Pt given handout on HEP.  Pt and spouse had no further questions/concerns and ready to d/c home today.  Precautions  Precautions Knee  Restrictions  Other Position/Activity Restrictions WBAT  Cognition  Arousal/Alertness Awake/alert  Behavior During Therapy WFL for tasks assessed/performed  Overall Cognitive Status Within Functional Limits for tasks assessed  Bed Mobility  Bed Mobility Not assessed  Transfers  Transfers Sit to Stand;Stand to Sit  Sit to Stand 5: Supervision;With upper extremity assist;From chair/3-in-1  Stand to Sit 5: Supervision;With upper extremity assist;To chair/3-in-1  Details for Transfer Assistance verbal cues for hand placement  Ambulation/Gait  Ambulation/Gait Assistance 5: Supervision  Ambulation Distance (Feet) 160 Feet  Assistive device Rolling walker  Ambulation/Gait Assistance Details verbal cues for RW distance  Gait Pattern Step-through pattern;Antalgic  Stairs Yes  Stairs Assistance 4: Min guard  Stairs Assistance Details (indicate cue type and reason) verbal cues for safety and sequence  Stair Management Technique Step to pattern;Forwards;Two rails  Number of Stairs 3  Exercises  Exercises Total Joint  Total Joint Exercises  Ankle Circles/Pumps AROM;10 reps;Supine;Both  [employer] AROM;15 reps;Right  Short Arc Quad AROM;Right;15 reps  Heel Slides AROM;10 reps;Right  Hip ABduction/ADduction AROM;Right;15 reps  Straight Leg Raises AROM;15 reps;Right  PT - End of Session  Activity Tolerance Patient tolerated treatment well  Patient left in chair;with call bell/phone within reach;with family/visitor present  PT -  Assessment/Plan  PT Plan Current plan remains appropriate  PT Frequency 7X/week  Follow Up Recommendations Home health PT  PT equipment None recommended by PT  PT Goal Progression  Progress towards PT goals Progressing toward goals  PT General Charges  $$ ACUTE PT VISIT 1 Procedure  PT Treatments  $Gait Training 8-22 mins  $Therapeutic Exercise 8-22 mins   Zenovia Jarred, PT, DPT 11/28/2013 Pager: [phone]

## 2013-11-28 NOTE — Progress Notes (Signed)
   Subjective: 1 Day Post-Op Procedure(s) (LRB): UNICOMPARTMENTAL RIGHT KNEE, Steroid injection in right great toe (Right)   Patient reports pain as mild, pain controlled. She is having issues with nausea and vomiting with taking the hydrocodone. Discussion was had to try Dilaudid by mouth to see if this will curb her N&V. No events throughout the night. Ready to be discharged home.  Objective:   VITALS:   Filed Vitals:   11/28/13 0551  BP: 125/79  Pulse: 72  Temp: 97.3 F (36.3 C)  Resp: 16    Neurovascular intact Dorsiflexion/Plantar flexion intact Incision: dressing C/D/I No cellulitis present Compartment soft  LABS  Recent Labs  11/28/13 0501  HGB 12.6  HCT 36.2  WBC 16.4*  PLT 290     Recent Labs  11/28/13 0501  NA 142  K 4.0  BUN 10  CREATININE 0.67  GLUCOSE 157*     Assessment/Plan: 1 Day Post-Op Procedure(s) (LRB): UNICOMPARTMENTAL RIGHT KNEE, Steroid injection in right great toe (Right) Advance diet Up with therapy D/C IV fluids Discharge home with home health Follow up in 2 weeks at [facility]. Follow up with OLIN,Nekeshia Lenhardt D in 2 weeks.  Contact information:  [facility] [address], [address] [place] [address] [phone]    Obese (BMI 30-39.9)  Estimated body mass index is 31.18 kg/(m^2) as calculated from the following:   Height as of this encounter: 5\' 3"  (1.6 m).   Weight as of this encounter: 79.833 kg (176 lb). Patient also counseled that weight may inhibit the healing process Patient counseled that losing weight will help with future health issues        Anastasio Auerbach. Mariachristina Holle   PAC  11/28/2013, 7:37 AM

## 2013-11-28 NOTE — Progress Notes (Signed)
Utilization review completed.  

## 2013-11-28 NOTE — Progress Notes (Signed)
OT Cancellation Note  Patient Details Name: Jordan Lane MRN: [id number] DOB: 1946-07-14   Cancelled Treatment:    Reason Eval/Treat Not Completed: PT screened, no needs identified, will sign off  Donnalynn Wheeless 11/28/2013, 12:44 PM Marica Otter, OTR/L [phone] 11/28/2013

## 2013-12-01 NOTE — Discharge Summary (Signed)
Physician Discharge Summary  Patient ID: Jordan Lane MRN: [id number] DOB/AGE: 1946/02/10 68 y.o.  Admit date: 11/27/2013 Discharge date: 11/28/2013   Procedures:  Procedure(s) (LRB): UNICOMPARTMENTAL RIGHT KNEE, Steroid injection in right great toe (Right)  Attending Physician:  Dr. Paralee Cancel   Admission Diagnoses:   Right knee medial compartment OA / pain  Discharge Diagnoses:  Principal Problem:   S/P right UKR Active Problems:   Obese  Past Medical History  Diagnosis Date  . Hypertension   . Fibroadenoma of breast     Right  . Reflux   . GERD (gastroesophageal reflux disease)   . Arthritis   . History of blood transfusion 48 yrs ago    HPI: Jordan Lane, 68 y.o. female, has a history of pain and functional disability in the right knee due to arthritis and has failed non-surgical conservative treatments for greater than 12 weeks to includeNSAID's and/or analgesics and activity modification. Onset of symptoms was gradual, starting years ago with gradually worsening course since that time. The patient noted prior procedures on the knee to include arthroscopy on the right knee(s). Patient currently rates pain in the right knee(s) at 8 out of 10 with activity. Patient has worsening of pain with activity and weight bearing, pain that interferes with activities of daily living, pain with passive range of motion, crepitus and joint swelling. Patient has evidence of periarticular osteophytes and joint space narrowing of the medial compartment by imaging studies. There is no active infection. Risks, benefits and expectations were discussed with the patient. Risks including but not limited to the risk of anesthesia, blood clots, nerve damage, blood vessel damage, failure of the prosthesis, infection and up to and including death. Patient understand the risks, benefits and expectations and wishes to proceed with surgery.   PCP: Irven Shelling, MD   Discharged  Condition: good  Hospital Course:  Patient underwent the above stated procedure on 11/27/2013. Patient tolerated the procedure well and brought to the recovery room in good condition and subsequently to the floor.  POD #1 BP: 125/79 ; Pulse: 72 ; Temp: 97.3 F (36.3 C) ; Resp: 16  Patient reports pain as mild, pain controlled. She is having issues with nausea and vomiting with taking the hydrocodone. Discussion was had to try Dilaudid by mouth to see if this will curb her N&V. No events throughout the night. Ready to be discharged home. Neurovascular intact, dorsiflexion/plantar flexion intact, incision: dressing C/D/I, no cellulitis present and compartment soft.   LABS  Basename    HGB  12.6  HCT  36.2    Discharge Exam: General appearance: alert, cooperative and no distress Extremities: Homans sign is negative, no sign of DVT, no edema, redness or tenderness in the calves or thighs and no ulcers, gangrene or trophic changes  Disposition:    Home-Health Care Svc with follow up in 2 weeks   Follow-up Information   Follow up with Mauri Pole, MD. Schedule an appointment as soon as possible for a visit in 2 weeks.   Specialty:  Orthopedic Surgery   Contact information:   [address] [place] [address] [phone]       Discharge Orders   Future Orders Complete By Expires   Call MD / Call 911  As directed    Comments:     If you experience chest pain or shortness of breath, CALL 911 and be transported to the hospital emergency room.  If you develope a fever above  101 F, pus (white drainage) or increased drainage or redness at the wound, or calf pain, call your surgeon's office.   Change dressing  As directed    Comments:     Maintain surgical dressing for 10-14 days, or until follow up in the clinic.   Constipation Prevention  As directed    Comments:     Drink plenty of fluids.  Prune juice may be helpful.  You may use a stool softener, such as  Colace (over the counter) 100 mg twice a day.  Use MiraLax (over the counter) for constipation as needed.   Diet - low sodium heart healthy  As directed    Discharge instructions  As directed    Comments:     Maintain surgical dressing for 10-14 days, or until follow up in the clinic. Follow up in 2 weeks at [facility]. Call with any questions or concerns.   Driving restrictions  As directed    Comments:     No driving for 4 weeks   Increase activity slowly as tolerated  As directed    TED hose  As directed    Comments:     Use stockings (TED hose) for 2 weeks on both leg(s).  You may remove them at night for sleeping.   Weight bearing as tolerated  As directed    Questions:     Laterality:     Extremity:          Medication List         amLODipine 5 MG tablet  Commonly known as:  NORVASC  Take 5 mg by mouth daily after breakfast.     aspirin 325 MG EC tablet  Take 1 tablet (325 mg total) by mouth 2 (two) times daily.     CALCIUM + D PO  Take 1 tablet by mouth daily.     cholecalciferol 1000 UNITS tablet  Commonly known as:  VITAMIN D  Take 2,000 Units by mouth daily.     diphenhydrAMINE 25 mg capsule  Commonly known as:  BENADRYL  Take 25 mg by mouth every 6 (six) hours as needed for allergies.     DSS 100 MG Caps  Take 100 mg by mouth 2 (two) times daily.     ferrous sulfate 325 (65 FE) MG tablet  Take 1 tablet (325 mg total) by mouth 3 (three) times daily after meals.     glucosamine-chondroitin 500-400 MG tablet  Take 1 tablet by mouth daily.     HYDROmorphone 2 MG tablet  Commonly known as:  DILAUDID  Take 1-2 tablets (2-4 mg total) by mouth every 4 (four) hours as needed for severe pain.     LORazepam 0.5 MG tablet  Commonly known as:  ATIVAN  Take 0.5 mg by mouth every 8 (eight) hours as needed. Anxiety     multivitamin tablet  Take 1 tablet by mouth daily.     polyethylene glycol packet  Commonly known as:  MIRALAX / GLYCOLAX  Take  17 g by mouth 2 (two) times daily.     promethazine 12.5 MG tablet  Commonly known as:  PHENERGAN  Take 1-2 tablets (12.5-25 mg total) by mouth every 4 (four) hours as needed for nausea or vomiting.     ranitidine 150 MG tablet  Commonly known as:  ZANTAC  Take 300 mg by mouth daily.     tiZANidine 4 MG capsule  Commonly known as:  ZANAFLEX  Take 1 capsule (4 mg  total) by mouth 3 (three) times daily as needed for muscle spasms.     valsartan-hydrochlorothiazide 160-12.5 MG per tablet  Commonly known as:  DIOVAN-HCT  Take 1 tablet by mouth daily after breakfast.         Signed: [place] Pugh. Lakoda Raske   PAC  12/01/2013, 8:55 AM

## 2014-10-01 ENCOUNTER — Encounter (HOSPITAL_COMMUNITY): Payer: Self-pay | Admitting: Orthopedic Surgery

## 2014-12-25 DIAGNOSIS — K580 Irritable bowel syndrome with diarrhea: Secondary | ICD-10-CM | POA: Diagnosis not present

## 2015-01-29 DIAGNOSIS — I10 Essential (primary) hypertension: Secondary | ICD-10-CM | POA: Diagnosis not present

## 2015-01-29 DIAGNOSIS — H539 Unspecified visual disturbance: Secondary | ICD-10-CM | POA: Diagnosis not present

## 2015-02-18 DIAGNOSIS — H2513 Age-related nuclear cataract, bilateral: Secondary | ICD-10-CM | POA: Diagnosis not present

## 2015-02-18 DIAGNOSIS — H539 Unspecified visual disturbance: Secondary | ICD-10-CM | POA: Diagnosis not present

## 2015-02-19 DIAGNOSIS — I10 Essential (primary) hypertension: Secondary | ICD-10-CM | POA: Diagnosis not present

## 2015-02-19 DIAGNOSIS — H539 Unspecified visual disturbance: Secondary | ICD-10-CM | POA: Diagnosis not present

## 2015-02-20 DIAGNOSIS — Z96652 Presence of left artificial knee joint: Secondary | ICD-10-CM | POA: Diagnosis not present

## 2015-02-20 DIAGNOSIS — Z96651 Presence of right artificial knee joint: Secondary | ICD-10-CM | POA: Diagnosis not present

## 2015-02-20 DIAGNOSIS — Z471 Aftercare following joint replacement surgery: Secondary | ICD-10-CM | POA: Diagnosis not present

## 2015-04-17 DIAGNOSIS — I10 Essential (primary) hypertension: Secondary | ICD-10-CM | POA: Diagnosis not present

## 2015-06-11 DIAGNOSIS — Z1231 Encounter for screening mammogram for malignant neoplasm of breast: Secondary | ICD-10-CM | POA: Diagnosis not present

## 2015-09-30 DIAGNOSIS — H5210 Myopia, unspecified eye: Secondary | ICD-10-CM | POA: Diagnosis not present

## 2015-09-30 DIAGNOSIS — H524 Presbyopia: Secondary | ICD-10-CM | POA: Diagnosis not present

## 2015-10-01 DIAGNOSIS — I639 Cerebral infarction, unspecified: Secondary | ICD-10-CM

## 2015-10-01 HISTORY — DX: Cerebral infarction, unspecified: I63.9

## 2015-10-11 DIAGNOSIS — Z0000 Encounter for general adult medical examination without abnormal findings: Secondary | ICD-10-CM | POA: Diagnosis not present

## 2015-10-11 DIAGNOSIS — Z1389 Encounter for screening for other disorder: Secondary | ICD-10-CM | POA: Diagnosis not present

## 2015-10-11 DIAGNOSIS — K219 Gastro-esophageal reflux disease without esophagitis: Secondary | ICD-10-CM | POA: Diagnosis not present

## 2015-10-11 DIAGNOSIS — I10 Essential (primary) hypertension: Secondary | ICD-10-CM | POA: Diagnosis not present

## 2015-10-11 DIAGNOSIS — R7301 Impaired fasting glucose: Secondary | ICD-10-CM | POA: Diagnosis not present

## 2015-10-21 ENCOUNTER — Inpatient Hospital Stay (HOSPITAL_COMMUNITY)
Admission: EM | Admit: 2015-10-21 | Discharge: 2015-10-22 | DRG: 066 | Disposition: A | Discharge status: Home Health | Payer: Commercial Managed Care - HMO | Attending: Internal Medicine | Admitting: Internal Medicine

## 2015-10-21 ENCOUNTER — Encounter (HOSPITAL_COMMUNITY): Payer: Self-pay | Admitting: *Deleted

## 2015-10-21 ENCOUNTER — Other Ambulatory Visit: Payer: Self-pay

## 2015-10-21 ENCOUNTER — Emergency Department (HOSPITAL_COMMUNITY): Payer: Commercial Managed Care - HMO

## 2015-10-21 DIAGNOSIS — E876 Hypokalemia: Secondary | ICD-10-CM | POA: Diagnosis not present

## 2015-10-21 DIAGNOSIS — Z6830 Body mass index (BMI) 30.0-30.9, adult: Secondary | ICD-10-CM

## 2015-10-21 DIAGNOSIS — Z885 Allergy status to narcotic agent status: Secondary | ICD-10-CM | POA: Diagnosis not present

## 2015-10-21 DIAGNOSIS — I638 Other cerebral infarction: Secondary | ICD-10-CM | POA: Diagnosis not present

## 2015-10-21 DIAGNOSIS — I63211 Cerebral infarction due to unspecified occlusion or stenosis of right vertebral arteries: Secondary | ICD-10-CM | POA: Diagnosis not present

## 2015-10-21 DIAGNOSIS — R29700 NIHSS score 0: Secondary | ICD-10-CM | POA: Diagnosis present

## 2015-10-21 DIAGNOSIS — E669 Obesity, unspecified: Secondary | ICD-10-CM | POA: Diagnosis present

## 2015-10-21 DIAGNOSIS — I639 Cerebral infarction, unspecified: Secondary | ICD-10-CM | POA: Diagnosis not present

## 2015-10-21 DIAGNOSIS — Z803 Family history of malignant neoplasm of breast: Secondary | ICD-10-CM | POA: Diagnosis not present

## 2015-10-21 DIAGNOSIS — Z833 Family history of diabetes mellitus: Secondary | ICD-10-CM | POA: Diagnosis not present

## 2015-10-21 DIAGNOSIS — Z886 Allergy status to analgesic agent status: Secondary | ICD-10-CM | POA: Diagnosis not present

## 2015-10-21 DIAGNOSIS — K219 Gastro-esophageal reflux disease without esophagitis: Secondary | ICD-10-CM | POA: Diagnosis present

## 2015-10-21 DIAGNOSIS — I63212 Cerebral infarction due to unspecified occlusion or stenosis of left vertebral arteries: Secondary | ICD-10-CM | POA: Diagnosis not present

## 2015-10-21 DIAGNOSIS — I10 Essential (primary) hypertension: Secondary | ICD-10-CM | POA: Diagnosis present

## 2015-10-21 DIAGNOSIS — Z96653 Presence of artificial knee joint, bilateral: Secondary | ICD-10-CM | POA: Diagnosis present

## 2015-10-21 DIAGNOSIS — E785 Hyperlipidemia, unspecified: Secondary | ICD-10-CM | POA: Diagnosis not present

## 2015-10-21 DIAGNOSIS — R2681 Unsteadiness on feet: Secondary | ICD-10-CM | POA: Diagnosis not present

## 2015-10-21 DIAGNOSIS — I63012 Cerebral infarction due to thrombosis of left vertebral artery: Secondary | ICD-10-CM | POA: Diagnosis not present

## 2015-10-21 DIAGNOSIS — R260 Ataxic gait: Secondary | ICD-10-CM | POA: Diagnosis present

## 2015-10-21 DIAGNOSIS — Z8249 Family history of ischemic heart disease and other diseases of the circulatory system: Secondary | ICD-10-CM

## 2015-10-21 DIAGNOSIS — I672 Cerebral atherosclerosis: Secondary | ICD-10-CM | POA: Diagnosis present

## 2015-10-21 DIAGNOSIS — I6789 Other cerebrovascular disease: Secondary | ICD-10-CM | POA: Diagnosis not present

## 2015-10-21 DIAGNOSIS — I6389 Other cerebral infarction: Secondary | ICD-10-CM

## 2015-10-21 DIAGNOSIS — R42 Dizziness and giddiness: Secondary | ICD-10-CM | POA: Diagnosis not present

## 2015-10-21 LAB — CBC
HEMATOCRIT: 43.1 % (ref 36.0–46.0)
Hemoglobin: 15.2 g/dL — ABNORMAL HIGH (ref 12.0–15.0)
MCH: 31.8 pg (ref 26.0–34.0)
MCHC: 35.3 g/dL (ref 30.0–36.0)
MCV: 90.2 fL (ref 78.0–100.0)
Platelets: 282 10*3/uL (ref 150–400)
RBC: 4.78 MIL/uL (ref 3.87–5.11)
RDW: 13.1 % (ref 11.5–15.5)
WBC: 10.3 10*3/uL (ref 4.0–10.5)

## 2015-10-21 LAB — COMPREHENSIVE METABOLIC PANEL
ALK PHOS: 63 U/L (ref 38–126)
ALT: 36 U/L (ref 14–54)
AST: 48 U/L — ABNORMAL HIGH (ref 15–41)
Albumin: 4.3 g/dL (ref 3.5–5.0)
Anion gap: 12 (ref 5–15)
BILIRUBIN TOTAL: 0.6 mg/dL (ref 0.3–1.2)
BUN: 8 mg/dL (ref 6–20)
CALCIUM: 9.6 mg/dL (ref 8.9–10.3)
CO2: 25 mmol/L (ref 22–32)
CREATININE: 0.8 mg/dL (ref 0.44–1.00)
Chloride: 103 mmol/L (ref 101–111)
Glucose, Bld: 106 mg/dL — ABNORMAL HIGH (ref 65–99)
Potassium: 3.5 mmol/L (ref 3.5–5.1)
Sodium: 140 mmol/L (ref 135–145)
Total Protein: 7.4 g/dL (ref 6.5–8.1)

## 2015-10-21 LAB — I-STAT TROPONIN, ED
TROPONIN I, POC: 0 ng/mL (ref 0.00–0.08)
TROPONIN I, POC: 0 ng/mL (ref 0.00–0.08)

## 2015-10-21 LAB — DIFFERENTIAL
Basophils Absolute: 0.1 10*3/uL (ref 0.0–0.1)
Basophils Relative: 1 %
Eosinophils Absolute: 0.3 10*3/uL (ref 0.0–0.7)
Eosinophils Relative: 3 %
LYMPHS ABS: 4.6 10*3/uL — AB (ref 0.7–4.0)
LYMPHS PCT: 45 %
MONO ABS: 0.8 10*3/uL (ref 0.1–1.0)
MONOS PCT: 8 %
NEUTROS ABS: 4.5 10*3/uL (ref 1.7–7.7)
Neutrophils Relative %: 43 %

## 2015-10-21 LAB — I-STAT CHEM 8, ED
BUN: 9 mg/dL (ref 6–20)
CREATININE: 0.8 mg/dL (ref 0.44–1.00)
Calcium, Ion: 1.17 mmol/L (ref 1.13–1.30)
Chloride: 103 mmol/L (ref 101–111)
GLUCOSE: 103 mg/dL — AB (ref 65–99)
HCT: 48 % — ABNORMAL HIGH (ref 36.0–46.0)
HEMOGLOBIN: 16.3 g/dL — AB (ref 12.0–15.0)
Potassium: 3.4 mmol/L — ABNORMAL LOW (ref 3.5–5.1)
Sodium: 142 mmol/L (ref 135–145)
TCO2: 25 mmol/L (ref 0–100)

## 2015-10-21 LAB — CBG MONITORING, ED: Glucose-Capillary: 91 mg/dL (ref 65–99)

## 2015-10-21 LAB — PROTIME-INR
INR: 1.02 (ref 0.00–1.49)
Prothrombin Time: 13.6 seconds (ref 11.6–15.2)

## 2015-10-21 LAB — APTT: aPTT: 31 seconds (ref 24–37)

## 2015-10-21 LAB — ETHANOL: Alcohol, Ethyl (B): <5 mg/dL (ref <5)

## 2015-10-21 MED ORDER — LORAZEPAM 2 MG/ML IJ SOLN
1.0000 mg | Freq: Once | INTRAMUSCULAR | Status: AC
  Administered 2015-10-21: 1 mg via INTRAVENOUS
  Filled 2015-10-21: qty 1

## 2015-10-21 NOTE — ED Notes (Signed)
Pt states that when she woke up at 0900 this morning she felt "sinusy," dizzy and was leaning towards the left when she walked. Pt states that when she went to bed last night she felt perfectly normal. Denies all other symptoms,

## 2015-10-21 NOTE — ED Notes (Signed)
Neurology at bedside.

## 2015-10-21 NOTE — Consult Note (Signed)
Neurology Consultation Reason for Consult: Dysequilibrium Referring Physician: Mesner, J  CC: Unsteadiness.   History is obtained from:Patient, family  HPI: Jordan Lane is a 69 y.o. female with a history of hypertension who presents with unsteadiness that started on awakening this morning. She states that anytime she walks she tries to veer to the left. She denies any nausea, double vision, noticeable hand clumsiness, or other symptoms. She has not noticed a significant change over the course of the day, until I walked her for my examination which point she appeared to do significantly better though she was still slightly unsteady.   LKW: 11/20 prior to going to bed, shortly before midnight tpa given?: no, out of window    ROS: A 14 point ROS was performed and is negative except as noted in the HPI.   Past Medical History  Diagnosis Date  . Hypertension   . Fibroadenoma of breast     Right  . Reflux   . GERD (gastroesophageal reflux disease)   . Arthritis   . History of blood transfusion 48 yrs ago     Family History  Problem Relation Age of Onset  . Hypertension Father   . Diabetes Father   . Breast cancer Paternal Aunt     Age 6's  . Breast cancer Paternal Grandmother     Age 84  . Other Mother     Brain tumor     Social History:  reports that she has never smoked. She has never used smokeless tobacco. She reports that she does not drink alcohol or use illicit drugs.   Exam: Current vital signs: BP 142/82 mmHg  Pulse 75  Temp(Src) 97.8 F (36.6 C) (Oral)  Resp 15  Ht 5\' 2"  (1.575 m)  Wt 74.844 kg (165 lb)  BMI 30.17 kg/m2  SpO2 100% Vital signs in last 24 hours: Temp:  [97.8 F (36.6 C)] 97.8 F (36.6 C) (11/21 2011) Pulse Rate:  [72-85] 75 (11/21 2230) Resp:  [12-19] 15 (11/21 2230) BP: (139-158)/(70-92) 142/82 mmHg (11/21 2230) SpO2:  [100 %] 100 % (11/21 2230) Weight:  [74.844 kg (165 lb)] 74.844 kg (165 lb) (11/21 2011)   Physical Exam   Constitutional: Appears well-developed and well-nourished.  Psych: Affect appropriate to situation Eyes: No scleral injection HENT: No OP obstrucion Head: Normocephalic.  Cardiovascular: Normal rate and regular rhythm.  Respiratory: Effort normal and breath sounds normal to anterior ascultation GI: Soft.  No distension. There is no tenderness.  Skin: WDI  Neuro: Mental Status: Patient is awake, alert, oriented to person, place, month, year, and situation. Patient is able to give a clear and coherent history. No signs of aphasia or neglect Cranial Nerves: II: Visual Fields are full. Pupils are equal, round, and reactive to light.  ? Hypometric saccades on leftward gaze III,IV, VI: EOMI without ptosis or diploplia.  V: Facial sensation is symmetric to temperature VII: Facial movement is symmetric.  VIII: hearing is intact to voice X: Uvula elevates symmetrically XI: Shoulder shrug is symmetric. XII: tongue is midline without atrophy or fasciculations.  Motor: Tone is normal. Bulk is normal. 5/5 strength was present in all four extremities.  Sensory: Sensation is symmetric to light touch and temperature in the arms and legs. Deep Tendon Reflexes: 2+ and symmetric in the biceps and patellae.  Plantars: Toes are downgoing bilaterally.  Cerebellar: She has bilaterally symmetric finger-nose-finger and rapid alternating movements that appear intact.? Slight clumsiness on heel-knee-shin on the left though this could be  within the normal variation range.  I have reviewed labs in epic and the results pertinent to this consultation are: CMP-unremarkable  I have reviewed the images obtained: CT head-unremarkable  Impression: 69 year old female with a history of unsteadiness over the course of the day. It is very possible that this represents a subacute stroke but  with reported cerumen impaction and sinus pressure, as well as lack of other confirmatory findings on exam, I would favor  imaging to confirm that this is due to ischemia.  Recommendations: 1) MRI brain without contrast 2) if this is negative and the patient continues to walk safely, then I do not think that further workup or testing is needed unless the symptoms do not continue to improve.  Marland Kitchen 3) if positive:  1. HgbA1c, fasting lipid panel 2. MRI, MRA  of the brain without contrast 3. Frequent neuro checks 4. Echocardiogram 5. Carotid dopplers 6. Prophylactic therapy-Antiplatelet med: Aspirin - dose 325mg  PO or 300mg  PR 7. Risk factor modification 8. Telemetry monitoring 9. PT consult, OT consult, Speech consult    Roland Rack, MD [facility] [phone]  If 7pm- 7am, please page neurology on call as listed in [place].

## 2015-10-21 NOTE — ED Provider Notes (Signed)
CSN: [id number]     Arrival date & time 10/21/15  2002 History   First MD Initiated Contact with Patient 10/21/15 2204     Chief Complaint  Patient presents with  . Dizziness     (Consider location/radiation/quality/duration/timing/severity/associated sxs/prior Treatment) HPI  69 yo F here with difficulty walking (consistently walks to the left and falls, loses balance) since some time last night (woke up with it). No change since it started. No history of same. Nothing makes it better or worse. No other associated symptoms.   Past Medical History  Diagnosis Date  . Hypertension   . Fibroadenoma of breast     Right  . Reflux   . GERD (gastroesophageal reflux disease)   . Arthritis   . History of blood transfusion 48 yrs ago   Past Surgical History  Procedure Laterality Date  . Knee surgery  left 2010, right 2013    Arthroscopic  . Herniated disc  1987    lower back   . Abdominal hysterectomy  1984    TAH  . Cholecystectomy  2009  . Partial knee arthroplasty  06/14/2012    Procedure: UNICOMPARTMENTAL KNEE;  Surgeon: Mauri Pole, MD;  Location: [facility];  Service: Orthopedics;  Laterality: Left;  Left Medial Unicompartmental Knee  . Esophagogastroduodenoscopy  11/14/2012    Procedure: ESOPHAGOGASTRODUODENOSCOPY (EGD);  Surgeon: Wonda Horner, MD;  Location: [facility] ENDOSCOPY;  Service: Endoscopy;  Laterality: N/A;  . Partial knee arthroplasty Right 11/27/2013    Procedure: UNICOMPARTMENTAL RIGHT KNEE, Steroid injection in right great toe;  Surgeon: Mauri Pole, MD;  Location: [facility];  Service: Orthopedics;  Laterality: Right;   Family History  Problem Relation Age of Onset  . Hypertension Father   . Diabetes Father   . Breast cancer Paternal Aunt     Age 32's  . Breast cancer Paternal Grandmother     Age 63  . Other Mother     Brain tumor   Social History  Substance Use Topics  . Smoking status: Never Smoker   . Smokeless tobacco: Never Used  . Alcohol Use: No    OB History    Gravida Para Term Preterm AB TAB SAB Ectopic Multiple Living   3 3 3       3      Review of Systems  Constitutional: Negative for fever, activity change and fatigue.  HENT: Negative for congestion and facial swelling.   Eyes: Negative for photophobia, discharge, redness and visual disturbance.  Respiratory: Negative for cough and shortness of breath.   Cardiovascular: Negative for chest pain.  Gastrointestinal: Negative for nausea, vomiting, abdominal pain and abdominal distention.  Endocrine: Negative for polydipsia.  Genitourinary: Negative for dysuria.  Musculoskeletal: Negative for back pain.  Skin: Negative for pallor and wound.  Neurological: Negative for dizziness, seizures, speech difficulty, weakness, light-headedness, numbness and headaches.       Difficulty walking  All other systems reviewed and are negative.     Allergies  Codeine  Home Medications   Prior to Admission medications   Medication Sig Start Date End Date Taking? Authorizing Provider  ALPRAZolam (XANAX) 0.25 MG tablet Take 0.25 mg by mouth at bedtime as needed. 09/13/15  Yes Historical Provider, MD  amLODipine (NORVASC) 5 MG tablet Take 5 mg by mouth daily after breakfast.   Yes Historical Provider, MD  aspirin 81 MG chewable tablet Chew 81 mg by mouth daily.   Yes Historical Provider, MD  Calcium Carbonate-Vitamin D (CALCIUM + D  PO) Take 1 tablet by mouth daily.    Yes Historical Provider, MD  cholecalciferol (VITAMIN D) 1000 UNITS tablet Take 1,000 Units by mouth daily.    Yes Historical Provider, MD  diphenhydrAMINE (BENADRYL) 25 mg capsule Take 25 mg by mouth every 6 (six) hours as needed for allergies.   Yes Historical Provider, MD  docusate sodium 100 MG CAPS Take 100 mg by mouth 2 (two) times daily. 11/28/13   Danae Orleans, PA-C  ferrous sulfate 325 (65 FE) MG tablet Take 1 tablet (325 mg total) by mouth 3 (three) times daily after meals. 11/28/13   Danae Orleans, PA-C   glucosamine-chondroitin 500-400 MG tablet Take 1 tablet by mouth daily.   Yes Historical Provider, MD  HYDROmorphone (DILAUDID) 2 MG tablet Take 1-2 tablets (2-4 mg total) by mouth every 4 (four) hours as needed for severe pain. 11/28/13   Danae Orleans, PA-C  Multiple Vitamin (MULTIVITAMIN) tablet Take 1 tablet by mouth daily.   Yes Historical Provider, MD  naproxen sodium (ANAPROX) 220 MG tablet Take 220 mg by mouth 2 (two) times daily as needed (for pain).   Yes Historical Provider, MD  polyethylene glycol (MIRALAX / GLYCOLAX) packet Take 17 g by mouth 2 (two) times daily. 11/28/13   Danae Orleans, PA-C  promethazine (PHENERGAN) 12.5 MG tablet Take 1-2 tablets (12.5-25 mg total) by mouth every 4 (four) hours as needed for nausea or vomiting. 11/28/13   Danae Orleans, PA-C  ranitidine (ZANTAC) 150 MG tablet Take 150 mg by mouth 2 (two) times daily.    Yes Historical Provider, MD  tiZANidine (ZANAFLEX) 4 MG capsule Take 1 capsule (4 mg total) by mouth 3 (three) times daily as needed for muscle spasms. 11/28/13   Danae Orleans, PA-C  valsartan-hydrochlorothiazide (DIOVAN-HCT) 160-12.5 MG per tablet Take 1 tablet by mouth daily after breakfast.   Yes Historical Provider, MD   BP 158/92 mmHg  Pulse 72  Temp(Src) 97.8 F (36.6 C) (Oral)  Resp 19  Ht 5\' 2"  (1.575 m)  Wt 165 lb (74.844 kg)  BMI 30.17 kg/m2  SpO2 100% Physical Exam  Constitutional: She is oriented to person, place, and time. She appears well-developed and well-nourished.  HENT:  Head: Normocephalic and atraumatic.  Neck: Normal range of motion.  Cardiovascular: Normal rate and regular rhythm.   Pulmonary/Chest: No stridor. No respiratory distress.  Abdominal: She exhibits no distension.  Neurological: She is alert and oriented to person, place, and time.  No altered mental status, able to give full seemingly accurate history.  Face is symmetric, EOM's intact, pupils equal and reactive, vision intact, tongue and uvula  midline without deviation Upper and Lower extremity motor 5/5, intact pain perception in distal extremities, 2+ reflexes in biceps, patella and achilles tendons. Finger to nose with dysmetria in left hand, heel to shin normal. Walks with significant ataxia to the left.  Nursing note and vitals reviewed.   ED Course  Procedures (including critical care time) Labs Review Labs Reviewed  CBC - Abnormal; Notable for the following:    Hemoglobin 15.2 (*)    All other components within normal limits  DIFFERENTIAL - Abnormal; Notable for the following:    Lymphs Abs 4.6 (*)    All other components within normal limits  COMPREHENSIVE METABOLIC PANEL - Abnormal; Notable for the following:    Glucose, Bld 106 (*)    AST 48 (*)    All other components within normal limits  I-STAT CHEM 8, ED - Abnormal; Notable for  the following:    Potassium 3.4 (*)    Glucose, Bld 103 (*)    Hemoglobin 16.3 (*)    HCT 48.0 (*)    All other components within normal limits  PROTIME-INR  APTT  I-STAT TROPOININ, ED  CBG MONITORING, ED    Imaging Review Ct Head Wo Contrast  10/21/2015  CLINICAL DATA:  Acute onset of dizziness and leaning to the left while walking. Initial encounter. EXAM: CT HEAD WITHOUT CONTRAST TECHNIQUE: Contiguous axial images were obtained from the base of the skull through the vertex without intravenous contrast. COMPARISON:  None. FINDINGS: There is no evidence of acute infarction, mass lesion, or intra- or extra-axial hemorrhage on CT. Scattered periventricular and subcortical white matter change likely reflects small vessel ischemic microangiopathy The brainstem and fourth ventricle are within normal limits. The basal ganglia are unremarkable in appearance. The cerebral hemispheres demonstrate grossly normal gray-white differentiation. No mass effect or midline shift is seen. There is no evidence of fracture; visualized osseous structures are unremarkable in appearance. The orbits are  within normal limits. The paranasal sinuses and mastoid air cells are well-aerated. No significant soft tissue abnormalities are seen. IMPRESSION: 1. No acute intracranial pathology seen on CT. 2. Scattered small vessel ischemic microangiopathy. Electronically Signed   By: Garald Balding M.D.   On: 10/21/2015 21:38   I have personally reviewed and evaluated these images and lab results as part of my medical decision-making.   EKG Interpretation   Date/Time:  Monday October 21 2015 20:14:10 EST Ventricular Rate:  76 PR Interval:  160 QRS Duration: 120 QT Interval:  436 QTC Calculation: 490 R Axis:   -9 Text Interpretation:  Sinus rhythm with Fusion complexes Low voltage QRS  Right bundle branch block Abnormal ECG Confirmed by Jaquayla Hege MD, Corene Cornea  [id number]) on 10/21/2015 11:14:17 PM      MDM   Final diagnoses:  Cerebrovascular accident (CVA) due to other mechanism ([place])   S/S c/w stroke within last 24 hours. Unknown onset to TPA not a possibility. D/W neurology who recommends MRI head to eval for stroke. If negative, can dc. If positive, admit to medicine.   Care to Dr. Randal Buba around 0030, awaiting brain MRI.     Merrily Pew, MD 10/24/15 [phone]

## 2015-10-22 ENCOUNTER — Encounter (HOSPITAL_COMMUNITY): Payer: Self-pay | Admitting: Family Medicine

## 2015-10-22 ENCOUNTER — Inpatient Hospital Stay (HOSPITAL_COMMUNITY): Payer: Commercial Managed Care - HMO

## 2015-10-22 ENCOUNTER — Emergency Department (HOSPITAL_COMMUNITY): Payer: Commercial Managed Care - HMO

## 2015-10-22 DIAGNOSIS — Z885 Allergy status to narcotic agent status: Secondary | ICD-10-CM | POA: Diagnosis not present

## 2015-10-22 DIAGNOSIS — K219 Gastro-esophageal reflux disease without esophagitis: Secondary | ICD-10-CM | POA: Diagnosis present

## 2015-10-22 DIAGNOSIS — Z8249 Family history of ischemic heart disease and other diseases of the circulatory system: Secondary | ICD-10-CM | POA: Diagnosis not present

## 2015-10-22 DIAGNOSIS — I6789 Other cerebrovascular disease: Secondary | ICD-10-CM

## 2015-10-22 DIAGNOSIS — I639 Cerebral infarction, unspecified: Secondary | ICD-10-CM

## 2015-10-22 DIAGNOSIS — Z803 Family history of malignant neoplasm of breast: Secondary | ICD-10-CM | POA: Diagnosis not present

## 2015-10-22 DIAGNOSIS — I63212 Cerebral infarction due to unspecified occlusion or stenosis of left vertebral arteries: Secondary | ICD-10-CM | POA: Diagnosis not present

## 2015-10-22 DIAGNOSIS — R260 Ataxic gait: Secondary | ICD-10-CM | POA: Diagnosis present

## 2015-10-22 DIAGNOSIS — E785 Hyperlipidemia, unspecified: Secondary | ICD-10-CM | POA: Diagnosis present

## 2015-10-22 DIAGNOSIS — R29700 NIHSS score 0: Secondary | ICD-10-CM | POA: Diagnosis present

## 2015-10-22 DIAGNOSIS — I63211 Cerebral infarction due to unspecified occlusion or stenosis of right vertebral arteries: Secondary | ICD-10-CM | POA: Diagnosis not present

## 2015-10-22 DIAGNOSIS — I63012 Cerebral infarction due to thrombosis of left vertebral artery: Secondary | ICD-10-CM | POA: Diagnosis present

## 2015-10-22 DIAGNOSIS — I638 Other cerebral infarction: Secondary | ICD-10-CM | POA: Diagnosis present

## 2015-10-22 DIAGNOSIS — E669 Obesity, unspecified: Secondary | ICD-10-CM | POA: Diagnosis present

## 2015-10-22 DIAGNOSIS — Z833 Family history of diabetes mellitus: Secondary | ICD-10-CM | POA: Diagnosis not present

## 2015-10-22 DIAGNOSIS — I672 Cerebral atherosclerosis: Secondary | ICD-10-CM | POA: Diagnosis present

## 2015-10-22 DIAGNOSIS — Z96653 Presence of artificial knee joint, bilateral: Secondary | ICD-10-CM | POA: Diagnosis present

## 2015-10-22 DIAGNOSIS — I10 Essential (primary) hypertension: Secondary | ICD-10-CM

## 2015-10-22 DIAGNOSIS — Z6830 Body mass index (BMI) 30.0-30.9, adult: Secondary | ICD-10-CM | POA: Diagnosis not present

## 2015-10-22 DIAGNOSIS — E876 Hypokalemia: Secondary | ICD-10-CM | POA: Diagnosis present

## 2015-10-22 DIAGNOSIS — Z886 Allergy status to analgesic agent status: Secondary | ICD-10-CM | POA: Diagnosis not present

## 2015-10-22 LAB — LIPID PANEL
CHOLESTEROL: 195 mg/dL (ref 0–200)
HDL: 43 mg/dL (ref >40)
LDL Cholesterol: 114 mg/dL — ABNORMAL HIGH (ref 0–99)
Total CHOL/HDL Ratio: 4.5 RATIO
Triglycerides: 192 mg/dL — ABNORMAL HIGH (ref <150)
VLDL: 38 mg/dL (ref 0–40)

## 2015-10-22 LAB — CBG MONITORING, ED: Glucose-Capillary: 125 mg/dL — ABNORMAL HIGH (ref 65–99)

## 2015-10-22 LAB — GLUCOSE, CAPILLARY
GLUCOSE-CAPILLARY: 229 mg/dL — AB (ref 65–99)
GLUCOSE-CAPILLARY: 101 mg/dL — AB (ref 65–99)

## 2015-10-22 LAB — MAGNESIUM: MAGNESIUM: 1.9 mg/dL (ref 1.7–2.4)

## 2015-10-22 MED ORDER — CLOPIDOGREL BISULFATE 75 MG PO TABS
75.0000 mg | ORAL_TABLET | Freq: Every day | ORAL | Status: DC
  Administered 2015-10-22: 75 mg via ORAL
  Filled 2015-10-22: qty 1

## 2015-10-22 MED ORDER — ALPRAZOLAM 0.25 MG PO TABS: 0.2500 mg | ORAL_TABLET | Freq: Every evening | ORAL | Status: DC | PRN

## 2015-10-22 MED ORDER — VALSARTAN-HYDROCHLOROTHIAZIDE 160-12.5 MG PO TABS: 1.0000 | ORAL_TABLET | Freq: Every day | ORAL | Status: DC

## 2015-10-22 MED ORDER — ACETAMINOPHEN 325 MG PO TABS
650.0000 mg | ORAL_TABLET | ORAL | Status: DC | PRN
  Administered 2015-10-22: 650 mg via ORAL
  Filled 2015-10-22: qty 2

## 2015-10-22 MED ORDER — POTASSIUM CHLORIDE CRYS ER 20 MEQ PO TBCR
20.0000 meq | EXTENDED_RELEASE_TABLET | Freq: Once | ORAL | Status: AC
Start: 2015-10-22 — End: 2015-10-22
  Administered 2015-10-22: 20 meq via ORAL
  Filled 2015-10-22: qty 1

## 2015-10-22 MED ORDER — ASPIRIN 325 MG PO TABS
325.0000 mg | ORAL_TABLET | Freq: Every day | ORAL | Status: DC
Start: 2015-10-22 — End: 2015-10-22
  Administered 2015-10-22: 325 mg via ORAL
  Filled 2015-10-22: qty 1

## 2015-10-22 MED ORDER — IOHEXOL 350 MG/ML SOLN
50.0000 mL | Freq: Once | INTRAVENOUS | Status: AC | PRN
  Administered 2015-10-22: 50 mL via INTRAVENOUS

## 2015-10-22 MED ORDER — AMLODIPINE BESYLATE 5 MG PO TABS: 5.0000 mg | ORAL_TABLET | Freq: Every day | ORAL | Status: DC

## 2015-10-22 MED ORDER — ENOXAPARIN SODIUM 40 MG/0.4ML ~~LOC~~ SOLN
40.0000 mg | SUBCUTANEOUS | Status: DC
  Administered 2015-10-22: 40 mg via SUBCUTANEOUS
  Filled 2015-10-22: qty 0.4

## 2015-10-22 MED ORDER — FAMOTIDINE 20 MG PO TABS: 20.0000 mg | ORAL_TABLET | Freq: Every day | ORAL | Status: DC

## 2015-10-22 MED ORDER — CLOPIDOGREL BISULFATE 75 MG PO TABS: 75.0000 mg | ORAL_TABLET | Freq: Every day | ORAL | Status: DC

## 2015-10-22 MED ORDER — ATORVASTATIN CALCIUM 20 MG PO TABS: 20.0000 mg | ORAL_TABLET | Freq: Every day | ORAL | Status: AC

## 2015-10-22 MED ORDER — SALINE SPRAY 0.65 % NA SOLN
1.0000 | NASAL | Status: DC | PRN
  Administered 2015-10-22: 1 via NASAL
  Filled 2015-10-22: qty 44

## 2015-10-22 MED ORDER — STROKE: EARLY STAGES OF RECOVERY BOOK
Freq: Once | Status: AC
  Administered 2015-10-22: 04:00:00
  Filled 2015-10-22 (×2): qty 1

## 2015-10-22 MED ORDER — ATORVASTATIN CALCIUM 10 MG PO TABS
20.0000 mg | ORAL_TABLET | Freq: Every day | ORAL | Status: DC
  Administered 2015-10-22: 20 mg via ORAL
  Filled 2015-10-22: qty 2

## 2015-10-22 NOTE — H&P (Signed)
History and Physical  Patient Name: Jordan Lane     [id number]    DOB: Apr 28, 1946    DOA: 10/21/2015 Referring physician: Merrily Pew, MD PCP: Irven Shelling, MD      Chief Complaint: Dizziness  HPI: Jordan Lane is a 69 y.o. female with a past medical history significant for HTN who presents with acute gait ataxia.  The patient was in her usual state of health until this morning at 9 AM when she woke up feeling "dizzy". She went into the bathroom, felt that she was veering to the left, and then fell into the bathtub because she was so unbalanced. Her symptoms persisted all day and so she presented to the emergency room.  In the ED, the patient had ataxia and dysmetria on initial exam reportedly, although these were improving.  A noncontrasted head CT was unremarkable but an MRI of the brain showed a small subcentimeter left medullary infarct. TRH were asked to admit for stroke.     Review of Systems:  Pt complains of dizziness, left maxillary sinus pressure, abnormal gait. Pt denies any focal weakness, numbness, passing out, seizure, dysarthria.  All other systems negative except as just noted or noted in the history of present illness.  Allergies  Allergen Reactions  . Codeine Nausea And Vomiting    Prior to Admission medications   Medication Sig Start Date End Date Taking? Authorizing Provider  ALPRAZolam (XANAX) 0.25 MG tablet Take 0.25 mg by mouth at bedtime as needed. 09/13/15  Yes Historical Provider, MD  amLODipine (NORVASC) 5 MG tablet Take 5 mg by mouth daily after breakfast.   Yes Historical Provider, MD  aspirin 81 MG chewable tablet Chew 81 mg by mouth daily.   Yes Historical Provider, MD  Calcium Carbonate-Vitamin D (CALCIUM + D PO) Take 1 tablet by mouth daily.    Yes Historical Provider, MD  cholecalciferol (VITAMIN D) 1000 UNITS tablet Take 1,000 Units by mouth daily.    Yes Historical Provider, MD  diphenhydrAMINE (BENADRYL) 25 mg capsule  Take 25 mg by mouth every 6 (six) hours as needed for allergies.   Yes Historical Provider, MD  glucosamine-chondroitin 500-400 MG tablet Take 1 tablet by mouth daily.   Yes Historical Provider, MD  Multiple Vitamin (MULTIVITAMIN) tablet Take 1 tablet by mouth daily.   Yes Historical Provider, MD  naproxen sodium (ANAPROX) 220 MG tablet Take 220 mg by mouth 2 (two) times daily as needed (for pain).   Yes Historical Provider, MD  ranitidine (ZANTAC) 150 MG tablet Take 150 mg by mouth 2 (two) times daily.    Yes Historical Provider, MD  valsartan-hydrochlorothiazide (DIOVAN-HCT) 160-12.5 MG per tablet Take 1 tablet by mouth daily after breakfast.   Yes Historical Provider, MD    Past Medical History  Diagnosis Date  . Hypertension   . Fibroadenoma of breast     Right  . Reflux   . GERD (gastroesophageal reflux disease)   . Arthritis   . History of blood transfusion 48 yrs ago    Past Surgical History  Procedure Laterality Date  . Knee surgery  left 2010, right 2013    Arthroscopic  . Herniated disc  1987    lower back   . Abdominal hysterectomy  1984    TAH  . Cholecystectomy  2009  . Partial knee arthroplasty  06/14/2012    Procedure: UNICOMPARTMENTAL KNEE;  Surgeon: Mauri Pole, MD;  Location: [facility];  Service: Orthopedics;  Laterality: Left;  Left  Medial Unicompartmental Knee  . Esophagogastroduodenoscopy  11/14/2012    Procedure: ESOPHAGOGASTRODUODENOSCOPY (EGD);  Surgeon: Wonda Horner, MD;  Location[facility] ENDOSCOPY;  Service: Endoscopy;  Laterality: N/A;  . Partial knee arthroplasty Right 11/27/2013    Procedure: UNICOMPARTMENTAL RIGHT KNEE, Steroid injection in right great toe;  Surgeon: Mauri Pole, MD;  Location: [facility];  Service: Orthopedics;  Laterality: Right;    Family history: family history includes Breast cancer in her paternal aunt and paternal grandmother; Cancer - Lung in her father; Diabetes in her father; Hypertension in her father; Other in her  mother.  Social History: Patient lives with her husband. She drives and is independent with all ADLs. She is a never smoker. She does not drink.       Physical Exam: BP 100/69 mmHg  Pulse 84  Temp(Src) 97.8 F (36.6 C) (Oral)  Resp 22  Ht 5\' 2"  (1.575 m)  Wt 74.844 kg (165 lb)  BMI 30.17 kg/m2  SpO2 96% General appearance: Well-developed, adult female, alert and in no acute distress.   Eyes: Anicteric, conjunctiva pink, lids and lashes normal.     ENT: No nasal deformity, discharge, or epistaxis.  OP moist without lesions.   Skin: Warm and dry.  No jaundice.  No suspicious rashes or lesions. Cardiac: RRR, nl S1-S2, no murmurs appreciated.  Capillary refill is brisk.  No carotid bruits. No LE edema.  Radial pulses 2+ and symmetric. Respiratory: Normal respiratory rate and rhythm.  CTAB without rales or wheezes. Abdomen: Abdomen soft without rigidity.  No TTP. No ascites, distension.   MSK: No deformities or effusions. Neuro: Pupils are 4 mm and reactive to 3 mm. Extraocular movements are intact, without nystagmus. Cranial nerve 5 is within normal limits. Cranial nerve 7 is symmetrical. Cranial nerve 8 is within normal limits. Cranial nerves 9 and 10 reveal equal palate elevation. Cranial nerve 11 reveals sternocleidomastoid strong. Cranial nerve 12 is midline. I do not note a deficit in motor strength testing in the upper and lower extremities bilaterally with normal motor, tone and bulk. Romberg maneuver is negative for pathology. Finger-to-nose testing is within normal limits. The patient is oriented to time, place and person. Speech is fluent. Naming is grossly intact. Recall, recent and remote, as well as general fund of knowledge seem within normal limits. Attention span and concentration are within normal limits.   Psych: Behavior appropriate.  Affect tired.  No evidence of aural or visual hallucinations or delusions.       Labs on Admission:  The metabolic panel  shows mild hypokalemia. The AST is just above the upper limit of normal. The troponin is negative. INR is normal. The complete blood count shows no leukocytosis or thrombocytopenia.   Radiological Exams on Admission: CT head without contrast 10/22/2015  Unremarkable   Mr Brain Wo Contrast 10/22/2015 IMPRESSION: 1. 7 mm focus of restricted diffusion within the lateral left medulla, suspicious for a small acute ischemic infarct. 2. Cerebral atrophy with moderate chronic small vessel ischemic disease and multiple small remote lacunar infarcts as above.    EKG: Independently reviewed. New right bundle blanch block, sinus rhythm.    Assessment/Plan   1. Acute Stroke:  This is new.   -Admit to telemetry -Neuro checks, NIHSS per protocol -Daily aspirin 325 mg -Permissive hypertension for now -Lipids, hemoglobin A1c -CT angiography of head and neck, ordered -Echocardiogram ordered -PT/OT/SLP consultation -Consult to Neurology, appreciate recommendations  2. Hypokalemia:  This is new.   -Supplemented  3.  HTN:  -Permissive hypertension for now, hold valsartan, HCTZ and amlodipine       DVT PPx: Lovenox Diet: Heart healthy after swallow screen Consultants: Neurology Code Status: Full Family Communication: The diagnosis and expected plan of care were dsicussed with the family at the bedside.  All questions were answered.  Code status was discussed.  Medical decision making: Patient seen 2:47 AM on 10/22/2015.  What exists of the patient's electronic chart were reviewed and the case was discussed with Dr. Dayna Barker and Dr. Leonel Ramsay.   Disposition Plan:  Will admit for stroke.  Stroke work up as above and consult to ancillary services.  Expect discharge within 2-3 days.    Edwin Dada [facility] Pager [phone]

## 2015-10-22 NOTE — Evaluation (Signed)
Physical Therapy Evaluation Patient Details Name: Jordan Lane MRN: [id number] DOB: 1945-12-27 Today's Date: 10/22/2015   History of Present Illness  Adm with dizziness and gait instability MRI small left medullary infarct  PMHx- bil TKR, breast Ca  Clinical Impression  Patient evaluated by Physical Therapy with no further acute PT needs identified. All education for safe discharge home has been completed and the patient/husband has no further questions. Patient has continued decr coordination RLE and imbalance with drifting to her left when walking. Family present and assure pt will not be alone over the next several days. She has a RW (if needed to incr independence). See below for any follow-up Physical Therapy or equipment needs. PT is signing off. Thank you for this referral.     Follow Up Recommendations Home health PT;  Supervision for mobility/OOB    Equipment Recommendations  None recommended by PT    Recommendations for Other Services       Precautions / Restrictions Precautions Precautions: Fall Restrictions Weight Bearing Restrictions: No      Mobility  Bed Mobility Overal bed mobility: Independent                Transfers Overall transfer level: Needs assistance Equipment used: None Transfers: Sit to/from Stand Sit to Stand: Min guard         General transfer comment: reports some light dizziness on initial standing; close-guarding for safety  Ambulation/Gait Ambulation/Gait assistance: Min guard Ambulation Distance (Feet): 250 Feet Assistive device: None Gait Pattern/deviations: Step-through pattern;Decreased stance time - right;Decreased step length - left;Antalgic;Drifts right/left Gait velocity: decr, could incr with vc Gait velocity interpretation: at or above normal speed for age/gender General Gait Details: initially continued to drift to her left; pt self-selected to walk with wall close to her left "in case I need it"; progressed to  no drift by end of the walk;  Stairs Stairs: Yes Stairs assistance: Min guard Stair Management: One rail Left;Alternating pattern;Step to pattern;Forwards Number of Stairs: 10 General stair comments: ascend-initial step to and progressed to alternating; descend- step to; pt required no cues for sequencing (familiar from TKR)  Wheelchair Mobility    Modified Rankin (Stroke Patients Only) Modified Rankin (Stroke Patients Only) Pre-Morbid Rankin Score: No significant disability Modified Rankin: Moderately severe disability     Balance Overall balance assessment: Needs assistance Sitting-balance support: No upper extremity supported;Feet unsupported Sitting balance-Leahy Scale: Normal Sitting balance - Comments: adjusting her socks in sitting   Standing balance support: No upper extremity supported Standing balance-Leahy Scale: Good                   Standardized Balance Assessment Standardized Balance Assessment : Dynamic Gait Index   Dynamic Gait Index Level Surface: Moderate Impairment Change in Gait Speed: Mild Impairment Gait with Horizontal Head Turns: Mild Impairment Gait with Vertical Head Turns: Mild Impairment Gait and Pivot Turn: Normal Step Over Obstacle: Mild Impairment Step Around Obstacles: Mild Impairment Steps: Moderate Impairment Total Score: 15       Pertinent Vitals/Pain Pain Assessment: No/denies pain    Home Living Family/patient expects to be discharged to:: Private residence Living Arrangements: Spouse/significant other Available Help at Discharge: Family;Available 24 hours/day Type of Home: House Home Access: Stairs to enter Entrance Stairs-Rails: Right;Left;Can reach both Entrance Stairs-Number of Steps: 3 Home Layout: Two level;Able to live on main level with bedroom/bathroom Home Equipment: Gilford Rile - 2 wheels;Bedside commode (previously used BSC over toilet)      Prior Function Level of  Independence: Independent          Comments: used no device; pt/husband report she has had a slight limp since her last knee surgery     Hand Dominance   Dominant Hand: Right    Extremity/Trunk Assessment   Upper Extremity Assessment: Overall WFL for tasks assessed (basic mobility)           Lower Extremity Assessment: Overall WFL for tasks assessed (quad 4/5, DF 5/5)      Cervical / Trunk Assessment: Normal  Communication   Communication: No difficulties  Cognition Arousal/Alertness: Awake/alert Behavior During Therapy: WFL for tasks assessed/performed Overall Cognitive Status: Within Functional Limits for tasks assessed                      General Comments General comments (skin integrity, edema, etc.): spouse and daughters present    Exercises        Assessment/Plan    PT Assessment All further PT needs can be met in the next venue of care  PT Diagnosis Abnormality of gait   PT Problem List Decreased strength;Decreased balance;Decreased coordination  PT Treatment Interventions     PT Goals (Current goals can be found in the Care Plan section) Acute Rehab PT Goals Patient Stated Goal: go home and work on my balance PT Goal Formulation: All assessment and education complete, DC therapy    Frequency     Barriers to discharge        Co-evaluation               End of Session Equipment Utilized During Treatment: Gait belt Activity Tolerance: Patient tolerated treatment well Patient left: in bed;with family/visitor present (bed alarm not functioning) Nurse Communication: Mobility status         Time: 4967-5916 PT Time Calculation (min) (ACUTE ONLY): 17 min   Charges:   PT Evaluation $Initial PT Evaluation Tier I: 1 Procedure     PT G Codes:        Shellsea Borunda 11-04-2015, 1:42 PM Pager [phone]

## 2015-10-22 NOTE — Progress Notes (Signed)
STROKE TEAM PROGRESS NOTE   HISTORY Jordan Lane is a 69 y.o. female with a history of hypertension who presents with unsteadiness that started on awakening this morning. She states that anytime she walks she tries to veer to the left. She denies any nausea, double vision, noticeable hand clumsiness, or other symptoms. She has not noticed a significant change over the course of the day, until I walked her for my examination which point she appeared to do significantly better though she was still slightly unsteady. She was LKW 11/20 prior to going to bed, shortly before midnight. Patient was not administered TPA secondary to delay in arrival. She was admitted for further evaluation and treatment.   SUBJECTIVE (INTERVAL HISTORY) Her family is at the bedside.  Overall she feels her condition is stable. She still has some unsteady gait. waiting to work with PT.   OBJECTIVE Temp:  [97.8 F (36.6 C)] 97.8 F (36.6 C) (11/22 0340) Pulse Rate:  [70-85] 74 (11/22 1000) Cardiac Rhythm:  [-] Normal sinus rhythm (11/22 0703) Resp:  [10-22] 20 (11/22 1000) BP: (100-158)/(52-92) 129/88 mmHg (11/22 1000) SpO2:  [93 %-100 %] 93 % (11/22 1000) Weight:  [74.844 kg (165 lb)] 74.844 kg (165 lb) (11/21 2011)  CBC:  Recent Labs Lab 10/21/15 2023  WBC 10.3  NEUTROABS 4.5  HGB 15.2*  16.3*  HCT 43.1  48.0*  MCV 90.2  PLT [id number]    Basic Metabolic Panel:  Recent Labs Lab 10/21/15 2010 10/21/15 2023  NA  --  140  142  K  --  3.5  3.4*  CL  --  103  103  CO2  --  25  GLUCOSE  --  106*  103*  BUN  --  8  9  CREATININE  --  0.80  0.80  CALCIUM  --  9.6  MG 1.9  --     Lipid Panel:    Component Value Date/Time   CHOL 195 10/22/2015 0606   TRIG 192* 10/22/2015 0606   HDL 43 10/22/2015 0606   CHOLHDL 4.5 10/22/2015 0606   VLDL 38 10/22/2015 0606   LDLCALC 114* 10/22/2015 0606   HgbA1c: No results found for: HGBA1C Urine Drug Screen: No results found for: LABOPIA, COCAINSCRNUR,  LABBENZ, AMPHETMU, THCU, LABBARB    IMAGING I have personally reviewed the radiological images below and agree with the radiology interpretations.  Ct Head Wo Contrast 10/22/2015  MR detected subtle acute left lateral medullary infarct not appreciated by CT. Prominent small vessel disease type changes. Remote small infarcts coronal radiata/ centrum semiovale bilaterally and tiny remote right thalamic infarct. 10/21/2015   1. No acute intracranial pathology seen on CT. 2. Scattered small vessel ischemic microangiopathy.   CTA HEAD 10/22/2015   Left vertebral artery is dominant. Focal plaque distal left vertebral artery with mild to moderate narrowing. This is proximal to the takeoff of the left posterior inferior cerebellar artery. Mild narrowing and irregularity of the posterior inferior cerebellar artery bilaterally. Very mild narrowing distal right vertebral artery. No significant stenosis of the basilar artery. Mild to moderate narrowing P2 segment left posterior cerebral artery. Mild to moderate narrowing right posterior cerebral artery proximal and distal branch vessels. Anterior circulation without medium or large size vessel significant stenosis or occlusion.  CTA NECK 10/22/2015   No evidence the hemodynamically significant stenosis involving either carotid bifurcation. Carotid arteries are ectatic. Mild narrowing proximal right vertebral artery. Very mild narrowing proximal left vertebral artery. Ectasia of the proximal left  vertebral artery with fold and mild narrowing.   Mr Brain Wo Contrast 10/22/2015  1. 7 mm focus of restricted diffusion within the lateral left medulla, suspicious for a small acute ischemic infarct. 2. Cerebral atrophy with moderate chronic small vessel ischemic disease and multiple small remote lacunar infarcts.  2D Echocardiogram  - Left ventricle: The cavity size was normal. Wall thickness wasincreased in a pattern of mild LVH. Systolic function was normal.The  estimated ejection fraction was in the range of 55% to 60%.Wall motion was normal; there were no regional wall motionabnormalities. Doppler parameters are consistent with abnormalleft ventricular relaxation (grade 1 diastolic dysfunction). - Left atrium: The atrium was mildly dilated.   PHYSICAL EXAM  Temp:  [97.8 F (36.6 C)] 97.8 F (36.6 C) (11/22 0340) Pulse Rate:  [70-85] 74 (11/22 1000) Resp:  [10-22] 20 (11/22 1000) BP: (100-158)/(52-92) 129/88 mmHg (11/22 1000) SpO2:  [93 %-100 %] 93 % (11/22 1000) Weight:  [165 lb (74.844 kg)] 165 lb (74.844 kg) (11/21 2011)  General - Well nourished, well developed, in no apparent distress.  Ophthalmologic - Sharp disc margins OU.   Cardiovascular - Regular rate and rhythm with no murmur.  Mental Status -  Level of arousal and orientation to time, place, and person were intact. Language including expression, naming, repetition, comprehension was assessed and found intact. Attention span and concentration were normal. Recent and remote memory were intact. Fund of Knowledge was assessed and was intact.  Cranial Nerves II - XII - II - Visual field intact OU. III, IV, VI - Extraocular movements intact. V - Facial sensation intact bilaterally. VII - Facial movement intact bilaterally. VIII - Hearing & vestibular intact bilaterally. X - Palate elevates symmetrically. XI - Chin turning & shoulder shrug intact bilaterally. XII - Tongue protrusion intact.  Motor Strength - The patient's strength was normal in all extremities and pronator drift was absent.  Bulk was normal and fasciculations were absent.   Motor Tone - Muscle tone was assessed at the neck and appendages and was normal.  Reflexes - The patient's reflexes were 1+ in all extremities and she had no pathological reflexes.  Sensory - Light touch, temperature/pinprick, vibration and proprioception, and Romberg testing were assessed and were symmetrical.    Coordination - The  patient had normal movements in the hands and feet with no ataxia or dysmetria.  Tremor was absent.  Gait and Station - on walking, pt did demonstrated veering to the left, but no ataxic gait.   ASSESSMENT/PLAN Ms. DARTHULA HELFAND is a 69 y.o. female with history of hypertension and GERD presenting with unsteady gait. She did not receive IV t-PA due to delay in arrival.   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  Resultant  Veering to left on walking  MRI  Small L lateral medullary infarct  CTA head and neck - mild left VA atherosclerosis  2D Echo  No source of embolus   LDL 114  HgbA1c pending  Lovenox 40 mg sq daily for VTE prophylaxis  Diet Heart Room service appropriate?: Yes; Fluid consistency:: Thin  aspirin 81 mg daily prior to admission, changed to clopidogrel 75 mg daily  Patient counseled to be compliant with her antithrombotic medications  Ongoing aggressive stroke risk factor management  Therapy recommendations:  pending   Disposition:  pending   Anticipate ok for discharge once workup complete  Follow up dr. Erlinda Hong in 2 months, stroke clinic (order written)  Hypertension  Stable without BP meds  Home meds amlodipine, valsartan and HCTZ Permissive hypertension (OK if < 220/120) but gradually normalize in 5-7 days Follow up with PCP for BP management, BP goal normotensive  Hyperlipidemia  Home meds:  No statin  LDL 114, goal < 70  Added statin, lipitor 20  Continue statin at discharge  Other Stroke Risk Factors  Advanced age  Obesity, Body mass index is 30.17 kg/(m^2).   Other Active Problems  GERD  Hospital day # 0  Neurology will sign off. Please call with questions. Pt will follow up with Dr. Erlinda Hong at [facility] in about 2 months. Thanks for the consult.  Rosalin Hawking, MD PhD Stroke Neurology 10/22/2015 1:29 PM    To contact Stroke Continuity provider, please refer to [email or web address] After hours,  contact General Neurology

## 2015-10-22 NOTE — Progress Notes (Signed)
Echocardiogram 2D Echocardiogram has been performed.  Tresa Res 10/22/2015, 10:35 AM

## 2015-10-22 NOTE — Discharge Summary (Signed)
[facility]  Physician Discharge Summary   Patient ID: Jordan Lane MRN: [id number] DOB/AGE: 1946/08/01 69 y.o.  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 Home health has been ordered for physical therapy 2. Patient to follow-up with her primary care physician in one week  DISCHARGE CONDITION: fair  Diet recommendation: Heart healthy  Filed Weights   10/21/15 2011  Weight: 74.844 kg (165 lb)    INITIAL HISTORY: 69 year old Caucasian female with a past medical history of hypertension, presented with ataxia. She was leaning towards the left.  Consultations:  Neurology  Procedures: Echocardiogram Study Conclusions - Left ventricle: The cavity size was normal. Wall thickness wasincreased in a pattern of mild LVH. Systolic function was normal.The estimated ejection fraction was in the range of 55% to 60%.Wall motion was normal; there were no regional wall motionabnormalities. Doppler parameters are consistent with abnormalleft ventricular relaxation (grade 1 diastolic dysfunction). - Left atrium: The atrium was mildly dilated.  HOSPITAL COURSE:   Acute stroke A small stroke was identified in the left medulla. Unclear if this explains her presenting symptoms. She appears to be back to baseline. She was seen by neurology. She underwent CT angiogram of the head and neck. Recommendation is to change her from aspirin to Plavix. Her LDL is noted to be elevated. She'll be started on a statin as well. Echocardiogram as above. Seen by physical therapy. Home health ordered as recommended.  Her other medical issues are all stable. She may resume her  antihypertensive medications after 2 days.  Patient very keen on going home today. Her workup has been completed. Okay for discharge.  PERTINENT LABS:  The results of significant diagnostics from this hospitalization (including imaging, microbiology, ancillary and laboratory) are listed below for reference.     Labs: Basic Metabolic Panel:  Recent Labs Lab 10/21/15 2010 10/21/15 2023  NA  --  140  142  K  --  3.5  3.4*  CL  --  103  103  CO2  --  25  GLUCOSE  --  106*  103*  BUN  --  8  9  CREATININE  --  0.80  0.80  CALCIUM  --  9.6  MG 1.9  --    Liver Function Tests:  Recent Labs Lab 10/21/15 2023  AST 48*  ALT 36  ALKPHOS 63  BILITOT 0.6  PROT 7.4  ALBUMIN 4.3   CBC:  Recent Labs Lab 10/21/15 2023  WBC 10.3  NEUTROABS 4.5  HGB 15.2*  16.3*  HCT 43.1  48.0*  MCV 90.2  PLT 282   CBG:  Recent Labs Lab 10/21/15 2017 10/22/15 0003 10/22/15 0807 10/22/15 1108  GLUCAP 91 125* 229* 101*     IMAGING STUDIES Ct Angio Head W/cm &/or Wo Cm  10/22/2015  CLINICAL DATA:  69 year old hypertensive female with dizziness and unsteadiness. Left lateral medulla acute infarct. Subsequent encounter. EXAM: CT ANGIOGRAPHY HEAD AND NECK TECHNIQUE: Multidetector CT imaging of the head and neck was performed using the standard protocol during bolus administration of intravenous contrast. Multiplanar CT image reconstructions and MIPs were obtained to evaluate the vascular anatomy. Carotid stenosis measurements (when applicable) are obtained utilizing NASCET criteria, using the distal internal carotid diameter as  the denominator. CONTRAST:  5mL OMNIPAQUE IOHEXOL 350 MG/ML SOLN COMPARISON:  10/22/2015 brain MR. 10/21/2015 head CT. FINDINGS: CT HEAD Brain: MR detected subtle acute left lateral medullary infarct not appreciated by CT. Prominent small vessel disease type changes. Remote small infarcts coronal radiata/ centrum semiovale bilaterally and tiny remote right  thalamic infarct. No intracranial hemorrhage. No hydrocephalus. No intracranial mass or abnormal enhancement. Calvarium and skull base: Negative. Paranasal sinuses: Mild mucosal thickening inferior right maxillary sinus. Orbits: Negative. CTA NECK Aortic arch: Mild atherosclerotic type changes with minimal bulge left lateral aspect of the arch. Common origin innominate and left common carotid artery. Right carotid system: Mild ectasia without significant stenosis. Left carotid system: Ectasia most notable proximal left common carotid artery with prominent fold. Slight narrowing at the level the fold. Left carotid bifurcation without significant narrowing. Vertebral arteries:Mild narrowing proximal right vertebral artery. Very mild narrowing proximal left vertebral artery. Ectasia of the proximal left vertebral artery with fold and mild narrowing. 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 Other neck: No worrisome neck mass or lung apical mass. CTA HEAD Anterior circulation: Mild plaque cavernous segment internal carotid artery bilaterally with very mild narrowing bilaterally. Anterior circulation without medium or large size vessel significant stenosis or occlusion. Middle cerebral artery and A2 segment anterior cerebral artery mild branch vessel irregularity bilaterally. Posterior circulation: Left vertebral artery is dominant. Focal plaque distal left vertebral artery with mild to moderate narrowing. This is proximal to the takeoff of the left posterior inferior cerebellar artery. Mild narrowing and irregularity of the posterior inferior cerebellar artery bilaterally. Very mild narrowing and irregularity distal right vertebral artery. No significant stenosis of the basilar artery. Mild to moderate narrowing P2 segment left posterior cerebral artery. Mild to moderate narrowing right posterior cerebral artery proximal and distal branch  vessels. Venous sinuses: Negative. Anatomic variants: Negative. Delayed phase: Negative. IMPRESSION: CTA HEAD Left vertebral artery is dominant. Focal plaque distal left vertebral artery with mild to moderate narrowing. This is proximal to the takeoff of the left posterior inferior cerebellar artery. Mild narrowing and irregularity of the posterior inferior cerebellar artery bilaterally. Very mild narrowing distal right vertebral artery. No significant stenosis of the basilar artery. Mild to moderate narrowing P2 segment left posterior cerebral artery. Mild to moderate narrowing right posterior cerebral artery proximal and distal branch vessels. Anterior circulation without medium or large size vessel significant stenosis or occlusion. CTA NECK No evidence the hemodynamically significant stenosis involving either carotid bifurcation. Carotid arteries are ectatic. Mild narrowing proximal right vertebral artery. Very mild narrowing proximal left vertebral artery. Ectasia of the proximal left vertebral artery with fold and mild narrowing. CT HEAD MR detected subtle acute left lateral medullary infarct not appreciated by CT. Prominent small vessel disease type changes. Remote small infarcts coronal radiata/ centrum semiovale bilaterally and tiny remote right thalamic infarct. Electronically Signed   By: Genia Del M.D.   On: 10/22/2015 07:31   Ct Head Wo Contrast  10/21/2015  CLINICAL DATA:  Acute onset of dizziness and leaning to the left while walking. Initial encounter. EXAM: CT HEAD WITHOUT CONTRAST TECHNIQUE: Contiguous axial images were obtained from the base of the skull through the vertex without intravenous contrast. COMPARISON:  None. FINDINGS: There is no evidence of acute infarction, mass lesion, or intra- or extra-axial hemorrhage on CT. Scattered periventricular and subcortical white matter change likely reflects small vessel ischemic microangiopathy The brainstem and fourth ventricle are within  normal limits.  The basal ganglia are unremarkable in appearance. The cerebral hemispheres demonstrate grossly normal gray-white differentiation. No mass effect or midline shift is seen. There is no evidence of fracture; visualized osseous structures are unremarkable in appearance. The orbits are within normal limits. The paranasal sinuses and mastoid air cells are well-aerated. No significant soft tissue abnormalities are seen. IMPRESSION: 1. No acute intracranial pathology seen on CT. 2. Scattered small vessel ischemic microangiopathy. Electronically Signed   By: Garald Balding M.D.   On: 10/21/2015 21:38   Ct Angio Neck W/cm &/or Wo/cm  10/22/2015  CLINICAL DATA:  69 year old hypertensive female with dizziness and unsteadiness. Left lateral medulla acute infarct. Subsequent encounter. EXAM: CT ANGIOGRAPHY HEAD AND NECK TECHNIQUE: Multidetector CT imaging of the head and neck was performed using the standard protocol during bolus administration of intravenous contrast. Multiplanar CT image reconstructions and MIPs were obtained to evaluate the vascular anatomy. Carotid stenosis measurements (when applicable) are obtained utilizing NASCET criteria, using the distal internal carotid diameter as the denominator. CONTRAST:  46mL OMNIPAQUE IOHEXOL 350 MG/ML SOLN COMPARISON:  10/22/2015 brain MR. 10/21/2015 head CT. FINDINGS: CT HEAD Brain: MR detected subtle acute left lateral medullary infarct not appreciated by CT. Prominent small vessel disease type changes. Remote small infarcts coronal radiata/ centrum semiovale bilaterally and tiny remote right thalamic infarct. No intracranial hemorrhage. No hydrocephalus. No intracranial mass or abnormal enhancement. Calvarium and skull base: Negative. Paranasal sinuses: Mild mucosal thickening inferior right maxillary sinus. Orbits: Negative. CTA NECK Aortic arch: Mild atherosclerotic type changes with minimal bulge left lateral aspect of the arch. Common origin innominate  and left common carotid artery. Right carotid system: Mild ectasia without significant stenosis. Left carotid system: Ectasia most notable proximal left common carotid artery with prominent fold. Slight narrowing at the level the fold. Left carotid bifurcation without significant narrowing. Vertebral arteries:Mild narrowing proximal right vertebral artery. Very mild narrowing proximal left vertebral artery. Ectasia of the proximal left vertebral artery with fold and mild narrowing. 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 Other neck: No worrisome neck mass or lung apical mass. CTA HEAD Anterior circulation: Mild plaque cavernous segment internal carotid artery bilaterally with very mild narrowing bilaterally. Anterior circulation without medium or large size vessel significant stenosis or occlusion. Middle cerebral artery and A2 segment anterior cerebral artery mild branch vessel irregularity bilaterally. Posterior circulation: Left vertebral artery is dominant. Focal plaque distal left vertebral artery with mild to moderate narrowing. This is proximal to the takeoff of the left posterior inferior cerebellar artery. Mild narrowing and irregularity of the posterior inferior cerebellar artery bilaterally. Very mild narrowing and irregularity distal right vertebral artery. No significant stenosis of the basilar artery. Mild to moderate narrowing P2 segment left posterior cerebral artery. Mild to moderate narrowing right posterior cerebral artery proximal and distal branch vessels. Venous sinuses: Negative. Anatomic variants: Negative. Delayed phase: Negative. IMPRESSION: CTA HEAD Left vertebral artery is dominant. Focal plaque distal left vertebral artery with mild to moderate narrowing. This is proximal to the takeoff of the left posterior inferior cerebellar artery. Mild narrowing and irregularity of the posterior inferior cerebellar artery  bilaterally. Very mild narrowing distal right vertebral artery. No significant stenosis of the basilar artery. Mild to moderate narrowing P2 segment left posterior cerebral artery. Mild to moderate narrowing right posterior cerebral artery proximal and distal branch vessels. Anterior circulation without medium or large size vessel significant stenosis or occlusion. CTA NECK No evidence the hemodynamically significant stenosis  involving either carotid bifurcation. Carotid arteries are ectatic. Mild narrowing proximal right vertebral artery. Very mild narrowing proximal left vertebral artery. Ectasia of the proximal left vertebral artery with fold and mild narrowing. CT HEAD MR detected subtle acute left lateral medullary infarct not appreciated by CT. Prominent small vessel disease type changes. Remote small infarcts coronal radiata/ centrum semiovale bilaterally and tiny remote right thalamic infarct. Electronically Signed   By: Genia Del M.D.   On: 10/22/2015 07:31   Mr Brain Wo Contrast  10/22/2015  CLINICAL DATA:  Initial evaluation for acute gait abnormality. EXAM: MRI HEAD WITHOUT CONTRAST TECHNIQUE: Multiplanar, multiecho pulse sequences of the brain and surrounding structures were obtained without intravenous contrast. COMPARISON:  Prior CT from earlier the same day. FINDINGS: Diffuse prominence of the CSF containing spaces is compatible with generalized age-related cerebral atrophy. Patchy and confluent T2/FLAIR hyperintensity within the periventricular and deep white matter both cerebral hemispheres most consistent with chronic small vessel ischemic disease. Similar changes seen within the pons. Small remote lacunar infarct present within the periventricular white matter of the bilateral corona radiata. A small remote lacunar infarct within the right thalamus. There is a tiny 7 mm focus of high signal intensity within the left lateral medulla on DWI sequence, suspicious for a small acute ischemic  infarct (series 4, image 7). There is corresponding signal loss on ADC map (series 400, image 7). This is also seen on coronal DWI sequence (series 8, image 15). No associated hemorrhage or mass effect. No other infarct. Normal intravascular flow voids are maintained. No acute intracranial hemorrhage. Tiny chronic micro hemorrhage with the remote right thalamic infarct. No mass lesion or midline shift. No hydrocephalus. No extra-axial fluid collection. Craniocervical junction normal. Pituitary gland within normal limits. No acute abnormality about the orbits. Mild mucosal thickening within the ethmoidal air cells and maxillary sinuses. No mastoid effusion. Inner ear structures normal. Bone marrow signal intensity within normal limits. Scalp soft tissues unremarkable. IMPRESSION: 1. 7 mm focus of restricted diffusion within the lateral left medulla, suspicious for a small acute ischemic infarct. 2. Cerebral atrophy with moderate chronic small vessel ischemic disease and multiple small remote lacunar infarcts as above. Electronically Signed   By: Jeannine Boga M.D.   On: 10/22/2015 01:55    DISCHARGE EXAMINATION: Filed Vitals:   10/22/15 0340 10/22/15 0600 10/22/15 0800 10/22/15 1000  BP: 148/62 135/52 131/64 129/88  Pulse: 70 74 71 74  Temp: 97.8 F (36.6 C)     TempSrc: Oral     Resp: 20 20 20 20   Height:      Weight:      SpO2: 94% 95% 96% 93%   General appearance: alert, cooperative, appears stated age and no distress Resp: clear to auscultation bilaterally Cardio: regular rate and rhythm, S1, S2 normal, no murmur, click, rub or gallop GI: soft, non-tender; bowel sounds normal; no masses,  no organomegaly Extremities: extremities normal, atraumatic, no cyanosis or edema Neurologic: Alert and oriented 3. Cranial nerves II-12 intact. Modest and equal bilateral upper and lower extremity. No pronator drift.  DISPOSITION: Home with home health  Discharge Instructions    Ambulatory  referral to Neurology    Complete by:  As directed   Dr. Erlinda Hong requests followup in 2 months     Call MD for:  difficulty breathing, headache or visual disturbances    Complete by:  As directed      Call MD for:  extreme fatigue    Complete by:  As  directed      Call MD for:  persistant dizziness or light-headedness    Complete by:  As directed      Call MD for:  persistant nausea and vomiting    Complete by:  As directed      Call MD for:  severe uncontrolled pain    Complete by:  As directed      Call MD for:  temperature >100.4    Complete by:  As directed      Diet - low sodium heart healthy    Complete by:  As directed      Discharge instructions    Complete by:  As directed   Please resume your blood pressure medications after 2 days. Please follow up with the primary care provider in 7-10 days.  You were cared for by a hospitalist during your hospital stay. If you have any questions about your discharge medications or the care you received while you were in the hospital after you are discharged, you can call the unit and asked to speak with the hospitalist on call if the hospitalist that took care of you is not available. Once you are discharged, your primary care physician will handle any further medical issues. Please note that NO REFILLS for any discharge medications will be authorized once you are discharged, as it is imperative that you return to your primary care physician (or establish a relationship with a primary care physician if you do not have one) for your aftercare needs so that they can reassess your need for medications and monitor your lab values. If you do not have a primary care physician, you can call [phone] for a physician referral.     Increase activity slowly    Complete by:  As directed            ALLERGIES:  Allergies  Allergen Reactions  . Codeine Nausea And Vomiting     Current Discharge Medication List    START taking these medications   Details   atorvastatin (LIPITOR) 20 MG tablet Take 1 tablet (20 mg total) by mouth daily at 6 PM. Qty: 30 tablet, Refills: 2    clopidogrel (PLAVIX) 75 MG tablet Take 1 tablet (75 mg total) by mouth daily. Qty: 30 tablet, Refills: 2      CONTINUE these medications which have CHANGED   Details  amLODipine (NORVASC) 5 MG tablet Take 1 tablet (5 mg total) by mouth daily after breakfast. RESUME AFTER 2 DAYS    valsartan-hydrochlorothiazide (DIOVAN-HCT) 160-12.5 MG tablet Take 1 tablet by mouth daily after breakfast. RESUME AFTER 2 DAYS.      CONTINUE these medications which have NOT CHANGED   Details  ALPRAZolam (XANAX) 0.25 MG tablet Take 0.25 mg by mouth at bedtime as needed.    Calcium Carbonate-Vitamin D (CALCIUM + D PO) Take 1 tablet by mouth daily.     cholecalciferol (VITAMIN D) 1000 UNITS tablet Take 1,000 Units by mouth daily.     diphenhydrAMINE (BENADRYL) 25 mg capsule Take 25 mg by mouth every 6 (six) hours as needed for allergies.    glucosamine-chondroitin 500-400 MG tablet Take 1 tablet by mouth daily.    Multiple Vitamin (MULTIVITAMIN) tablet Take 1 tablet by mouth daily.    naproxen sodium (ANAPROX) 220 MG tablet Take 220 mg by mouth 2 (two) times daily as needed (for pain).    ranitidine (ZANTAC) 150 MG tablet Take 150 mg by mouth 2 (two) times daily.  STOP taking these medications     aspirin 81 MG chewable tablet        Follow-up Information    Follow up with Xu,Jindong, MD In 2 months.   Specialty:  Neurology   Why:  Stroke [facility], Office will call you with appointment date & time   Contact information:   [address] [address] [phone]       Follow up with Irven Shelling, MD. Schedule an appointment as soon as possible for a visit in 1 week.   Specialty:  Internal Medicine   Why:  post hospitalization follow up   Contact information:   [address] [place] [address] [phone]        TOTAL DISCHARGE TIME: 35 minutes  [facility]  [facility] Pager [phone]  10/22/2015, 3:07 PM

## 2015-10-22 NOTE — Care Management Note (Signed)
Case Management Note  Patient Details  Name: Jordan Lane MRN: [id number] Date of Birth: 03-18-1946  Subjective/Objective:                    Action/Plan: Patient being discharged home today with orders for home health PT. CM met with the patient and her husband and provided them a list of home health agencies in the [facility] area. They selected [facility]. Miranda with [facility] notified and accepted the referral. Bedside RN updated.   Expected Discharge Date:                  Expected Discharge Plan:  [place]  In-House Referral:     Discharge planning Services  CM Consult  Post Acute Care Choice:  Home Health Choice offered to:  Patient, Spouse  DME Arranged:    DME Agency:     HH Arranged:  PT [place]:  [place]  Status of Service:  Completed, signed off  Medicare Important Message Given:    Date Medicare IM Given:    Medicare IM give by:    Date Additional Medicare IM Given:    Additional Medicare Important Message give by:     If discussed at [place] of Stay Meetings, dates discussed:    Additional Comments:  Pollie Friar, RN 10/22/2015, 2:57 PM

## 2015-10-22 NOTE — Progress Notes (Signed)
Pt arrived to room 5c09 from ED.  Pt is alert and oriented and husband is at bedside.  Safety measures in place.  Will continue to monitor.   Fredrich Romans, RN

## 2015-10-22 NOTE — ED Notes (Signed)
hospitalist at the bedside 

## 2015-10-22 NOTE — Care Management Note (Signed)
Case Management Note  Patient Details  Name: Jordan Lane MRN: [id number] Date of Birth: 12/16/1945  Subjective/Objective:   Patient admitted with CVA. Patient is from home with her spouse.                  Action/Plan: Await PT/OT recommendations. CM will continue to follow for discharge needs.   Expected Discharge Date:                  Expected Discharge Plan:     In-House Referral:     Discharge planning Services     Post Acute Care Choice:    Choice offered to:     DME Arranged:    DME Agency:     HH Arranged:    HH Agency:     Status of Service:  In process, will continue to follow  Medicare Important Message Given:    Date Medicare IM Given:    Medicare IM give by:    Date Additional Medicare IM Given:    Additional Medicare Important Message give by:     If discussed at [place] of Stay Meetings, dates discussed:    Additional Comments:  Pollie Friar, RN 10/22/2015, 9:54 AM

## 2015-10-22 NOTE — Discharge Instructions (Signed)
Ischemic Stroke Treated Without Warfarin °An ischemic stroke is the sudden death of brain tissue. Blood carries oxygen to all areas of your body. This type of stroke happens when your blood does not flow to your brain like normal. Your brain cannot get the oxygen it needs. This is an emergency. °Symptoms of any stroke usually happen all of the sudden. You may notice them when you wake up. They can include sudden: °· Weakness or loss of feeling in your face, arm, or leg, especially on one side of your body. °· Confusion. °· Trouble talking or understanding. °· Trouble seeing. °· Trouble walking or moving your arms or legs. °· Dizziness. °· Loss of balance or coordination. °· Severe headache with no known cause. °Get help as soon as any of these problems start. This is important so your doctor can treat you right away with:  °· Medicines that dissolve blood clots. °· A device to remove a blood clot. °A few hours after the start of your symptoms, your doctor may treat you with: °· Oxygen. °· Medicines. °· A surgery to widen blood vessels. °RISK FACTORS  °Risk factors are things that make it more likely for you to have a stroke. These include: °· High blood pressure. °· High cholesterol. °· Diabetes. °· Heart disease. °· Having a buildup of fatty deposits in the blood vessels. °· Having an abnormal heart rhythm (atrial fibrillation). °· Being very overweight (obese). °· Smoking. °· Taking birth control pills, especially if you smoke. °· Not being active. °· Having a diet that is high in fats, salt, and calories. °· Drinking too much alcohol. °· Using illegal drugs. °· Being African American. °· Being over the age of 55. °· Having a family history of stroke. °· Having a history of blood clots, stroke, warning stroke (transient ischemic attack, TIA), or heart attack. °· Sickle cell disease. °HOME CARE °· Take all medicines exactly as told by your doctor. Understand all your medicine instructions. °· If you are able to  swallow, eat healthy foods. Eat 5 or more servings of fruits and vegetables each day. Eat soft foods or pureed foods, or eat small bites of food so you do not choke. °· Stay active. Try to get at least 30 minutes of activity on most or all days. °· Do not smoke. °· Limit or stop alcohol use. °· Keep your home safe so you do not fall. Try: °¨ Putting grab bars in the bedroom and bathroom. °¨ Raising toilet seats. °¨ Putting a seat in the shower. °· Go to physical, occupational, and speech therapy sessions as told by your doctor. °· Use a walker or a cane at all times if told to do so. °· Keep all doctor visits as told. °GET HELP RIGHT AWAY IF:  °· You have sudden weakness or loss of feeling in your face, arm, or leg, especially on one side of your body. °· You are suddenly confused. °· You have trouble talking or understanding. °· You have sudden trouble seeing. °· You have sudden trouble walking or moving your arms or legs. °· You have sudden dizziness. °· You lose your balance or your movements are not smooth. °· You have a sudden, severe headache with no known cause. °· You are partly unaware or totally unaware of what is going on around you. °Any of these symptoms may be a sign of an emergency. Do not wait to see if the symptoms go away. Call for help (911 in [place] Do not   drive yourself to the hospital. °  °This information is not intended to replace advice given to you by your health care provider. Make sure you discuss any questions you have with your health care provider. °  °Document Released: 08/31/2014 Document Reviewed: 08/31/2014 °Elsevier Interactive Patient Education ©2016 [employer] ° ° °

## 2015-10-22 NOTE — ED Notes (Signed)
Patient returned from MRI.

## 2015-10-22 NOTE — Progress Notes (Signed)
Pt discharging with family at this time taking all personal belongings. IV discontinued, dry dressing applied. Discharge instructions provided with verbal understanding. Pt to schedule follow up appt. No noted distress.

## 2015-10-23 LAB — HEMOGLOBIN A1C
Hgb A1c MFr Bld: 5.9 % — ABNORMAL HIGH (ref 4.8–5.6)
MEAN PLASMA GLUCOSE: 123 mg/dL

## 2015-10-25 DIAGNOSIS — Z90710 Acquired absence of both cervix and uterus: Secondary | ICD-10-CM | POA: Diagnosis not present

## 2015-10-25 DIAGNOSIS — I69393 Ataxia following cerebral infarction: Secondary | ICD-10-CM | POA: Diagnosis not present

## 2015-10-25 DIAGNOSIS — Z853 Personal history of malignant neoplasm of breast: Secondary | ICD-10-CM | POA: Diagnosis not present

## 2015-10-25 DIAGNOSIS — K219 Gastro-esophageal reflux disease without esophagitis: Secondary | ICD-10-CM | POA: Diagnosis not present

## 2015-10-25 DIAGNOSIS — M1990 Unspecified osteoarthritis, unspecified site: Secondary | ICD-10-CM | POA: Diagnosis not present

## 2015-10-25 DIAGNOSIS — I10 Essential (primary) hypertension: Secondary | ICD-10-CM | POA: Diagnosis not present

## 2015-10-25 DIAGNOSIS — Z9049 Acquired absence of other specified parts of digestive tract: Secondary | ICD-10-CM | POA: Diagnosis not present

## 2015-10-25 DIAGNOSIS — E785 Hyperlipidemia, unspecified: Secondary | ICD-10-CM | POA: Diagnosis not present

## 2015-10-25 DIAGNOSIS — Z96653 Presence of artificial knee joint, bilateral: Secondary | ICD-10-CM | POA: Diagnosis not present

## 2015-10-28 DIAGNOSIS — K219 Gastro-esophageal reflux disease without esophagitis: Secondary | ICD-10-CM | POA: Diagnosis not present

## 2015-10-28 DIAGNOSIS — I69393 Ataxia following cerebral infarction: Secondary | ICD-10-CM | POA: Diagnosis not present

## 2015-10-28 DIAGNOSIS — I10 Essential (primary) hypertension: Secondary | ICD-10-CM | POA: Diagnosis not present

## 2015-10-28 DIAGNOSIS — Z90710 Acquired absence of both cervix and uterus: Secondary | ICD-10-CM | POA: Diagnosis not present

## 2015-10-28 DIAGNOSIS — Z853 Personal history of malignant neoplasm of breast: Secondary | ICD-10-CM | POA: Diagnosis not present

## 2015-10-28 DIAGNOSIS — Z1212 Encounter for screening for malignant neoplasm of rectum: Secondary | ICD-10-CM | POA: Diagnosis not present

## 2015-10-28 DIAGNOSIS — Z96653 Presence of artificial knee joint, bilateral: Secondary | ICD-10-CM | POA: Diagnosis not present

## 2015-10-28 DIAGNOSIS — Z9049 Acquired absence of other specified parts of digestive tract: Secondary | ICD-10-CM | POA: Diagnosis not present

## 2015-10-28 DIAGNOSIS — Z1211 Encounter for screening for malignant neoplasm of colon: Secondary | ICD-10-CM | POA: Diagnosis not present

## 2015-10-28 DIAGNOSIS — E785 Hyperlipidemia, unspecified: Secondary | ICD-10-CM | POA: Diagnosis not present

## 2015-10-28 DIAGNOSIS — M1990 Unspecified osteoarthritis, unspecified site: Secondary | ICD-10-CM | POA: Diagnosis not present

## 2015-10-29 DIAGNOSIS — I639 Cerebral infarction, unspecified: Secondary | ICD-10-CM | POA: Diagnosis not present

## 2015-10-29 DIAGNOSIS — I10 Essential (primary) hypertension: Secondary | ICD-10-CM | POA: Diagnosis not present

## 2015-10-29 DIAGNOSIS — E781 Pure hyperglyceridemia: Secondary | ICD-10-CM | POA: Diagnosis not present

## 2015-10-30 DIAGNOSIS — E785 Hyperlipidemia, unspecified: Secondary | ICD-10-CM | POA: Diagnosis not present

## 2015-10-30 DIAGNOSIS — I10 Essential (primary) hypertension: Secondary | ICD-10-CM | POA: Diagnosis not present

## 2015-10-30 DIAGNOSIS — I69393 Ataxia following cerebral infarction: Secondary | ICD-10-CM | POA: Diagnosis not present

## 2015-10-30 DIAGNOSIS — Z9049 Acquired absence of other specified parts of digestive tract: Secondary | ICD-10-CM | POA: Diagnosis not present

## 2015-10-30 DIAGNOSIS — K219 Gastro-esophageal reflux disease without esophagitis: Secondary | ICD-10-CM | POA: Diagnosis not present

## 2015-10-30 DIAGNOSIS — M1990 Unspecified osteoarthritis, unspecified site: Secondary | ICD-10-CM | POA: Diagnosis not present

## 2015-10-30 DIAGNOSIS — Z853 Personal history of malignant neoplasm of breast: Secondary | ICD-10-CM | POA: Diagnosis not present

## 2015-10-30 DIAGNOSIS — Z90710 Acquired absence of both cervix and uterus: Secondary | ICD-10-CM | POA: Diagnosis not present

## 2015-10-30 DIAGNOSIS — Z96653 Presence of artificial knee joint, bilateral: Secondary | ICD-10-CM | POA: Diagnosis not present

## 2015-11-01 DIAGNOSIS — M1990 Unspecified osteoarthritis, unspecified site: Secondary | ICD-10-CM | POA: Diagnosis not present

## 2015-11-01 DIAGNOSIS — E785 Hyperlipidemia, unspecified: Secondary | ICD-10-CM | POA: Diagnosis not present

## 2015-11-01 DIAGNOSIS — Z853 Personal history of malignant neoplasm of breast: Secondary | ICD-10-CM | POA: Diagnosis not present

## 2015-11-01 DIAGNOSIS — K219 Gastro-esophageal reflux disease without esophagitis: Secondary | ICD-10-CM | POA: Diagnosis not present

## 2015-11-01 DIAGNOSIS — I10 Essential (primary) hypertension: Secondary | ICD-10-CM | POA: Diagnosis not present

## 2015-11-01 DIAGNOSIS — Z90710 Acquired absence of both cervix and uterus: Secondary | ICD-10-CM | POA: Diagnosis not present

## 2015-11-01 DIAGNOSIS — Z9049 Acquired absence of other specified parts of digestive tract: Secondary | ICD-10-CM | POA: Diagnosis not present

## 2015-11-01 DIAGNOSIS — I69393 Ataxia following cerebral infarction: Secondary | ICD-10-CM | POA: Diagnosis not present

## 2015-11-01 DIAGNOSIS — Z96653 Presence of artificial knee joint, bilateral: Secondary | ICD-10-CM | POA: Diagnosis not present

## 2015-11-04 DIAGNOSIS — Z9049 Acquired absence of other specified parts of digestive tract: Secondary | ICD-10-CM | POA: Diagnosis not present

## 2015-11-04 DIAGNOSIS — I69393 Ataxia following cerebral infarction: Secondary | ICD-10-CM | POA: Diagnosis not present

## 2015-11-04 DIAGNOSIS — M1990 Unspecified osteoarthritis, unspecified site: Secondary | ICD-10-CM | POA: Diagnosis not present

## 2015-11-04 DIAGNOSIS — Z90710 Acquired absence of both cervix and uterus: Secondary | ICD-10-CM | POA: Diagnosis not present

## 2015-11-04 DIAGNOSIS — K219 Gastro-esophageal reflux disease without esophagitis: Secondary | ICD-10-CM | POA: Diagnosis not present

## 2015-11-04 DIAGNOSIS — I10 Essential (primary) hypertension: Secondary | ICD-10-CM | POA: Diagnosis not present

## 2015-11-04 DIAGNOSIS — Z853 Personal history of malignant neoplasm of breast: Secondary | ICD-10-CM | POA: Diagnosis not present

## 2015-11-04 DIAGNOSIS — E785 Hyperlipidemia, unspecified: Secondary | ICD-10-CM | POA: Diagnosis not present

## 2015-11-04 DIAGNOSIS — Z96653 Presence of artificial knee joint, bilateral: Secondary | ICD-10-CM | POA: Diagnosis not present

## 2015-11-06 DIAGNOSIS — E785 Hyperlipidemia, unspecified: Secondary | ICD-10-CM | POA: Diagnosis not present

## 2015-11-06 DIAGNOSIS — K219 Gastro-esophageal reflux disease without esophagitis: Secondary | ICD-10-CM | POA: Diagnosis not present

## 2015-11-06 DIAGNOSIS — Z90710 Acquired absence of both cervix and uterus: Secondary | ICD-10-CM | POA: Diagnosis not present

## 2015-11-06 DIAGNOSIS — Z96653 Presence of artificial knee joint, bilateral: Secondary | ICD-10-CM | POA: Diagnosis not present

## 2015-11-06 DIAGNOSIS — I69393 Ataxia following cerebral infarction: Secondary | ICD-10-CM | POA: Diagnosis not present

## 2015-11-06 DIAGNOSIS — M1990 Unspecified osteoarthritis, unspecified site: Secondary | ICD-10-CM | POA: Diagnosis not present

## 2015-11-06 DIAGNOSIS — Z9049 Acquired absence of other specified parts of digestive tract: Secondary | ICD-10-CM | POA: Diagnosis not present

## 2015-11-06 DIAGNOSIS — I10 Essential (primary) hypertension: Secondary | ICD-10-CM | POA: Diagnosis not present

## 2015-11-06 DIAGNOSIS — Z853 Personal history of malignant neoplasm of breast: Secondary | ICD-10-CM | POA: Diagnosis not present

## 2015-11-20 DIAGNOSIS — D485 Neoplasm of uncertain behavior of skin: Secondary | ICD-10-CM | POA: Diagnosis not present

## 2015-11-20 DIAGNOSIS — L720 Epidermal cyst: Secondary | ICD-10-CM | POA: Diagnosis not present

## 2015-11-20 DIAGNOSIS — L570 Actinic keratosis: Secondary | ICD-10-CM | POA: Diagnosis not present

## 2015-11-20 DIAGNOSIS — L821 Other seborrheic keratosis: Secondary | ICD-10-CM | POA: Diagnosis not present

## 2015-11-20 DIAGNOSIS — D234 Other benign neoplasm of skin of scalp and neck: Secondary | ICD-10-CM | POA: Diagnosis not present

## 2015-11-20 DIAGNOSIS — C4441 Basal cell carcinoma of skin of scalp and neck: Secondary | ICD-10-CM | POA: Diagnosis not present

## 2015-11-20 DIAGNOSIS — D225 Melanocytic nevi of trunk: Secondary | ICD-10-CM | POA: Diagnosis not present

## 2015-11-20 DIAGNOSIS — D2271 Melanocytic nevi of right lower limb, including hip: Secondary | ICD-10-CM | POA: Diagnosis not present

## 2015-12-01 DIAGNOSIS — B029 Zoster without complications: Secondary | ICD-10-CM

## 2015-12-01 HISTORY — DX: Zoster without complications: B02.9

## 2015-12-11 DIAGNOSIS — Z85828 Personal history of other malignant neoplasm of skin: Secondary | ICD-10-CM | POA: Diagnosis not present

## 2015-12-11 DIAGNOSIS — C4441 Basal cell carcinoma of skin of scalp and neck: Secondary | ICD-10-CM | POA: Diagnosis not present

## 2015-12-27 ENCOUNTER — Encounter: Payer: Self-pay | Admitting: Neurology

## 2015-12-27 ENCOUNTER — Ambulatory Visit (INDEPENDENT_AMBULATORY_CARE_PROVIDER_SITE_OTHER): Payer: Commercial Managed Care - HMO | Admitting: Neurology

## 2015-12-27 VITALS — BP 122/75 | HR 76 | Ht 63.0 in | Wt 178.4 lb

## 2015-12-27 DIAGNOSIS — I639 Cerebral infarction, unspecified: Secondary | ICD-10-CM

## 2015-12-27 DIAGNOSIS — E785 Hyperlipidemia, unspecified: Secondary | ICD-10-CM | POA: Diagnosis not present

## 2015-12-27 DIAGNOSIS — I10 Essential (primary) hypertension: Secondary | ICD-10-CM | POA: Diagnosis not present

## 2015-12-27 NOTE — Patient Instructions (Signed)
-   continue lipitor and plavix for stroke prevention - check BP at home and calibrate BP device with PCP - Follow up with your primary care physician for stroke risk factor modification. Recommend maintain blood pressure goal <130/80, diabetes with hemoglobin A1c goal below 6.5% and lipids with LDL cholesterol goal below 70 mg/dL.  - healthy diet and regular exercise - follow up in 3 months.

## 2015-12-27 NOTE — Progress Notes (Signed)
STROKE NEUROLOGY FOLLOW UP NOTE  NAME: Jordan Lane DOB: [birth year]  REASON FOR VISIT: stroke follow up HISTORY FROM: pt and chart  Today we had the pleasure of seeing Jordan Lane in follow-up at our Neurology Clinic. Pt was accompanied by husband.   History Summary [name] is a 70 y.o. female with history of hypertension and GERD was admitted on 10/21/15 for unsteady gait with veering to the left on walking. 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 CTA head and neck showed mild left VA atherosclerosis. TTE and A1C unremarkable but LDL 114. Her ASA was switched to plavix and added on lipitor. Symptoms improved well and she was discharged in good condition.  Interval History During the interval time, the patient has been doing well. No recurrent stroke like symptoms. Her walking back to normal but still has mild numbness at left face subjectively. BP 122/75, on 3 BP meds. However, at home her BP device always reads at 150/80. She has not calibrated her BP machine yet. Denies s/s of OSA. No other complains. Finished PT/OT.     REVIEW OF SYSTEMS: Full 14 system review of systems performed and notable only for those listed below and in HPI above, all others are negative:  Constitutional:   Cardiovascular:  Ear/Nose/Throat:   Skin:  Eyes:  Eye pain on the left Respiratory:   Gastroitestinal:   Genitourinary:  Hematology/Lymphatic:   Endocrine:  Musculoskeletal:   Allergy/Immunology:   Neurological:   Psychiatric:  Sleep:   The following represents the patient's updated allergies and side effects list: Allergies  Allergen Reactions  . Codeine Nausea And Vomiting    The neurologically relevant items on the patient's problem list were reviewed on today's visit.  Neurologic Examination  A problem focused neurological exam (12 or more points of the single system neurologic examination, vital signs counts as 1 point, cranial nerves count for 8  points) was performed.  Blood pressure 122/75, pulse 76, height 5\' 3"  (1.6 m), weight 178 lb 6.4 oz (80.922 kg).  General - Well nourished, well developed, in no apparent distress.  Ophthalmologic - Sharp disc margins OU.   Cardiovascular - Regular rate and rhythm with no murmur.  Mental Status -  Level of arousal and orientation to time, place, and person were intact. Language including expression, naming, repetition, comprehension was assessed and found intact. Fund of Knowledge was assessed and was intact.  Cranial Nerves II - XII - II - Visual field intact OU. III, IV, VI - Extraocular movements intact. V - Facial sensation intact bilaterally. VII - Facial movement intact bilaterally. VIII - Hearing & vestibular intact bilaterally. X - Palate elevates symmetrically. XI - Chin turning & shoulder shrug intact bilaterally. XII - Tongue protrusion intact.  Motor Strength - The patient's strength was normal in all extremities and pronator drift was absent.  Bulk was normal and fasciculations were absent.   Motor Tone - Muscle tone was assessed at the neck and appendages and was normal.  Reflexes - The patient's reflexes were 1+ in all extremities and she had no pathological reflexes.  Sensory - Light touch, temperature/pinprick, vibration and proprioception, and Romberg testing were assessed and were normal.    Coordination - The patient had normal movements in the hands and feet with no ataxia or dysmetria.  Tremor was absent.  Gait and Station - The patient's transfers, posture, gait, station, and turns were observed as normal.  Data reviewed: I personally reviewed the images  and agree with the radiology interpretations.  Ct Head Wo Contrast 10/22/2015 MR detected subtle acute left lateral medullary infarct not appreciated by CT. Prominent small vessel disease type changes. Remote small infarcts coronal radiata/ centrum semiovale bilaterally and tiny remote right thalamic  infarct. 10/21/2015 1. No acute intracranial pathology seen on CT. 2. Scattered small vessel ischemic microangiopathy.   CTA HEAD 10/22/2015 Left vertebral artery is dominant. Focal plaque distal left vertebral artery with mild to moderate narrowing. This is proximal to the takeoff of the left posterior inferior cerebellar artery. Mild narrowing and irregularity of the posterior inferior cerebellar artery bilaterally. Very mild narrowing distal right vertebral artery. No significant stenosis of the basilar artery. Mild to moderate narrowing P2 segment left posterior cerebral artery. Mild to moderate narrowing right posterior cerebral artery proximal and distal branch vessels. Anterior circulation without medium or large size vessel significant stenosis or occlusion.  CTA NECK 10/22/2015 No evidence the hemodynamically significant stenosis involving either carotid bifurcation. Carotid arteries are ectatic. Mild narrowing proximal right vertebral artery. Very mild narrowing proximal left vertebral artery. Ectasia of the proximal left vertebral artery with fold and mild narrowing.   Mr Brain Wo Contrast 10/22/2015 1. 7 mm focus of restricted diffusion within the lateral left medulla, suspicious for a small acute ischemic infarct. 2. Cerebral atrophy with moderate chronic small vessel ischemic disease and multiple small remote lacunar infarcts.  2D Echocardiogram  - Left ventricle: The cavity size was normal. Wall thickness wasincreased in a pattern of mild LVH. Systolic function was normal.The estimated ejection fraction was in the range of 55% to 60%.Wall motion was normal; there were no regional wall motionabnormalities. Doppler parameters are consistent with abnormalleft ventricular relaxation (grade 1 diastolic dysfunction). - Left atrium: The atrium was mildly dilated.  Component     Latest Ref Rng 10/22/2015  Cholesterol     0 - 200 mg/dL 195  Triglycerides     <150 mg/dL 192 (H)   HDL Cholesterol     >40 mg/dL 43  Total CHOL/HDL Ratio      4.5  VLDL     0 - 40 mg/dL 38  LDL (calc)     0 - 99 mg/dL 114 (H)  Hemoglobin A1C     4.8 - 5.6 % 5.9 (H)  Mean Plasma Glucose      123    Assessment: As you may recall, she is a 70 y.o. Caucasian female with PMH of HTN and GERD was admitted on 10/21/15 for small left lateral medullary infarct. CTA head and neck showed mild left VA atherosclerosis. TTE and A1C unremarkable and LDL 114. Her ASA was switched to plavix and added on lipitor. Pt doing well only has complains of subjective left facial mild numbness. BP stable. Denies s/s of OSA.  Plan:  - continue lipitor and plavix for stroke prevention - check BP at home and calibrate BP device with PCP - Follow up with your primary care physician for stroke risk factor modification. Recommend maintain blood pressure goal <130/80, diabetes with hemoglobin A1c goal below 6.5% and lipids with LDL cholesterol goal below 70 mg/dL.  - healthy diet and regular exercise - follow up in 3-4 months.  I spent more than 25 minutes of face to face time with the patient. Greater than 50% of time was spent in counseling and coordination of care. We have discussed about stroke etiology and prevention.   No orders of the defined types were placed in this encounter.  No orders of the defined types were placed in this encounter.    Patient Instructions  - continue lipitor and plavix for stroke prevention - check BP at home and calibrate BP device with PCP - Follow up with your primary care physician for stroke risk factor modification. Recommend maintain blood pressure goal <130/80, diabetes with hemoglobin A1c goal below 6.5% and lipids with LDL cholesterol goal below 70 mg/dL.  - healthy diet and regular exercise - follow up in 3 months.    Rosalin Hawking, MD PhD [facility] [address], [place], [place] [address] [phone]

## 2016-01-07 ENCOUNTER — Telehealth: Payer: Self-pay | Admitting: Neurology

## 2016-01-07 DIAGNOSIS — R52 Pain, unspecified: Secondary | ICD-10-CM

## 2016-01-07 MED ORDER — LIDOCAINE 5 % EX OINT: 1.0000 "application " | TOPICAL_OINTMENT | CUTANEOUS | Status: DC | PRN

## 2016-01-07 NOTE — Telephone Encounter (Signed)
Rn call patient back at [phone]. Pt stated her face is tingling an burning on left side of face. Pt stated she has it off and on since November 2016 since her last stroke. Pt stated it has not increase since November 2016. Patient wanted to know if there was anything Dr.Xu can do about the tingling.Patient did not state she was having any numbness. Pt was last seen January 27,2017 at Dr.Xu for hospital follow up. Rn advise patient to seek the nearest Ed of her current symptoms. PT stated she did not think it was a stroke. PT did not want to go to the nearest ED but wanted a call from Dr. Erlinda Hong. Message will be sent to Dr.Xu.

## 2016-01-07 NOTE — Telephone Encounter (Signed)
Called the pt back and she complained of left face tingling and burning on and off, worse with stress and anxiety. She has had this since the stroke in Nov. 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 I offered her low dose gabapentin, but she would like topic cream for local use. I prescribed her lidocaine cream and she would like to try.   Rosalin Hawking, MD PhD Stroke Neurology 01/07/2016 5:43 PM

## 2016-01-07 NOTE — Telephone Encounter (Signed)
Patient is calling and states that she had a stroke October 21, 2015 and that her skin is tingling and  burning on the left side of her face and head.  Please call [phone] or [phone].  Thanks!

## 2016-01-08 NOTE — Telephone Encounter (Signed)
Rn call [employer] appeal number because patients order for lidocaine 5% ointment was denied by patients insurance. Rn had to do a PA and it was denied.Michelle(RN) call the appeal line and was on the phone for 20 minutes to find a alternative cream for patient. [employer] appeals stated there was a OTC medication call aspercreme with lidocaine maximum strength 4%. Rn will call patient.Pt can go to [facility] and [facility] and buy for cheaper price.

## 2016-01-08 NOTE — Telephone Encounter (Signed)
Unfortunately, pt insurance did not cover for lidocaine cream but stated that pt can have the alternative medication OTC with Aspercreme with 4% lidocaine cream. I think this is a good alternative with cheaper price. Hi, Katrina, could you please call the pt back and ask her to buy OTC with this cream for her burning pain? Thanks.  Rosalin Hawking, MD PhD Stroke Neurology 01/08/2016 10:34 AM

## 2016-01-08 NOTE — Telephone Encounter (Signed)
Rn call patient back using the OTC cream. Rn gave Dr.Xu advice. Pt will pick up copy and description of medication. At [facility].

## 2016-01-30 DIAGNOSIS — F4320 Adjustment disorder, unspecified: Secondary | ICD-10-CM | POA: Diagnosis not present

## 2016-01-30 DIAGNOSIS — J069 Acute upper respiratory infection, unspecified: Secondary | ICD-10-CM | POA: Diagnosis not present

## 2016-02-27 DIAGNOSIS — I6930 Unspecified sequelae of cerebral infarction: Secondary | ICD-10-CM | POA: Diagnosis not present

## 2016-02-27 DIAGNOSIS — I10 Essential (primary) hypertension: Secondary | ICD-10-CM | POA: Diagnosis not present

## 2016-02-27 DIAGNOSIS — E781 Pure hyperglyceridemia: Secondary | ICD-10-CM | POA: Diagnosis not present

## 2016-04-01 ENCOUNTER — Encounter: Payer: Self-pay | Admitting: Neurology

## 2016-04-01 ENCOUNTER — Ambulatory Visit (INDEPENDENT_AMBULATORY_CARE_PROVIDER_SITE_OTHER): Payer: Commercial Managed Care - HMO | Admitting: Neurology

## 2016-04-01 VITALS — BP 120/80 | HR 74 | Ht 63.0 in | Wt 176.6 lb

## 2016-04-01 DIAGNOSIS — E785 Hyperlipidemia, unspecified: Secondary | ICD-10-CM

## 2016-04-01 DIAGNOSIS — I10 Essential (primary) hypertension: Secondary | ICD-10-CM

## 2016-04-01 DIAGNOSIS — I639 Cerebral infarction, unspecified: Secondary | ICD-10-CM | POA: Diagnosis not present

## 2016-04-01 NOTE — Patient Instructions (Signed)
-   continue lipitor and plavix for stroke prevention - check BP at home  - Follow up with your primary care physician for stroke risk factor modification. Recommend maintain blood pressure goal <130/80, diabetes with hemoglobin A1c goal below 6.5% and lipids with LDL cholesterol goal below 70 mg/dL.  - healthy diet and regular exercise - follow up in one year.

## 2016-04-01 NOTE — Progress Notes (Signed)
STROKE NEUROLOGY FOLLOW UP NOTE  NAME: Jordan Lane DOB: 1946-01-14  REASON FOR VISIT: stroke follow up HISTORY FROM: pt and chart  Today we had Jordan pleasure of seeing Jordan Lane in follow-up at our Neurology Clinic. Pt was accompanied by husband.   History Summary Jordan Lane is a 70 y.o. female with history of hypertension and GERD was admitted on 10/21/15 for unsteady gait with veering to Jordan left on walking. 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 CTA head and neck showed mild left VA atherosclerosis. TTE and A1C unremarkable but LDL 114. Her ASA was switched to plavix and added on lipitor. Symptoms improved well and she was discharged in good condition.  12/27/15 follow up - Jordan Lane has been doing well. No recurrent stroke like symptoms. Her walking back to normal but still has mild numbness at left face subjectively. BP 122/75, on 3 BP meds. However, at home her BP device always reads at 150/80. She has not calibrated her BP machine yet. Denies s/s of OSA. No other complains. Finished PT/OT.    Interval History During Jordan interval time, pt still has left face tingling, but getting better. Jordan cream helped for Jordan tingling. She stated when there is a lot of stress Jordan tingling would worsen. She lost her daughter in 01/2016 so she was not in good mood and in depressed mood so far. Her Bp at home in good control and her cholesterol much better last check with PCP. Bp today 120/80.   REVIEW OF SYSTEMS: Full 14 system review of systems performed and notable only for those listed below and in HPI above, all others are negative:  Constitutional:   Cardiovascular:  Ear/Nose/Throat:   Skin:  Eyes:   Respiratory:   Gastroitestinal:   Genitourinary:  Hematology/Lymphatic:   Endocrine:  Musculoskeletal:   Allergy/Immunology:   Neurological:   Psychiatric:  Sleep:   Jordan following represents Jordan Lane's updated allergies and side effects  list: Allergies  Allergen Reactions  . Codeine Nausea And Vomiting    Jordan neurologically relevant items on Jordan Lane's problem list were reviewed on today's visit.  Neurologic Examination  A problem focused neurological exam (12 or more points of Jordan single system neurologic examination, vital signs counts as 1 point, cranial nerves count for 8 points) was performed.  Blood pressure 120/80, pulse 74, height 5\' 3"  (1.6 m), weight 176 lb 9.6 oz (80.105 kg).  General - Well nourished, well developed, in no apparent distress.  Ophthalmologic - Sharp disc margins OU.   Cardiovascular - Regular rate and rhythm with no murmur.  Mental Status -  Level of arousal and orientation to time, place, and person were intact. Language including expression, naming, repetition, comprehension was assessed and found intact. Fund of Knowledge was assessed and was intact.  Cranial Nerves II - XII - II - Visual field intact OU. III, IV, VI - Extraocular movements intact. V - Facial sensation intact bilaterally. VII - Facial movement intact bilaterally. VIII - Hearing & vestibular intact bilaterally. X - Palate elevates symmetrically. XI - Chin turning & shoulder shrug intact bilaterally. XII - Tongue protrusion intact.  Motor Strength - Jordan Lane's strength was normal in all extremities and pronator drift was absent.  Bulk was normal and fasciculations were absent.   Motor Tone - Muscle tone was assessed at Jordan neck and appendages and was normal.  Reflexes - Jordan Lane's reflexes were 1+ in all extremities and she had no pathological  reflexes.  Sensory - Light touch, temperature/pinprick, vibration and proprioception, and Romberg testing were assessed and were normal.    Coordination - Jordan Lane had normal movements in Jordan hands and feet with no ataxia or dysmetria.  Tremor was absent.  Gait and Station - Jordan Lane's transfers, posture, gait, station, and turns were observed as  normal.  Data reviewed: I personally reviewed Jordan images and agree with Jordan radiology interpretations.  Ct Head Wo Contrast 10/22/2015 MR detected subtle acute left lateral medullary infarct not appreciated by CT. Prominent small vessel disease type changes. Remote small infarcts coronal radiata/ centrum semiovale bilaterally and tiny remote right thalamic infarct. 10/21/2015 1. No acute intracranial pathology seen on CT. 2. Scattered small vessel ischemic microangiopathy.   CTA HEAD 10/22/2015 Left vertebral artery is dominant. Focal plaque distal left vertebral artery with mild to moderate narrowing. This is proximal to Jordan takeoff of Jordan left posterior inferior cerebellar artery. Mild narrowing and irregularity of Jordan posterior inferior cerebellar artery bilaterally. Very mild narrowing distal right vertebral artery. No significant stenosis of Jordan basilar artery. Mild to moderate narrowing P2 segment left posterior cerebral artery. Mild to moderate narrowing right posterior cerebral artery proximal and distal branch vessels. Anterior circulation without medium or large size vessel significant stenosis or occlusion.  CTA NECK 10/22/2015 No evidence Jordan hemodynamically significant stenosis involving either carotid bifurcation. Carotid arteries are ectatic. Mild narrowing proximal right vertebral artery. Very mild narrowing proximal left vertebral artery. Ectasia of Jordan proximal left vertebral artery with fold and mild narrowing.   Mr Brain Wo Contrast 10/22/2015 1. 7 mm focus of restricted diffusion within Jordan lateral left medulla, suspicious for a small acute ischemic infarct. 2. Cerebral atrophy with moderate chronic small vessel ischemic disease and multiple small remote lacunar infarcts.  2D Echocardiogram  - Left ventricle: Jordan cavity size was normal. Wall thickness wasincreased in a pattern of mild LVH. Systolic function was normal.Jordan estimated ejection fraction was in Jordan range  of 55% to 60%.Wall motion was normal; there were no regional wall motionabnormalities. Doppler parameters are consistent with abnormalleft ventricular relaxation (grade 1 diastolic dysfunction). - Left atrium: Jordan atrium was mildly dilated.  Component     Latest Ref Rng 10/22/2015  Cholesterol     0 - 200 mg/dL 195  Triglycerides     <150 mg/dL 192 (H)  HDL Cholesterol     >40 mg/dL 43  Total CHOL/HDL Ratio      4.5  VLDL     0 - 40 mg/dL 38  LDL (calc)     0 - 99 mg/dL 114 (H)  Hemoglobin A1C     4.8 - 5.6 % 5.9 (H)  Mean Plasma Glucose      123    Assessment: As you may recall, she is a 70 y.o. Caucasian female with PMH of HTN and GERD was admitted on 10/21/15 for small left lateral medullary infarct. CTA head and neck showed mild left VA atherosclerosis. TTE and A1C unremarkable and LDL 114. Her ASA was switched to plavix and added on lipitor. Pt doing well only has complains of left facial tingling, worse with stress. BP stable. Denies s/s of OSA and declined sleep study. Left facial tingling getting better with cream.   Plan:  - continue lipitor and plavix for stroke prevention - check BP at home  - Follow up with your primary care physician for stroke risk factor modification. Recommend maintain blood pressure goal <130/80, diabetes with hemoglobin A1c goal below  6.5% and lipids with LDL cholesterol goal below 70 mg/dL.  - healthy diet and regular exercise - follow up in one year.     No orders of Jordan defined types were placed in this encounter.    No orders of Jordan defined types were placed in this encounter.    Lane Instructions  - continue lipitor and plavix for stroke prevention - check BP at home  - Follow up with your primary care physician for stroke risk factor modification. Recommend maintain blood pressure goal <130/80, diabetes with hemoglobin A1c goal below 6.5% and lipids with LDL cholesterol goal below 70 mg/dL.  - healthy diet and regular  exercise - follow up in one year.     Rosalin Hawking, MD PhD [facility] [address], [place], [place] [address] [phone]

## 2016-06-11 DIAGNOSIS — B0230 Zoster ocular disease, unspecified: Secondary | ICD-10-CM | POA: Diagnosis not present

## 2016-06-18 DIAGNOSIS — B0230 Zoster ocular disease, unspecified: Secondary | ICD-10-CM | POA: Diagnosis not present

## 2016-06-29 DIAGNOSIS — F329 Major depressive disorder, single episode, unspecified: Secondary | ICD-10-CM | POA: Diagnosis not present

## 2016-06-29 DIAGNOSIS — R002 Palpitations: Secondary | ICD-10-CM | POA: Diagnosis not present

## 2016-06-29 DIAGNOSIS — I10 Essential (primary) hypertension: Secondary | ICD-10-CM | POA: Diagnosis not present

## 2016-06-29 DIAGNOSIS — K219 Gastro-esophageal reflux disease without esophagitis: Secondary | ICD-10-CM | POA: Diagnosis not present

## 2016-07-06 ENCOUNTER — Other Ambulatory Visit: Payer: Self-pay | Admitting: Internal Medicine

## 2016-07-06 ENCOUNTER — Ambulatory Visit (INDEPENDENT_AMBULATORY_CARE_PROVIDER_SITE_OTHER): Payer: Commercial Managed Care - HMO

## 2016-07-06 ENCOUNTER — Encounter (INDEPENDENT_AMBULATORY_CARE_PROVIDER_SITE_OTHER): Payer: Self-pay

## 2016-07-06 DIAGNOSIS — R002 Palpitations: Secondary | ICD-10-CM

## 2016-07-06 DIAGNOSIS — I639 Cerebral infarction, unspecified: Secondary | ICD-10-CM

## 2016-07-06 DIAGNOSIS — I4891 Unspecified atrial fibrillation: Secondary | ICD-10-CM

## 2016-08-13 ENCOUNTER — Other Ambulatory Visit: Payer: Self-pay | Admitting: Gastroenterology

## 2016-09-01 ENCOUNTER — Telehealth: Payer: Self-pay

## 2016-09-01 NOTE — Telephone Encounter (Signed)
-----   Message from Rosalin Hawking, MD sent at 08/31/2016  5:56 PM EDT ----- Could you please let the patient know that the 30 day heart monitoring test done recently was negative for condition called afib. No medication changed needed at this time. Please continue current treatment. Thanks.  Rosalin Hawking, MD PhD Stroke Neurology 08/31/2016 5:56 PM

## 2016-09-01 NOTE — Telephone Encounter (Signed)
Notes Recorded by Marval Regal, RN on 09/01/2016 at 5:26 PM EDT RN call patient about 30 day cardiac monitor. Rn stated her MRI was negative for atrial fib. No medication change at this time. Please continue treatment plan. Pt was happy and verbalized understanding. ------

## 2016-09-02 DIAGNOSIS — Z23 Encounter for immunization: Secondary | ICD-10-CM | POA: Diagnosis not present

## 2016-09-14 ENCOUNTER — Encounter (HOSPITAL_COMMUNITY): Payer: Self-pay | Admitting: *Deleted

## 2016-09-14 DIAGNOSIS — N399 Disorder of urinary system, unspecified: Secondary | ICD-10-CM | POA: Diagnosis not present

## 2016-09-14 DIAGNOSIS — R42 Dizziness and giddiness: Secondary | ICD-10-CM | POA: Diagnosis not present

## 2016-09-21 ENCOUNTER — Ambulatory Visit (HOSPITAL_COMMUNITY): Payer: Commercial Managed Care - HMO | Admitting: Anesthesiology

## 2016-09-21 ENCOUNTER — Ambulatory Visit (HOSPITAL_COMMUNITY)
Admission: RE | Admit: 2016-09-21 | Discharge: 2016-09-21 | Disposition: A | Discharge status: Home / Self Care | Payer: Commercial Managed Care - HMO | Source: Ambulatory Visit | Attending: Gastroenterology | Admitting: Gastroenterology

## 2016-09-21 ENCOUNTER — Encounter (HOSPITAL_COMMUNITY)
Admission: RE | Disposition: A | Discharge status: Home / Self Care | Payer: Self-pay | Source: Ambulatory Visit | Attending: Gastroenterology

## 2016-09-21 ENCOUNTER — Encounter (HOSPITAL_COMMUNITY): Payer: Self-pay

## 2016-09-21 DIAGNOSIS — R1310 Dysphagia, unspecified: Secondary | ICD-10-CM | POA: Diagnosis not present

## 2016-09-21 DIAGNOSIS — I10 Essential (primary) hypertension: Secondary | ICD-10-CM | POA: Insufficient documentation

## 2016-09-21 DIAGNOSIS — K219 Gastro-esophageal reflux disease without esophagitis: Secondary | ICD-10-CM | POA: Insufficient documentation

## 2016-09-21 DIAGNOSIS — Z96653 Presence of artificial knee joint, bilateral: Secondary | ICD-10-CM | POA: Insufficient documentation

## 2016-09-21 DIAGNOSIS — Z9049 Acquired absence of other specified parts of digestive tract: Secondary | ICD-10-CM | POA: Insufficient documentation

## 2016-09-21 DIAGNOSIS — Z90710 Acquired absence of both cervix and uterus: Secondary | ICD-10-CM | POA: Diagnosis not present

## 2016-09-21 DIAGNOSIS — Z8673 Personal history of transient ischemic attack (TIA), and cerebral infarction without residual deficits: Secondary | ICD-10-CM | POA: Diagnosis not present

## 2016-09-21 DIAGNOSIS — K228 Other specified diseases of esophagus: Secondary | ICD-10-CM | POA: Diagnosis not present

## 2016-09-21 HISTORY — PX: ESOPHAGOGASTRODUODENOSCOPY (EGD) WITH PROPOFOL: SHX5813

## 2016-09-21 SURGERY — ESOPHAGOGASTRODUODENOSCOPY (EGD) WITH PROPOFOL
Anesthesia: Monitor Anesthesia Care

## 2016-09-21 MED ORDER — LIDOCAINE HCL (CARDIAC) 20 MG/ML IV SOLN
INTRAVENOUS | Status: DC | PRN
  Administered 2016-09-21: 100 mg via INTRAVENOUS

## 2016-09-21 MED ORDER — PROPOFOL 500 MG/50ML IV EMUL
INTRAVENOUS | Status: DC | PRN
  Administered 2016-09-21: 300 ug/kg/min via INTRAVENOUS

## 2016-09-21 MED ORDER — LACTATED RINGERS IV SOLN
INTRAVENOUS | Status: DC
  Administered 2016-09-21: 13:00:00 via INTRAVENOUS

## 2016-09-21 MED ORDER — LABETALOL HCL 5 MG/ML IV SOLN
INTRAVENOUS | Status: DC | PRN
Start: 2016-09-21 — End: 2016-09-21
  Administered 2016-09-21: 5 mg via INTRAVENOUS

## 2016-09-21 MED ORDER — PROPOFOL 10 MG/ML IV BOLUS
INTRAVENOUS | Status: AC
  Filled 2016-09-21: qty 40

## 2016-09-21 MED ORDER — LIDOCAINE 2% (20 MG/ML) 5 ML SYRINGE
INTRAMUSCULAR | Status: AC
  Filled 2016-09-21: qty 5

## 2016-09-21 MED ORDER — GLYCOPYRROLATE 0.2 MG/ML IJ SOLN
INTRAMUSCULAR | Status: DC | PRN
  Administered 2016-09-21: 0.2 mg via INTRAVENOUS

## 2016-09-21 MED ORDER — GLYCOPYRROLATE 0.2 MG/ML IV SOSY
PREFILLED_SYRINGE | INTRAVENOUS | Status: AC
  Filled 2016-09-21: qty 3

## 2016-09-21 MED ORDER — PROPOFOL 10 MG/ML IV BOLUS
INTRAVENOUS | Status: AC
Start: 2016-09-21 — End: 2016-09-21
  Filled 2016-09-21: qty 20

## 2016-09-21 MED ORDER — SODIUM CHLORIDE 0.9 % IV SOLN: INTRAVENOUS | Status: DC

## 2016-09-21 SURGICAL SUPPLY — 15 items

## 2016-09-21 NOTE — Anesthesia Preprocedure Evaluation (Signed)
Anesthesia Evaluation  Patient identified by MRN, date of birth, ID band Patient awake    Reviewed: Allergy & Precautions, H&P , NPO status , Patient's Chart, lab work & pertinent test results  Airway Mallampati: II  TM Distance: <3 FB Neck ROM: Full    Dental no notable dental hx. (+) Dental Advisory Given   Pulmonary neg pulmonary ROS,    Pulmonary exam normal breath sounds clear to auscultation       Cardiovascular hypertension, Pt. on medications Normal cardiovascular exam+ dysrhythmias  Rhythm:Regular Rate:Normal     Neuro/Psych CVA negative psych ROS   GI/Hepatic Neg liver ROS, GERD  ,  Endo/Other  negative endocrine ROS  Renal/GU negative Renal ROS     Musculoskeletal negative musculoskeletal ROS (+) Arthritis ,   Abdominal   Peds  Hematology negative hematology ROS (+)   Anesthesia Other Findings   Reproductive/Obstetrics negative OB ROS                             Anesthesia Physical  Anesthesia Plan  ASA: III  Anesthesia Plan: MAC   Post-op Pain Management:    Induction:   Airway Management Planned:   Additional Equipment:   Intra-op Plan:   Post-operative Plan:   Informed Consent: I have reviewed the patients History and Physical, chart, labs and discussed the procedure including the risks, benefits and alternatives for the proposed anesthesia with the patient or authorized representative who has indicated his/her understanding and acceptance.   Dental advisory given  Plan Discussed with: CRNA  Anesthesia Plan Comments:         Anesthesia Quick Evaluation

## 2016-09-21 NOTE — H&P (Signed)
Problem: Chronic intermittent solid food esophageal dysphagia.  History: The patient is a 70 year old female born 05/04/1946. On 11/14/2012 she underwent an emergency esophagogastroduodenoscopy due to a food bolus obstructing her distal esophagus. After relieving the obstructing food bolus, the esophagus, stomach, and duodenum appeared normal without the presence of an esophageal stricture.  The patient is scheduled to undergo repeat esophagogastroduodenoscopy with possible esophageal stricture dilation to evaluate chronic intermittent solid food esophageal dysphagia.  She stopped taking Plavix one week ago.  Past medical history: Hypertension. Left lateral medulla stroke. Cholecystectomy. Allergic rhinitis. 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 Total abdominal hysterectomy. Left lumbar laminectomy. Laparoscopic cholecystectomy. Bilateral knee replacement surgeries.  Allergies: Codeine, Zoloft, and Paxil cause nausea. Lisinopril causes cough. Omeprazole cause diarrhea.  Exam: Patient is alert and lying comfortably on the endoscopy stretcher. Abdomen is soft and nontender to palpation. Lungs are clear to auscultation. Cardiac exam reveals a regular rhythm.  Plan: Proceed with diagnostic esophagogastroduodenoscopy with possible esophageal stricture dilation and possible screen for eosinophilic esophagitis.

## 2016-09-21 NOTE — Discharge Instructions (Signed)

## 2016-09-21 NOTE — Transfer of Care (Signed)
Immediate Anesthesia Transfer of Care Note  Patient: Jordan Lane  Procedure(s) Performed: Procedure(s): ESOPHAGOGASTRODUODENOSCOPY (EGD) WITH PROPOFOL (N/A)  Patient Location: PACU  Anesthesia Type:MAC  Level of Consciousness: awake, alert , oriented and patient cooperative  Airway & Oxygen Therapy: Patient Spontanous Breathing and Patient connected to nasal cannula oxygen  Post-op Assessment: Report given to RN, Post -op Vital signs reviewed and stable and Patient moving all extremities X 4  Post vital signs: stable  Last Vitals:  Vitals:   09/21/16 1241  BP: (!) 159/77  Pulse: 71  Resp: 18  Temp: 36.7 C    Last Pain:  Vitals:   09/21/16 1241  TempSrc: Oral         Complications: No apparent anesthesia complications

## 2016-09-21 NOTE — Op Note (Signed)
[facility] Patient Name: Jordan Lane Procedure Date: 09/21/2016 MRN: [id number] Attending MD: Garlan Fair , MD Date of Birth: 09/16/1946 CSN: [id number] Age: 70 Admit Type: Outpatient Procedure:                Upper GI endoscopy Indications:              Dysphagia Providers:                Garlan Fair, MD, Cleda Daub, RN, Cherylynn Ridges, Technician, Enrigue Catena, CRNA Referring MD:              Medicines:                Propofol per Anesthesia Complications:            No immediate complications. Estimated Blood Loss:     Estimated blood loss: none. Procedure:                Pre-Anesthesia Assessment:                           - Prior to the procedure, a History and Physical                            was performed, and patient medications and                            allergies were reviewed. The patient's tolerance of                            previous anesthesia was also reviewed. The risks                            and benefits of the procedure and the sedation                            options and risks were discussed with the patient.                            All questions were answered, and informed consent                            was obtained. Prior Anticoagulants: The patient has                            taken Plavix (clopidogrel), last dose was 7 days                            prior to procedure. ASA Grade Assessment: II - A                            patient with mild systemic disease. After reviewing  the risks and benefits, the patient was deemed in                            satisfactory condition to undergo the procedure.                           After obtaining informed consent, the endoscope was                            passed under direct vision. Throughout the                            procedure, the patient's blood pressure, pulse, and   oxygen saturations were monitored continuously. The                            [id number] scope was introduced through the                            mouth, and advanced to the second part of duodenum.                            The upper GI endoscopy was accomplished without                            difficulty. The patient tolerated the procedure                            well. Scope In: Scope Out: Findings:      The Z-line was irregular and was found 39 cm from the incisors.      The examined esophagus was normal. Biopsies were taken with a cold       forceps for histology to look for eosinophilic esophagitis.      The entire examined stomach was normal.      The examined duodenum was normal. Impression:               - Z-line irregular, 39 cm from the incisors.                           - Normal esophagus. Biopsied.                           - Normal stomach.                           - Normal examined duodenum. Moderate Sedation:      N/A- Per Anesthesia Care Recommendation:           - Patient has a contact number available for                            emergencies. The signs and symptoms of potential                            delayed complications were discussed with the  patient. Return to normal activities tomorrow.                            Written discharge instructions were provided to the                            patient.                           - Await pathology results R/O eosinophilic                            esophagitis.                           - Resume previous diet.                           - Continue present medications. Procedure Code(s):        --- Professional ---                           [id number], Esophagogastroduodenoscopy, flexible,                            transoral; with biopsy, single or multiple Diagnosis Code(s):        --- Professional ---                           K22.8, Other specified diseases of  esophagus                           R13.10, Dysphagia, unspecified CPT copyright 2016 American [employer]. All rights reserved. The codes documented in this report are preliminary and upon coder review may  be revised to meet current compliance requirements. Earle Gell, MD Garlan Fair, MD 09/21/2016 1:25:47 PM This report has been signed electronically. Number of Addenda: 0

## 2016-09-22 NOTE — Anesthesia Postprocedure Evaluation (Signed)
Anesthesia Post Note  Patient: Jordan Lane  Procedure(s) Performed: Procedure(s) (LRB): ESOPHAGOGASTRODUODENOSCOPY (EGD) WITH PROPOFOL (N/A)  Patient location during evaluation: PACU Anesthesia Type: MAC Level of consciousness: awake and alert Pain management: pain level controlled Vital Signs Assessment: post-procedure vital signs reviewed and stable Respiratory status: spontaneous breathing Cardiovascular status: stable Anesthetic complications: no     Last Vitals:  Vitals:   09/21/16 1350 09/21/16 1400  BP: 125/61 137/63  Pulse: 73 72  Resp: 14 (!) 23  Temp: 36.4 C     Last Pain:  Vitals:   09/21/16 1350  TempSrc: Axillary   Pain Goal:                 Nolon Nations

## 2016-09-23 ENCOUNTER — Encounter (HOSPITAL_COMMUNITY): Payer: Self-pay | Admitting: Gastroenterology

## 2016-10-05 DIAGNOSIS — H5213 Myopia, bilateral: Secondary | ICD-10-CM | POA: Diagnosis not present

## 2016-11-02 DIAGNOSIS — R7301 Impaired fasting glucose: Secondary | ICD-10-CM | POA: Diagnosis not present

## 2016-11-02 DIAGNOSIS — I6930 Unspecified sequelae of cerebral infarction: Secondary | ICD-10-CM | POA: Diagnosis not present

## 2016-11-02 DIAGNOSIS — Z0000 Encounter for general adult medical examination without abnormal findings: Secondary | ICD-10-CM | POA: Diagnosis not present

## 2016-11-02 DIAGNOSIS — Z1389 Encounter for screening for other disorder: Secondary | ICD-10-CM | POA: Diagnosis not present

## 2016-11-02 DIAGNOSIS — Z13820 Encounter for screening for osteoporosis: Secondary | ICD-10-CM | POA: Diagnosis not present

## 2016-11-02 DIAGNOSIS — F329 Major depressive disorder, single episode, unspecified: Secondary | ICD-10-CM | POA: Diagnosis not present

## 2016-11-02 DIAGNOSIS — K219 Gastro-esophageal reflux disease without esophagitis: Secondary | ICD-10-CM | POA: Diagnosis not present

## 2016-11-02 DIAGNOSIS — I10 Essential (primary) hypertension: Secondary | ICD-10-CM | POA: Diagnosis not present

## 2016-11-02 DIAGNOSIS — J209 Acute bronchitis, unspecified: Secondary | ICD-10-CM | POA: Diagnosis not present

## 2016-11-30 DIAGNOSIS — S4290XA Fracture of unspecified shoulder girdle, part unspecified, initial encounter for closed fracture: Secondary | ICD-10-CM

## 2016-11-30 HISTORY — DX: Fracture of unspecified shoulder girdle, part unspecified, initial encounter for closed fracture: S42.90XA

## 2016-12-22 DIAGNOSIS — Z1231 Encounter for screening mammogram for malignant neoplasm of breast: Secondary | ICD-10-CM | POA: Diagnosis not present

## 2016-12-22 DIAGNOSIS — Z803 Family history of malignant neoplasm of breast: Secondary | ICD-10-CM | POA: Diagnosis not present

## 2017-01-27 DIAGNOSIS — L821 Other seborrheic keratosis: Secondary | ICD-10-CM | POA: Diagnosis not present

## 2017-01-27 DIAGNOSIS — D485 Neoplasm of uncertain behavior of skin: Secondary | ICD-10-CM | POA: Diagnosis not present

## 2017-01-27 DIAGNOSIS — Z85828 Personal history of other malignant neoplasm of skin: Secondary | ICD-10-CM | POA: Diagnosis not present

## 2017-01-27 DIAGNOSIS — D2262 Melanocytic nevi of left upper limb, including shoulder: Secondary | ICD-10-CM | POA: Diagnosis not present

## 2017-01-27 DIAGNOSIS — D225 Melanocytic nevi of trunk: Secondary | ICD-10-CM | POA: Diagnosis not present

## 2017-01-27 DIAGNOSIS — D2271 Melanocytic nevi of right lower limb, including hip: Secondary | ICD-10-CM | POA: Diagnosis not present

## 2017-03-31 ENCOUNTER — Ambulatory Visit: Payer: Commercial Managed Care - HMO | Admitting: Neurology

## 2017-05-19 DIAGNOSIS — K219 Gastro-esophageal reflux disease without esophagitis: Secondary | ICD-10-CM | POA: Diagnosis not present

## 2017-05-19 DIAGNOSIS — R1310 Dysphagia, unspecified: Secondary | ICD-10-CM | POA: Diagnosis not present

## 2017-05-19 DIAGNOSIS — I10 Essential (primary) hypertension: Secondary | ICD-10-CM | POA: Diagnosis not present

## 2017-05-27 DIAGNOSIS — Z96652 Presence of left artificial knee joint: Secondary | ICD-10-CM | POA: Diagnosis not present

## 2017-05-27 DIAGNOSIS — Z96651 Presence of right artificial knee joint: Secondary | ICD-10-CM | POA: Diagnosis not present

## 2017-05-27 DIAGNOSIS — M25562 Pain in left knee: Secondary | ICD-10-CM | POA: Diagnosis not present

## 2017-05-27 DIAGNOSIS — M25561 Pain in right knee: Secondary | ICD-10-CM | POA: Diagnosis not present

## 2017-08-27 DIAGNOSIS — M179 Osteoarthritis of knee, unspecified: Secondary | ICD-10-CM | POA: Diagnosis not present

## 2017-08-27 DIAGNOSIS — E781 Pure hyperglyceridemia: Secondary | ICD-10-CM | POA: Diagnosis not present

## 2017-08-27 DIAGNOSIS — I10 Essential (primary) hypertension: Secondary | ICD-10-CM | POA: Diagnosis not present

## 2017-08-27 DIAGNOSIS — F329 Major depressive disorder, single episode, unspecified: Secondary | ICD-10-CM | POA: Diagnosis not present

## 2017-09-20 DIAGNOSIS — H6123 Impacted cerumen, bilateral: Secondary | ICD-10-CM | POA: Diagnosis not present

## 2017-10-13 DIAGNOSIS — H524 Presbyopia: Secondary | ICD-10-CM | POA: Diagnosis not present

## 2017-10-13 DIAGNOSIS — H5213 Myopia, bilateral: Secondary | ICD-10-CM | POA: Diagnosis not present

## 2017-10-13 DIAGNOSIS — H52203 Unspecified astigmatism, bilateral: Secondary | ICD-10-CM | POA: Diagnosis not present

## 2017-10-25 DIAGNOSIS — M25551 Pain in right hip: Secondary | ICD-10-CM | POA: Diagnosis not present

## 2017-11-02 DIAGNOSIS — R7301 Impaired fasting glucose: Secondary | ICD-10-CM | POA: Diagnosis not present

## 2017-11-02 DIAGNOSIS — Z0000 Encounter for general adult medical examination without abnormal findings: Secondary | ICD-10-CM | POA: Diagnosis not present

## 2017-11-02 DIAGNOSIS — E669 Obesity, unspecified: Secondary | ICD-10-CM | POA: Diagnosis not present

## 2017-11-02 DIAGNOSIS — Z1389 Encounter for screening for other disorder: Secondary | ICD-10-CM | POA: Diagnosis not present

## 2017-11-02 DIAGNOSIS — E781 Pure hyperglyceridemia: Secondary | ICD-10-CM | POA: Diagnosis not present

## 2017-11-02 DIAGNOSIS — Z6832 Body mass index (BMI) 32.0-32.9, adult: Secondary | ICD-10-CM | POA: Diagnosis not present

## 2017-11-02 DIAGNOSIS — I10 Essential (primary) hypertension: Secondary | ICD-10-CM | POA: Diagnosis not present

## 2017-11-02 DIAGNOSIS — M8580 Other specified disorders of bone density and structure, unspecified site: Secondary | ICD-10-CM | POA: Diagnosis not present

## 2017-11-02 DIAGNOSIS — K219 Gastro-esophageal reflux disease without esophagitis: Secondary | ICD-10-CM | POA: Diagnosis not present

## 2017-11-03 DIAGNOSIS — M1611 Unilateral primary osteoarthritis, right hip: Secondary | ICD-10-CM | POA: Diagnosis not present

## 2017-12-02 DIAGNOSIS — M25559 Pain in unspecified hip: Secondary | ICD-10-CM | POA: Diagnosis not present

## 2017-12-02 DIAGNOSIS — M1611 Unilateral primary osteoarthritis, right hip: Secondary | ICD-10-CM | POA: Diagnosis not present

## 2017-12-02 DIAGNOSIS — M25561 Pain in right knee: Secondary | ICD-10-CM | POA: Diagnosis not present

## 2017-12-02 DIAGNOSIS — Z96653 Presence of artificial knee joint, bilateral: Secondary | ICD-10-CM | POA: Diagnosis not present

## 2017-12-02 DIAGNOSIS — M25562 Pain in left knee: Secondary | ICD-10-CM | POA: Diagnosis not present

## 2017-12-02 DIAGNOSIS — M25551 Pain in right hip: Secondary | ICD-10-CM | POA: Diagnosis not present

## 2017-12-10 DIAGNOSIS — M25551 Pain in right hip: Secondary | ICD-10-CM | POA: Diagnosis not present

## 2017-12-10 DIAGNOSIS — M25559 Pain in unspecified hip: Secondary | ICD-10-CM | POA: Diagnosis not present

## 2017-12-23 DIAGNOSIS — Z1231 Encounter for screening mammogram for malignant neoplasm of breast: Secondary | ICD-10-CM | POA: Diagnosis not present

## 2017-12-30 DIAGNOSIS — M179 Osteoarthritis of knee, unspecified: Secondary | ICD-10-CM | POA: Diagnosis not present

## 2017-12-30 DIAGNOSIS — I10 Essential (primary) hypertension: Secondary | ICD-10-CM | POA: Diagnosis not present

## 2017-12-30 DIAGNOSIS — E781 Pure hyperglyceridemia: Secondary | ICD-10-CM | POA: Diagnosis not present

## 2018-01-11 DIAGNOSIS — R229 Localized swelling, mass and lump, unspecified: Secondary | ICD-10-CM | POA: Diagnosis not present

## 2018-02-16 DIAGNOSIS — M25551 Pain in right hip: Secondary | ICD-10-CM | POA: Diagnosis not present

## 2018-02-16 DIAGNOSIS — M25559 Pain in unspecified hip: Secondary | ICD-10-CM | POA: Diagnosis not present

## 2018-02-16 DIAGNOSIS — M1611 Unilateral primary osteoarthritis, right hip: Secondary | ICD-10-CM | POA: Diagnosis not present

## 2018-03-28 DIAGNOSIS — Z85828 Personal history of other malignant neoplasm of skin: Secondary | ICD-10-CM | POA: Diagnosis not present

## 2018-03-28 DIAGNOSIS — L821 Other seborrheic keratosis: Secondary | ICD-10-CM | POA: Diagnosis not present

## 2018-03-28 DIAGNOSIS — D225 Melanocytic nevi of trunk: Secondary | ICD-10-CM | POA: Diagnosis not present

## 2018-03-28 DIAGNOSIS — D170 Benign lipomatous neoplasm of skin and subcutaneous tissue of head, face and neck: Secondary | ICD-10-CM | POA: Diagnosis not present

## 2018-05-02 DIAGNOSIS — Z1211 Encounter for screening for malignant neoplasm of colon: Secondary | ICD-10-CM | POA: Diagnosis not present

## 2018-05-02 DIAGNOSIS — I10 Essential (primary) hypertension: Secondary | ICD-10-CM | POA: Diagnosis not present

## 2018-05-02 DIAGNOSIS — F419 Anxiety disorder, unspecified: Secondary | ICD-10-CM | POA: Diagnosis not present

## 2018-06-28 DIAGNOSIS — Z1211 Encounter for screening for malignant neoplasm of colon: Secondary | ICD-10-CM | POA: Diagnosis not present

## 2018-06-28 DIAGNOSIS — Z9889 Other specified postprocedural states: Secondary | ICD-10-CM | POA: Diagnosis not present

## 2018-06-28 DIAGNOSIS — R1310 Dysphagia, unspecified: Secondary | ICD-10-CM | POA: Diagnosis not present

## 2018-06-28 DIAGNOSIS — R197 Diarrhea, unspecified: Secondary | ICD-10-CM | POA: Diagnosis not present

## 2018-06-28 DIAGNOSIS — K580 Irritable bowel syndrome with diarrhea: Secondary | ICD-10-CM | POA: Diagnosis not present

## 2018-08-04 DIAGNOSIS — M1712 Unilateral primary osteoarthritis, left knee: Secondary | ICD-10-CM | POA: Diagnosis not present

## 2018-08-04 DIAGNOSIS — M1711 Unilateral primary osteoarthritis, right knee: Secondary | ICD-10-CM | POA: Diagnosis not present

## 2018-08-04 DIAGNOSIS — M25562 Pain in left knee: Secondary | ICD-10-CM | POA: Diagnosis not present

## 2018-08-04 DIAGNOSIS — M25561 Pain in right knee: Secondary | ICD-10-CM | POA: Diagnosis not present

## 2018-10-17 DIAGNOSIS — H524 Presbyopia: Secondary | ICD-10-CM | POA: Diagnosis not present

## 2018-10-17 DIAGNOSIS — H5213 Myopia, bilateral: Secondary | ICD-10-CM | POA: Diagnosis not present

## 2018-10-17 DIAGNOSIS — H52203 Unspecified astigmatism, bilateral: Secondary | ICD-10-CM | POA: Diagnosis not present

## 2018-10-17 DIAGNOSIS — H2513 Age-related nuclear cataract, bilateral: Secondary | ICD-10-CM | POA: Diagnosis not present

## 2018-10-17 DIAGNOSIS — H25013 Cortical age-related cataract, bilateral: Secondary | ICD-10-CM | POA: Diagnosis not present

## 2018-11-14 DIAGNOSIS — E781 Pure hyperglyceridemia: Secondary | ICD-10-CM | POA: Diagnosis not present

## 2018-11-14 DIAGNOSIS — F5104 Psychophysiologic insomnia: Secondary | ICD-10-CM | POA: Diagnosis not present

## 2018-11-14 DIAGNOSIS — R7301 Impaired fasting glucose: Secondary | ICD-10-CM | POA: Diagnosis not present

## 2018-11-14 DIAGNOSIS — M8580 Other specified disorders of bone density and structure, unspecified site: Secondary | ICD-10-CM | POA: Diagnosis not present

## 2018-11-14 DIAGNOSIS — Z1389 Encounter for screening for other disorder: Secondary | ICD-10-CM | POA: Diagnosis not present

## 2018-11-14 DIAGNOSIS — K219 Gastro-esophageal reflux disease without esophagitis: Secondary | ICD-10-CM | POA: Diagnosis not present

## 2018-11-14 DIAGNOSIS — E669 Obesity, unspecified: Secondary | ICD-10-CM | POA: Diagnosis not present

## 2018-11-14 DIAGNOSIS — Z0000 Encounter for general adult medical examination without abnormal findings: Secondary | ICD-10-CM | POA: Diagnosis not present

## 2018-11-14 DIAGNOSIS — Z6832 Body mass index (BMI) 32.0-32.9, adult: Secondary | ICD-10-CM | POA: Diagnosis not present

## 2018-11-14 DIAGNOSIS — I10 Essential (primary) hypertension: Secondary | ICD-10-CM | POA: Diagnosis not present

## 2018-11-21 DIAGNOSIS — H6123 Impacted cerumen, bilateral: Secondary | ICD-10-CM | POA: Diagnosis not present

## 2018-11-30 DIAGNOSIS — R195 Other fecal abnormalities: Secondary | ICD-10-CM

## 2018-11-30 HISTORY — DX: Other fecal abnormalities: R19.5

## 2018-12-25 DIAGNOSIS — Z1211 Encounter for screening for malignant neoplasm of colon: Secondary | ICD-10-CM | POA: Diagnosis not present

## 2018-12-25 DIAGNOSIS — Z1212 Encounter for screening for malignant neoplasm of rectum: Secondary | ICD-10-CM | POA: Diagnosis not present

## 2018-12-26 DIAGNOSIS — M8589 Other specified disorders of bone density and structure, multiple sites: Secondary | ICD-10-CM | POA: Diagnosis not present

## 2018-12-26 DIAGNOSIS — Z780 Asymptomatic menopausal state: Secondary | ICD-10-CM | POA: Diagnosis not present

## 2018-12-26 DIAGNOSIS — Z1231 Encounter for screening mammogram for malignant neoplasm of breast: Secondary | ICD-10-CM | POA: Diagnosis not present

## 2018-12-26 DIAGNOSIS — Z8262 Family history of osteoporosis: Secondary | ICD-10-CM | POA: Diagnosis not present

## 2018-12-26 DIAGNOSIS — Z803 Family history of malignant neoplasm of breast: Secondary | ICD-10-CM | POA: Diagnosis not present

## 2018-12-30 DIAGNOSIS — M25561 Pain in right knee: Secondary | ICD-10-CM | POA: Diagnosis not present

## 2018-12-30 DIAGNOSIS — Z96653 Presence of artificial knee joint, bilateral: Secondary | ICD-10-CM | POA: Diagnosis not present

## 2018-12-30 DIAGNOSIS — Z471 Aftercare following joint replacement surgery: Secondary | ICD-10-CM | POA: Diagnosis not present

## 2018-12-30 DIAGNOSIS — M25562 Pain in left knee: Secondary | ICD-10-CM | POA: Diagnosis not present

## 2019-01-10 ENCOUNTER — Other Ambulatory Visit: Payer: Self-pay | Admitting: Gastroenterology

## 2019-01-10 DIAGNOSIS — R195 Other fecal abnormalities: Secondary | ICD-10-CM | POA: Diagnosis not present

## 2019-01-10 DIAGNOSIS — R1310 Dysphagia, unspecified: Secondary | ICD-10-CM

## 2019-01-10 DIAGNOSIS — R1319 Other dysphagia: Secondary | ICD-10-CM

## 2019-01-12 ENCOUNTER — Ambulatory Visit
Admission: RE | Admit: 2019-01-12 | Discharge: 2019-01-12 | Disposition: A | Discharge status: Home / Self Care | Payer: Commercial Managed Care - HMO | Source: Ambulatory Visit | Attending: Gastroenterology | Admitting: Gastroenterology

## 2019-01-12 DIAGNOSIS — R1310 Dysphagia, unspecified: Secondary | ICD-10-CM

## 2019-01-12 DIAGNOSIS — K219 Gastro-esophageal reflux disease without esophagitis: Secondary | ICD-10-CM | POA: Diagnosis not present

## 2019-01-12 DIAGNOSIS — R1319 Other dysphagia: Secondary | ICD-10-CM

## 2019-01-16 DIAGNOSIS — R195 Other fecal abnormalities: Secondary | ICD-10-CM | POA: Diagnosis not present

## 2019-01-16 DIAGNOSIS — R933 Abnormal findings on diagnostic imaging of other parts of digestive tract: Secondary | ICD-10-CM | POA: Diagnosis not present

## 2019-01-16 DIAGNOSIS — K222 Esophageal obstruction: Secondary | ICD-10-CM | POA: Diagnosis not present

## 2019-01-16 DIAGNOSIS — R1314 Dysphagia, pharyngoesophageal phase: Secondary | ICD-10-CM | POA: Diagnosis not present

## 2019-01-16 DIAGNOSIS — D126 Benign neoplasm of colon, unspecified: Secondary | ICD-10-CM | POA: Diagnosis not present

## 2019-01-18 DIAGNOSIS — D126 Benign neoplasm of colon, unspecified: Secondary | ICD-10-CM | POA: Diagnosis not present

## 2019-02-08 DIAGNOSIS — M25551 Pain in right hip: Secondary | ICD-10-CM | POA: Diagnosis not present

## 2019-03-27 DIAGNOSIS — M179 Osteoarthritis of knee, unspecified: Secondary | ICD-10-CM | POA: Diagnosis not present

## 2019-03-27 DIAGNOSIS — I10 Essential (primary) hypertension: Secondary | ICD-10-CM | POA: Diagnosis not present

## 2019-03-27 DIAGNOSIS — M8580 Other specified disorders of bone density and structure, unspecified site: Secondary | ICD-10-CM | POA: Diagnosis not present

## 2019-03-27 DIAGNOSIS — E781 Pure hyperglyceridemia: Secondary | ICD-10-CM | POA: Diagnosis not present

## 2019-05-22 DIAGNOSIS — L918 Other hypertrophic disorders of the skin: Secondary | ICD-10-CM | POA: Diagnosis not present

## 2019-05-22 DIAGNOSIS — D692 Other nonthrombocytopenic purpura: Secondary | ICD-10-CM | POA: Diagnosis not present

## 2019-05-22 DIAGNOSIS — L570 Actinic keratosis: Secondary | ICD-10-CM | POA: Diagnosis not present

## 2019-05-22 DIAGNOSIS — L821 Other seborrheic keratosis: Secondary | ICD-10-CM | POA: Diagnosis not present

## 2019-05-22 DIAGNOSIS — D170 Benign lipomatous neoplasm of skin and subcutaneous tissue of head, face and neck: Secondary | ICD-10-CM | POA: Diagnosis not present

## 2019-05-22 DIAGNOSIS — D225 Melanocytic nevi of trunk: Secondary | ICD-10-CM | POA: Diagnosis not present

## 2019-05-22 DIAGNOSIS — D2271 Melanocytic nevi of right lower limb, including hip: Secondary | ICD-10-CM | POA: Diagnosis not present

## 2019-05-22 DIAGNOSIS — Z85828 Personal history of other malignant neoplasm of skin: Secondary | ICD-10-CM | POA: Diagnosis not present

## 2019-05-23 DIAGNOSIS — I10 Essential (primary) hypertension: Secondary | ICD-10-CM | POA: Diagnosis not present

## 2019-05-23 DIAGNOSIS — K219 Gastro-esophageal reflux disease without esophagitis: Secondary | ICD-10-CM | POA: Diagnosis not present

## 2019-06-20 DIAGNOSIS — M8580 Other specified disorders of bone density and structure, unspecified site: Secondary | ICD-10-CM | POA: Diagnosis not present

## 2019-06-20 DIAGNOSIS — I10 Essential (primary) hypertension: Secondary | ICD-10-CM | POA: Diagnosis not present

## 2019-06-20 DIAGNOSIS — M179 Osteoarthritis of knee, unspecified: Secondary | ICD-10-CM | POA: Diagnosis not present

## 2019-06-20 DIAGNOSIS — E781 Pure hyperglyceridemia: Secondary | ICD-10-CM | POA: Diagnosis not present

## 2019-07-27 DIAGNOSIS — E781 Pure hyperglyceridemia: Secondary | ICD-10-CM | POA: Diagnosis not present

## 2019-07-27 DIAGNOSIS — I10 Essential (primary) hypertension: Secondary | ICD-10-CM | POA: Diagnosis not present

## 2019-07-27 DIAGNOSIS — M179 Osteoarthritis of knee, unspecified: Secondary | ICD-10-CM | POA: Diagnosis not present

## 2019-07-27 DIAGNOSIS — M8580 Other specified disorders of bone density and structure, unspecified site: Secondary | ICD-10-CM | POA: Diagnosis not present

## 2019-08-21 DIAGNOSIS — F419 Anxiety disorder, unspecified: Secondary | ICD-10-CM | POA: Diagnosis not present

## 2019-09-14 DIAGNOSIS — E781 Pure hyperglyceridemia: Secondary | ICD-10-CM | POA: Diagnosis not present

## 2019-09-14 DIAGNOSIS — I10 Essential (primary) hypertension: Secondary | ICD-10-CM | POA: Diagnosis not present

## 2019-09-14 DIAGNOSIS — M8580 Other specified disorders of bone density and structure, unspecified site: Secondary | ICD-10-CM | POA: Diagnosis not present

## 2019-09-14 DIAGNOSIS — M179 Osteoarthritis of knee, unspecified: Secondary | ICD-10-CM | POA: Diagnosis not present

## 2019-12-01 HISTORY — PX: CATARACT EXTRACTION, BILATERAL: SHX1313

## 2019-12-07 DIAGNOSIS — Z0000 Encounter for general adult medical examination without abnormal findings: Secondary | ICD-10-CM | POA: Diagnosis not present

## 2019-12-07 DIAGNOSIS — R1310 Dysphagia, unspecified: Secondary | ICD-10-CM | POA: Diagnosis not present

## 2019-12-07 DIAGNOSIS — K219 Gastro-esophageal reflux disease without esophagitis: Secondary | ICD-10-CM | POA: Diagnosis not present

## 2019-12-07 DIAGNOSIS — R7301 Impaired fasting glucose: Secondary | ICD-10-CM | POA: Diagnosis not present

## 2019-12-07 DIAGNOSIS — Z1389 Encounter for screening for other disorder: Secondary | ICD-10-CM | POA: Diagnosis not present

## 2019-12-07 DIAGNOSIS — E782 Mixed hyperlipidemia: Secondary | ICD-10-CM | POA: Diagnosis not present

## 2019-12-07 DIAGNOSIS — I10 Essential (primary) hypertension: Secondary | ICD-10-CM | POA: Diagnosis not present

## 2019-12-07 DIAGNOSIS — F419 Anxiety disorder, unspecified: Secondary | ICD-10-CM | POA: Diagnosis not present

## 2019-12-07 DIAGNOSIS — H90A11 Conductive hearing loss, unilateral, right ear with restricted hearing on the contralateral side: Secondary | ICD-10-CM | POA: Diagnosis not present

## 2019-12-30 ENCOUNTER — Ambulatory Visit: Payer: Medicare HMO

## 2020-01-01 DIAGNOSIS — Z1231 Encounter for screening mammogram for malignant neoplasm of breast: Secondary | ICD-10-CM | POA: Diagnosis not present

## 2020-01-05 ENCOUNTER — Ambulatory Visit: Payer: Medicare HMO | Attending: Internal Medicine

## 2020-01-05 DIAGNOSIS — N6002 Solitary cyst of left breast: Secondary | ICD-10-CM | POA: Diagnosis not present

## 2020-01-10 DIAGNOSIS — M179 Osteoarthritis of knee, unspecified: Secondary | ICD-10-CM | POA: Diagnosis not present

## 2020-01-10 DIAGNOSIS — M8580 Other specified disorders of bone density and structure, unspecified site: Secondary | ICD-10-CM | POA: Diagnosis not present

## 2020-01-10 DIAGNOSIS — E781 Pure hyperglyceridemia: Secondary | ICD-10-CM | POA: Diagnosis not present

## 2020-01-10 DIAGNOSIS — E782 Mixed hyperlipidemia: Secondary | ICD-10-CM | POA: Diagnosis not present

## 2020-01-10 DIAGNOSIS — I10 Essential (primary) hypertension: Secondary | ICD-10-CM | POA: Diagnosis not present

## 2020-01-20 ENCOUNTER — Ambulatory Visit: Payer: Medicare HMO

## 2020-02-14 DIAGNOSIS — H52203 Unspecified astigmatism, bilateral: Secondary | ICD-10-CM | POA: Diagnosis not present

## 2020-02-14 DIAGNOSIS — H26103 Unspecified traumatic cataract, bilateral: Secondary | ICD-10-CM | POA: Diagnosis not present

## 2020-02-14 DIAGNOSIS — H25013 Cortical age-related cataract, bilateral: Secondary | ICD-10-CM | POA: Diagnosis not present

## 2020-02-14 DIAGNOSIS — H18513 Endothelial corneal dystrophy, bilateral: Secondary | ICD-10-CM | POA: Diagnosis not present

## 2020-03-26 DIAGNOSIS — H25012 Cortical age-related cataract, left eye: Secondary | ICD-10-CM | POA: Diagnosis not present

## 2020-03-26 DIAGNOSIS — H25812 Combined forms of age-related cataract, left eye: Secondary | ICD-10-CM | POA: Diagnosis not present

## 2020-03-26 DIAGNOSIS — H2512 Age-related nuclear cataract, left eye: Secondary | ICD-10-CM | POA: Diagnosis not present

## 2020-04-26 DIAGNOSIS — M179 Osteoarthritis of knee, unspecified: Secondary | ICD-10-CM | POA: Diagnosis not present

## 2020-04-26 DIAGNOSIS — M8580 Other specified disorders of bone density and structure, unspecified site: Secondary | ICD-10-CM | POA: Diagnosis not present

## 2020-04-26 DIAGNOSIS — E782 Mixed hyperlipidemia: Secondary | ICD-10-CM | POA: Diagnosis not present

## 2020-04-26 DIAGNOSIS — E781 Pure hyperglyceridemia: Secondary | ICD-10-CM | POA: Diagnosis not present

## 2020-04-26 DIAGNOSIS — I10 Essential (primary) hypertension: Secondary | ICD-10-CM | POA: Diagnosis not present

## 2020-04-30 DIAGNOSIS — H25811 Combined forms of age-related cataract, right eye: Secondary | ICD-10-CM | POA: Diagnosis not present

## 2020-04-30 DIAGNOSIS — H25011 Cortical age-related cataract, right eye: Secondary | ICD-10-CM | POA: Diagnosis not present

## 2020-04-30 DIAGNOSIS — H2511 Age-related nuclear cataract, right eye: Secondary | ICD-10-CM | POA: Diagnosis not present

## 2020-06-06 DIAGNOSIS — I10 Essential (primary) hypertension: Secondary | ICD-10-CM | POA: Diagnosis not present

## 2020-06-06 DIAGNOSIS — K219 Gastro-esophageal reflux disease without esophagitis: Secondary | ICD-10-CM | POA: Diagnosis not present

## 2020-06-06 DIAGNOSIS — F419 Anxiety disorder, unspecified: Secondary | ICD-10-CM | POA: Diagnosis not present

## 2020-06-06 DIAGNOSIS — F5104 Psychophysiologic insomnia: Secondary | ICD-10-CM | POA: Diagnosis not present

## 2020-06-07 DIAGNOSIS — M1712 Unilateral primary osteoarthritis, left knee: Secondary | ICD-10-CM | POA: Diagnosis not present

## 2020-06-07 DIAGNOSIS — M1711 Unilateral primary osteoarthritis, right knee: Secondary | ICD-10-CM | POA: Diagnosis not present

## 2020-06-07 DIAGNOSIS — M170 Bilateral primary osteoarthritis of knee: Secondary | ICD-10-CM | POA: Diagnosis not present

## 2020-06-07 DIAGNOSIS — M25562 Pain in left knee: Secondary | ICD-10-CM | POA: Diagnosis not present

## 2020-06-07 DIAGNOSIS — M25561 Pain in right knee: Secondary | ICD-10-CM | POA: Diagnosis not present

## 2020-06-07 DIAGNOSIS — Z96653 Presence of artificial knee joint, bilateral: Secondary | ICD-10-CM | POA: Diagnosis not present

## 2020-06-24 DIAGNOSIS — L570 Actinic keratosis: Secondary | ICD-10-CM | POA: Diagnosis not present

## 2020-06-24 DIAGNOSIS — L84 Corns and callosities: Secondary | ICD-10-CM | POA: Diagnosis not present

## 2020-06-24 DIAGNOSIS — D170 Benign lipomatous neoplasm of skin and subcutaneous tissue of head, face and neck: Secondary | ICD-10-CM | POA: Diagnosis not present

## 2020-06-24 DIAGNOSIS — L821 Other seborrheic keratosis: Secondary | ICD-10-CM | POA: Diagnosis not present

## 2020-06-24 DIAGNOSIS — Z85828 Personal history of other malignant neoplasm of skin: Secondary | ICD-10-CM | POA: Diagnosis not present

## 2020-06-24 DIAGNOSIS — L918 Other hypertrophic disorders of the skin: Secondary | ICD-10-CM | POA: Diagnosis not present

## 2020-07-30 DIAGNOSIS — M8580 Other specified disorders of bone density and structure, unspecified site: Secondary | ICD-10-CM | POA: Diagnosis not present

## 2020-07-30 DIAGNOSIS — E781 Pure hyperglyceridemia: Secondary | ICD-10-CM | POA: Diagnosis not present

## 2020-07-30 DIAGNOSIS — M179 Osteoarthritis of knee, unspecified: Secondary | ICD-10-CM | POA: Diagnosis not present

## 2020-07-30 DIAGNOSIS — E782 Mixed hyperlipidemia: Secondary | ICD-10-CM | POA: Diagnosis not present

## 2020-07-30 DIAGNOSIS — I10 Essential (primary) hypertension: Secondary | ICD-10-CM | POA: Diagnosis not present

## 2020-09-20 DIAGNOSIS — M179 Osteoarthritis of knee, unspecified: Secondary | ICD-10-CM | POA: Diagnosis not present

## 2020-09-20 DIAGNOSIS — I10 Essential (primary) hypertension: Secondary | ICD-10-CM | POA: Diagnosis not present

## 2020-09-20 DIAGNOSIS — E781 Pure hyperglyceridemia: Secondary | ICD-10-CM | POA: Diagnosis not present

## 2020-09-20 DIAGNOSIS — E782 Mixed hyperlipidemia: Secondary | ICD-10-CM | POA: Diagnosis not present

## 2020-09-20 DIAGNOSIS — M8580 Other specified disorders of bone density and structure, unspecified site: Secondary | ICD-10-CM | POA: Diagnosis not present

## 2020-11-27 DIAGNOSIS — E781 Pure hyperglyceridemia: Secondary | ICD-10-CM | POA: Diagnosis not present

## 2020-11-27 DIAGNOSIS — E782 Mixed hyperlipidemia: Secondary | ICD-10-CM | POA: Diagnosis not present

## 2020-11-27 DIAGNOSIS — M8580 Other specified disorders of bone density and structure, unspecified site: Secondary | ICD-10-CM | POA: Diagnosis not present

## 2020-11-27 DIAGNOSIS — K219 Gastro-esophageal reflux disease without esophagitis: Secondary | ICD-10-CM | POA: Diagnosis not present

## 2020-11-27 DIAGNOSIS — M179 Osteoarthritis of knee, unspecified: Secondary | ICD-10-CM | POA: Diagnosis not present

## 2020-11-27 DIAGNOSIS — I10 Essential (primary) hypertension: Secondary | ICD-10-CM | POA: Diagnosis not present

## 2020-12-12 DIAGNOSIS — F419 Anxiety disorder, unspecified: Secondary | ICD-10-CM | POA: Diagnosis not present

## 2020-12-12 DIAGNOSIS — Z1159 Encounter for screening for other viral diseases: Secondary | ICD-10-CM | POA: Diagnosis not present

## 2020-12-12 DIAGNOSIS — E782 Mixed hyperlipidemia: Secondary | ICD-10-CM | POA: Diagnosis not present

## 2020-12-12 DIAGNOSIS — K219 Gastro-esophageal reflux disease without esophagitis: Secondary | ICD-10-CM | POA: Diagnosis not present

## 2020-12-12 DIAGNOSIS — R7301 Impaired fasting glucose: Secondary | ICD-10-CM | POA: Diagnosis not present

## 2020-12-12 DIAGNOSIS — I10 Essential (primary) hypertension: Secondary | ICD-10-CM | POA: Diagnosis not present

## 2020-12-12 DIAGNOSIS — Z0000 Encounter for general adult medical examination without abnormal findings: Secondary | ICD-10-CM | POA: Diagnosis not present

## 2020-12-12 DIAGNOSIS — Z1389 Encounter for screening for other disorder: Secondary | ICD-10-CM | POA: Diagnosis not present

## 2020-12-17 ENCOUNTER — Emergency Department (HOSPITAL_COMMUNITY)
Admission: EM | Admit: 2020-12-17 | Discharge: 2020-12-17 | Disposition: A | Discharge status: Home / Self Care | Payer: Medicare HMO | Attending: Emergency Medicine | Admitting: Emergency Medicine

## 2020-12-17 ENCOUNTER — Encounter (HOSPITAL_COMMUNITY)
Admission: EM | Disposition: A | Discharge status: Home / Self Care | Payer: Self-pay | Source: Home / Self Care | Attending: Emergency Medicine

## 2020-12-17 ENCOUNTER — Encounter (HOSPITAL_COMMUNITY): Payer: Self-pay | Admitting: Emergency Medicine

## 2020-12-17 ENCOUNTER — Emergency Department (HOSPITAL_COMMUNITY): Payer: Medicare HMO | Admitting: Certified Registered Nurse Anesthetist

## 2020-12-17 ENCOUNTER — Other Ambulatory Visit: Payer: Self-pay

## 2020-12-17 DIAGNOSIS — K3189 Other diseases of stomach and duodenum: Secondary | ICD-10-CM | POA: Insufficient documentation

## 2020-12-17 DIAGNOSIS — K209 Esophagitis, unspecified without bleeding: Secondary | ICD-10-CM | POA: Insufficient documentation

## 2020-12-17 DIAGNOSIS — T18128A Food in esophagus causing other injury, initial encounter: Secondary | ICD-10-CM | POA: Insufficient documentation

## 2020-12-17 DIAGNOSIS — Z7902 Long term (current) use of antithrombotics/antiplatelets: Secondary | ICD-10-CM | POA: Insufficient documentation

## 2020-12-17 DIAGNOSIS — Z885 Allergy status to narcotic agent status: Secondary | ICD-10-CM | POA: Insufficient documentation

## 2020-12-17 DIAGNOSIS — K222 Esophageal obstruction: Secondary | ICD-10-CM | POA: Insufficient documentation

## 2020-12-17 DIAGNOSIS — Z809 Family history of malignant neoplasm, unspecified: Secondary | ICD-10-CM | POA: Insufficient documentation

## 2020-12-17 DIAGNOSIS — Z8673 Personal history of transient ischemic attack (TIA), and cerebral infarction without residual deficits: Secondary | ICD-10-CM | POA: Diagnosis not present

## 2020-12-17 DIAGNOSIS — Z833 Family history of diabetes mellitus: Secondary | ICD-10-CM | POA: Diagnosis not present

## 2020-12-17 DIAGNOSIS — Z801 Family history of malignant neoplasm of trachea, bronchus and lung: Secondary | ICD-10-CM | POA: Diagnosis not present

## 2020-12-17 DIAGNOSIS — X58XXXA Exposure to other specified factors, initial encounter: Secondary | ICD-10-CM | POA: Insufficient documentation

## 2020-12-17 DIAGNOSIS — Z20822 Contact with and (suspected) exposure to covid-19: Secondary | ICD-10-CM | POA: Insufficient documentation

## 2020-12-17 DIAGNOSIS — Z8249 Family history of ischemic heart disease and other diseases of the circulatory system: Secondary | ICD-10-CM | POA: Insufficient documentation

## 2020-12-17 DIAGNOSIS — Z79899 Other long term (current) drug therapy: Secondary | ICD-10-CM | POA: Insufficient documentation

## 2020-12-17 DIAGNOSIS — Z803 Family history of malignant neoplasm of breast: Secondary | ICD-10-CM | POA: Insufficient documentation

## 2020-12-17 DIAGNOSIS — E785 Hyperlipidemia, unspecified: Secondary | ICD-10-CM | POA: Diagnosis not present

## 2020-12-17 HISTORY — PX: ESOPHAGOGASTRODUODENOSCOPY (EGD) WITH PROPOFOL: SHX5813

## 2020-12-17 LAB — RESP PANEL BY RT-PCR (FLU A&B, COVID) ARPGX2
Influenza A by PCR: NEGATIVE
Influenza B by PCR: NEGATIVE
SARS Coronavirus 2 by RT PCR: NEGATIVE

## 2020-12-17 SURGERY — REMOVAL, FOREIGN BODY
Anesthesia: Monitor Anesthesia Care

## 2020-12-17 SURGERY — ESOPHAGOGASTRODUODENOSCOPY (EGD) WITH PROPOFOL
Anesthesia: General

## 2020-12-17 MED ORDER — LACTATED RINGERS IV SOLN: INTRAVENOUS | Status: DC | PRN

## 2020-12-17 MED ORDER — ONDANSETRON HCL 4 MG/2ML IJ SOLN
INTRAMUSCULAR | Status: DC | PRN
  Administered 2020-12-17: 4 mg via INTRAVENOUS

## 2020-12-17 MED ORDER — FENTANYL CITRATE (PF) 100 MCG/2ML IJ SOLN
INTRAMUSCULAR | Status: AC
Start: 2020-12-17 — End: ?
  Filled 2020-12-17: qty 2

## 2020-12-17 MED ORDER — SUCCINYLCHOLINE CHLORIDE 200 MG/10ML IV SOSY
PREFILLED_SYRINGE | INTRAVENOUS | Status: DC | PRN
  Administered 2020-12-17: 100 mg via INTRAVENOUS

## 2020-12-17 MED ORDER — SODIUM CHLORIDE 0.9 % IV SOLN: INTRAVENOUS | Status: DC

## 2020-12-17 MED ORDER — FENTANYL CITRATE (PF) 250 MCG/5ML IJ SOLN
INTRAMUSCULAR | Status: DC | PRN
  Administered 2020-12-17: 25 ug via INTRAVENOUS

## 2020-12-17 MED ORDER — LIDOCAINE 2% (20 MG/ML) 5 ML SYRINGE
INTRAMUSCULAR | Status: DC | PRN
  Administered 2020-12-17: 40 mg via INTRAVENOUS

## 2020-12-17 MED ORDER — OMEPRAZOLE 40 MG PO CPDR: 40.0000 mg | DELAYED_RELEASE_CAPSULE | Freq: Two times a day (BID) | ORAL | 1 refills | Status: DC

## 2020-12-17 MED ORDER — PROPOFOL 10 MG/ML IV BOLUS
INTRAVENOUS | Status: DC | PRN
  Administered 2020-12-17: 130 mg via INTRAVENOUS

## 2020-12-17 SURGICAL SUPPLY — 15 items

## 2020-12-17 NOTE — Transfer of Care (Signed)
Immediate Anesthesia Transfer of Care Note  Patient: AZARI JANSSENS  Procedure(s) Performed: ESOPHAGOGASTRODUODENOSCOPY (EGD) WITH PROPOFOL (N/A )  Patient Location: Endoscopy Unit  Anesthesia Type:General  Level of Consciousness: awake, drowsy and patient cooperative  Airway & Oxygen Therapy: Patient Spontanous Breathing and Patient connected to face mask oxygen  Post-op Assessment: Report given to RN and Post -op Vital signs reviewed and stable  Post vital signs: Reviewed and stable  Last Vitals:  Vitals Value Taken Time  BP 166/77 1830  Temp    Pulse 83   Resp    SpO2 99%     Last Pain:  Vitals:   12/17/20 1738  TempSrc: Oral  PainSc: 0-No pain         Complications: No complications documented.

## 2020-12-17 NOTE — ED Provider Notes (Signed)
Signout from Dr. Zenia Resides.  75 year old female here with food impaction since yesterday.  GI Dr. Cristina Gong to evaluate patient for possible endoscopy. Physical Exam  BP 129/71   Pulse 74   Temp 98 F (36.7 C) (Oral)   Resp 17   Ht 5\' 3"  (1.6 m)   Wt 72.6 kg   SpO2 96%   BMI 28.34 kg/m   Physical Exam  ED Course/Procedures     Procedures  MDM  Patient returned from endoscopy suite states she is ready to go.  Have provided her prescription for Prilosec and gave her GI follow-up information.  Return instructions discussed       Hayden Rasmussen, MD 12/17/20 2001

## 2020-12-17 NOTE — Op Note (Signed)
[facility] Patient Name: Jordan Lane Procedure Date: 12/17/2020 MRN: [id number] Attending MD: Bernette Redbird , MD Date of Birth: Apr 25, 1946 CSN: [id number] Age: 75 Admit Type: Emergency Department Procedure:                Upper GI endoscopy Indications:              Foreign body in the esophagus--history of                            longstanding dysphagia, s/p previous food                            impactions and esophageal dilatation several years                            ago, now with food stuck in esophagus for the past                            20 hours, unable to consume liquids Providers:                Bernette Redbird, MD, Charlett Lango, RN, Leanne Lovely, Technician, Rosilyn Mings, Technician Referring MD:              Medicines:                General Anesthesia Complications:            No immediate complications. Estimated Blood Loss:     Estimated blood loss: none. Procedure:                Pre-Anesthesia Assessment:                           - Prior to the procedure, a History and Physical                            was performed, and patient medications and                            allergies were reviewed. The patient's tolerance of                            previous anesthesia was also reviewed. The risks                            and benefits of the procedure and the sedation                            options and risks were discussed with the patient.                            All questions were answered, and informed consent  was obtained. Prior Anticoagulants: The patient has                            taken Plavix (clopidogrel), last dose was 1 day                            prior to procedure. ASA Grade Assessment: II - A                            patient with mild systemic disease. After reviewing                            the risks and benefits, the patient was deemed in                             satisfactory condition to undergo the procedure.                           After obtaining informed consent, the endoscope was                            passed under direct vision. Throughout the                            procedure, the patient's blood pressure, pulse, and                            oxygen saturations were monitored continuously. The                            [id number] ([id number]) Olympus gastroscope was                            introduced through the mouth, and advanced to the                            second part of duodenum. The upper GI endoscopy was                            accomplished without difficulty. The patient                            tolerated the procedure well. Scope In: Scope Out: Findings:      Food (meat bolus) was found impacted in the distal esophagus. Removal of       food was accomplished, removing a couple of small pieces with the Rescue       basket, and then engaging the remaining portion of the bolus at which       time it passed spontaneously into the stomach and was released from the       basket.      Mild acute esophagitis with no bleeding was found at the GE junction,       presumably due to food stasis. There was no evidence of reflux  esophagitis above the GE junction.      A mild Schatzki ring was found at the gastroesophageal junction, but no       overt tight ring or stricture, no resistance to passage of the 29mm       endoscope.      Bilious fluid was found in the gastric fundus.      Diffuse moderately erythematous mucosa was found in the gastric antrum.      The exam of the stomach was otherwise normal.      The cardia and gastric fundus were normal on retroflexion.      The examined duodenum was normal. Impression:               - Food in the distal esophagus. Removal was                            successful.                           - Mild acute esophagitis with no bleeding,                             presumably due to stasis.                           - Mild Schatzki ring.                           - Bilious gastric fluid.                           - Erythematous mucosa in the antrum.                           - Normal examined duodenum. Moderate Sedation:      This patient was sedated with general anesthesia, not moderate sedation. Recommendation:           - Use Prilosec (omeprazole) 40 mg PO BID for 1                            month, then may cut back to previous dose.                           - Return to my office at appointment to be                            scheduled, to arrange endoscopic dilatation. Procedure Code(s):        --- Professional ---                           [phone], Esophagogastroduodenoscopy, flexible,                            transoral; with removal of foreign body(s) Diagnosis Code(s):        --- Professional ---  M35.361W, Food in esophagus causing other injury,                            initial encounter                           K20.90, Esophagitis, unspecified without bleeding                           K22.2, Esophageal obstruction                           K31.89, Other diseases of stomach and duodenum CPT copyright 2019 American Medical Association. All rights reserved. The codes documented in this report are preliminary and upon coder review may  be revised to meet current compliance requirements. Bernette Redbird, MD 12/17/2020 6:42:13 PM This report has been signed electronically. Number of Addenda: 0

## 2020-12-17 NOTE — Anesthesia Postprocedure Evaluation (Signed)
Anesthesia Post Note  Patient: Jordan Lane  Procedure(s) Performed: ESOPHAGOGASTRODUODENOSCOPY (EGD) WITH PROPOFOL (N/A )     Patient location during evaluation: PACU Anesthesia Type: General Level of consciousness: awake and alert Pain management: pain level controlled Vital Signs Assessment: post-procedure vital signs reviewed and stable Respiratory status: spontaneous breathing, nonlabored ventilation, respiratory function stable and patient connected to nasal cannula oxygen Cardiovascular status: blood pressure returned to baseline and stable Postop Assessment: no apparent nausea or vomiting Anesthetic complications: no   No complications documented.  Last Vitals:  Vitals:   12/17/20 1850 12/17/20 1958  BP: (!) 152/56 105/62  Pulse: 71 (!) 56  Resp: 15 18  Temp:    SpO2: 96% 96%    Last Pain:  Vitals:   12/17/20 2005  TempSrc:   PainSc: 0-No pain                 Effie Berkshire

## 2020-12-17 NOTE — H&P (Signed)
Jordan Lane is an 75 y.o. female.   Chief Complaint: Esophageal food impaction  HPI: This is a patient of mine who, through the years, has had a couple of food impactions and has required periodic esophageal dilatation of a Schatzki's ring, most recently to 15 mm several years ago.   She has a history of reflux which is quite well controlled on omeprazole once daily, sometimes twice a day.  She was doing fairly well until recent months when she began to have more frequent episodes of food impaction, which she could clear by regurgitation. Last night, while eating ribs, a piece of meat got stuck which she has been unable to clear. She is unable to keep down any ingested fluid or medication.  There is no known cardiopulmonary disease but the patient did have a stroke with minimal residual, about 5 years ago. She remains on Plavix.  Past Medical History:  Diagnosis Date  . Arthritis   . Fibroadenoma of breast    Right  . GERD (gastroesophageal reflux disease)   . History of blood transfusion 52 yrs ago  . Hypertension   . Reflux   . Stroke [facility]) 10/2015    Past Surgical History:  Procedure Laterality Date  . ABDOMINAL HYSTERECTOMY  1984   TAH, partial  . CHOLECYSTECTOMY  2009  . ESOPHAGOGASTRODUODENOSCOPY  11/14/2012   Procedure: ESOPHAGOGASTRODUODENOSCOPY (EGD);  Surgeon: Wonda Horner, MD;  [facility] END[facility]COPY;  Service: Endoscopy;  Laterality: N/A;  . ESOPHAGOGASTRODUODENOSCOPY (EGD) WITH PROPOFOL N/A 09/21/2016   Procedure: ESOPHAGOGASTRODUODENOSCOPY (EGD) WITH PROPOFOL;  Surgeon: Garlan Fair, MD;  Location: [facility] ENDOSCOPY;  Service: Endoscopy;  Laterality: N/A;  . Herniated disc  1987   lower back   . KNEE SURGERY  left 2010, right 2013   Arthroscopic  . PARTIAL KNEE ARTHROPLASTY  06/14/2012   Procedure: UNICOMPARTMENTAL KNEE;  Surgeon: Mauri Pole, MD;  Location: [facility] ORS;  Service: Orthopedics;  Laterality: Left;  Left Medial Unicompartmental Knee  . PARTIAL KNEE  ARTHROPLASTY Right 11/27/2013   Procedure: UNICOMPARTMENTAL RIGHT KNEE, Steroid injection in right great toe;  Surgeon: Mauri Pole, MD;  Location: [facility] ORS;  Service: Orthopedics;  Laterality: Right;    Family History  Problem Relation Age of Onset  . Hypertension Father   . Diabetes Father   . Cancer - Lung Father   . Other Mother        Brain tumor  . Breast cancer Paternal Aunt        Age 67's  . Breast cancer Paternal Grandmother        Age 74   Social History:  reports that she has never smoked. She has never used smokeless tobacco. She reports that she does not drink alcohol and does not use drugs.  Allergies:  Allergies  Allergen Reactions  . Codeine Nausea And Vomiting    Medications Prior to Admission  Medication Sig Dispense Refill  . ALPRAZolam (XANAX) 0.25 MG tablet Take 0.25 mg by mouth at bedtime as needed.    Marland Kitchen amLODipine (NORVASC) 5 MG tablet Take 1 tablet (5 mg total) by mouth daily after breakfast. RESUME AFTER 2 DAYS    . atorvastatin (LIPITOR) 20 MG tablet Take 1 tablet (20 mg total) by mouth daily at 6 PM. 30 tablet 2  . Calcium Carbonate-Vitamin D (CALCIUM + D PO) Take 1 tablet by mouth daily.     . cholecalciferol (VITAMIN D) 1000 UNITS tablet Take 1,000 Units by mouth daily.     Marland Kitchen  clopidogrel (PLAVIX) 75 MG tablet Take 1 tablet (75 mg total) by mouth daily. 30 tablet 2  . Multiple Vitamin (MULTIVITAMIN) tablet Take 1 tablet by mouth daily.    . ranitidine (ZANTAC) 150 MG tablet Take 150 mg by mouth 2 (two) times daily.     . valsartan-hydrochlorothiazide (DIOVAN-HCT) 160-12.5 MG tablet Take 1 tablet by mouth daily after breakfast. RESUME AFTER 2 DAYS.    Marland Kitchen diphenhydrAMINE (BENADRYL) 25 mg capsule Take 25 mg by mouth every 6 (six) hours as needed for allergies.    Marland Kitchen glucosamine-chondroitin 500-400 MG tablet Take 1 tablet by mouth daily.    Marland Kitchen lidocaine (XYLOCAINE) 5 % ointment Apply 1 application topically as needed. 35.44 g 0  . naproxen sodium (ANAPROX)  220 MG tablet Take 220 mg by mouth 2 (two) times daily as needed (for pain).      Results for orders placed or performed during the hospital encounter of 12/17/20 (from the past 48 hour(s))  Resp Panel by RT-PCR (Flu A&B, Covid) Nasopharyngeal Swab     Status: None   Collection Time: 12/17/20  2:44 PM   Specimen: Nasopharyngeal Swab; Nasopharyngeal(NP) swabs in vial transport medium  Result Value Ref Range   SARS Coronavirus 2 by RT PCR NEGATIVE NEGATIVE    Comment: (NOTE) SARS-CoV-2 target nucleic acids are NOT DETECTED.  The SARS-CoV-2 RNA is generally detectable in upper respiratory specimens during the acute phase of infection. The lowest concentration of SARS-CoV-2 viral copies this assay can detect is 138 copies/mL. A negative result does not preclude SARS-Cov-2 infection and should not be used as the sole basis for treatment or other patient management decisions. A negative result may occur with  improper specimen collection/handling, submission of specimen other than nasopharyngeal swab, presence of viral mutation(s) within the areas targeted by this assay, and inadequate number of viral copies(<138 copies/mL). A negative result must be combined with clinical observations, patient history, and epidemiological information. The expected result is Negative.  Fact Sheet for Patients:  EntrepreneurPulse.com.au  Fact Sheet for Healthcare Providers:  IncredibleEmployment.be  This test is no t yet approved or cleared by the [place] FDA and  has been authorized for detection and/or diagnosis of SARS-CoV-2 by FDA under an Emergency Use Authorization (EUA). This EUA will remain  in effect (meaning this test can be used) for the duration of the COVID-19 declaration under Section 564(b)(1) of the Act, 21 U.S.C.section 360bbb-3(b)(1), unless the authorization is terminated  or revoked sooner.       Influenza A by PCR NEGATIVE NEGATIVE    Influenza B by PCR NEGATIVE NEGATIVE    Comment: (NOTE) The Xpert Xpress SARS-CoV-2/FLU/RSV plus assay is intended as an aid in the diagnosis of influenza from Nasopharyngeal swab specimens and should not be used as a sole basis for treatment. Nasal washings and aspirates are unacceptable for Xpert Xpress SARS-CoV-2/FLU/RSV testing.  Fact Sheet for Patients: EntrepreneurPulse.com.au  Fact Sheet for Healthcare Providers: IncredibleEmployment.be  This test is not yet approved or cleared by the [place] FDA and has been authorized for detection and/or diagnosis of SARS-CoV-2 by FDA under an Emergency Use Authorization (EUA). This EUA will remain in effect (meaning this test can be used) for the duration of the COVID-19 declaration under Section 564(b)(1) of the Act, 21 U.S.C. section 360bbb-3(b)(1), unless the authorization is terminated or revoked.  Performed at [facility], [place] [address], [place], [place] [address]    No results found.  Review of Systems negative for  anorexia, weight loss, or progressive dysphagia to suggest esophageal cancer  Blood pressure (!) 142/93, pulse 74, temperature 98.7 F (37.1 C), temperature source Oral, resp. rate 20, height 5\' 3"  (1.6 m), weight 72.6 kg, SpO2 96 %.   Physical Exam pleasant, healthy appearing female appearing somewhat younger than her age, anicteric and without pallor. Chest is clear, heart is without murmur or arrhythmia, abdomen is mildly adipose but without mass-effect or tenderness. No obvious focal neurologic deficits, cognitively intact, no skin rashes.  Assessment/Plan Esophageal food impaction, recurrent.   Clinically stable for endoscopic disimpaction. Risks reviewed, patient agreeable. She is aware that she may be receiving general endotracheal anesthesia for the procedure.  Cleotis Nipper, MD 12/17/2020, 5:49 PM

## 2020-12-17 NOTE — Anesthesia Procedure Notes (Signed)
Procedure Name: Intubation Date/Time: 12/17/2020 6:03 PM Performed by: Niel Hummer, CRNA Pre-anesthesia Checklist: Patient identified, Emergency Drugs available, Suction available and Patient being monitored Patient Re-evaluated:Patient Re-evaluated prior to induction Oxygen Delivery Method: Circle system utilized Preoxygenation: Pre-oxygenation with 100% oxygen Induction Type: IV induction, Rapid sequence and Cricoid Pressure applied Laryngoscope Size: Mac and 4 Grade View: Grade I Tube type: Oral Tube size: 7.0 mm Number of attempts: 1 Airway Equipment and Method: Stylet Placement Confirmation: ETT inserted through vocal cords under direct vision,  positive ETCO2 and breath sounds checked- equal and bilateral Secured at: 22 cm Tube secured with: Tape Dental Injury: Teeth and Oropharynx as per pre-operative assessment

## 2020-12-17 NOTE — ED Provider Notes (Signed)
[facility] DEPT Provider Note   CSN: [id number] Arrival date & time: 12/17/20  1339     History No chief complaint on file.   Jordan Lane is a 75 y.o. female.  75 year old female presents with food impaction since last evening. States that she had an felt like food was caught in her lower esophagus. History of similar episodes which required EGD to remove her food bolus. States that when she drinks liquid it comes back up after short period of time. Called her GI doctor and told to come here        Past Medical History:  Diagnosis Date  . Arthritis   . Fibroadenoma of breast    Right  . GERD (gastroesophageal reflux disease)   . History of blood transfusion 52 yrs ago  . Hypertension   . Reflux   . Stroke [facility] 10/2015    Patient Active Problem List   Diagnosis Date Noted  . Palpitations 06/29/2016  . Burning pain 01/07/2016  . Acute ischemic stroke ([place]) 10/22/2015  . HLD (hyperlipidemia)   . 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 Obese 11/28/2013  . 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 Tachycardia 06/14/2012  . PVC (premature ventricular contraction) 06/14/2012  . HTN (hypertension) 06/14/2012  . Reflux   . Uterine prolapse   . Essential hypertension   . Fibroadenoma of breast     Past Surgical History:  Procedure Laterality Date  . ABDOMINAL HYSTERECTOMY  1984   TAH, partial  . CHOLECYSTECTOMY  2009  . ESOPHAGOGASTRODUODENOSCOPY  11/14/2012   Procedure: ESOPHAGOGASTRODUODENOSCOPY (EGD);  Surgeon: Wonda Horner, MD;  Location: [facility] ENDOSCOPY;  Service: Endoscopy;  Laterality: N/A;  . ESOPHAGOGASTRODUODENOSCOPY (EGD) WITH PROPOFOL N/A 09/21/2016   Procedure: ESOPHAGOGASTRODUODENOSCOPY (EGD) WITH PROPOFOL;  Surgeon: Garlan Fair, MD;  Location: [facility] ENDOSCOPY;  Service: Endoscopy;  Laterality: N/A;  . Herniated disc  1987   lower back   . KNEE SURGERY   left 2010, right 2013   Arthroscopic  . PARTIAL KNEE ARTHROPLASTY  06/14/2012   Procedure: UNICOMPARTMENTAL KNEE;  Surgeon: Mauri Pole, MD;  Location: [facility] ORS;  Service: Orthopedics;  Laterality: Left;  Left Medial Unicompartmental Knee  . PARTIAL KNEE ARTHROPLASTY Right 11/27/2013   Procedure: UNICOMPARTMENTAL RIGHT KNEE, Steroid injection in right great toe;  Surgeon: Mauri Pole, MD;  Location: [facility] ORS;  Service: Orthopedics;  Laterality: Right;     OB History    Gravida  3   Para  3   Term  3   Preterm      AB      Living  3     SAB      IAB      Ectopic      Multiple      Live Births              Family History  Problem Relation Age of Onset  . Hypertension Father   . Diabetes Father   . Cancer - Lung Father   . Other Mother        Brain tumor  . Breast cancer Paternal Aunt        Age 28's  . Breast cancer Paternal Grandmother        Age 55    Social History   Tobacco Use  . Smoking status: Never Smoker  . Smokeless tobacco: Never Used  Substance Use  Topics  . Alcohol use: No  . Drug use: No    Home Medications Prior to Admission medications   Medication Sig Start Date End Date Taking? Authorizing Provider  ALPRAZolam (XANAX) 0.25 MG tablet Take 0.25 mg by mouth at bedtime as needed. 09/13/15   [provider]  amLODipine (NORVASC) 5 MG tablet Take 1 tablet (5 mg total) by mouth daily after breakfast. RESUME AFTER 2 DAYS 10/22/15   Bonnielee Haff, MD  atorvastatin (LIPITOR) 20 MG tablet Take 1 tablet (20 mg total) by mouth daily at 6 PM. 10/23/15   Bonnielee Haff, MD  Calcium Carbonate-Vitamin D (CALCIUM + D PO) Take 1 tablet by mouth daily.     [provider]  cholecalciferol (VITAMIN D) 1000 UNITS tablet Take 1,000 Units by mouth daily.     [provider]  clopidogrel (PLAVIX) 75 MG tablet Take 1 tablet (75 mg total) by mouth daily. 10/23/15   Bonnielee Haff, MD  diphenhydrAMINE (BENADRYL) 25 mg capsule  Take 25 mg by mouth every 6 (six) hours as needed for allergies.    [provider]  glucosamine-chondroitin 500-400 MG tablet Take 1 tablet by mouth daily.    [provider]  lidocaine (XYLOCAINE) 5 % ointment Apply 1 application topically as needed. 01/07/16   Rosalin Hawking, MD  Multiple Vitamin (MULTIVITAMIN) tablet Take 1 tablet by mouth daily.    [provider]  naproxen sodium (ANAPROX) 220 MG tablet Take 220 mg by mouth 2 (two) times daily as needed (for pain).    [provider]  ranitidine (ZANTAC) 150 MG tablet Take 150 mg by mouth 2 (two) times daily.     [provider]  valsartan-hydrochlorothiazide (DIOVAN-HCT) 160-12.5 MG tablet Take 1 tablet by mouth daily after breakfast. RESUME AFTER 2 DAYS. 10/22/15   Bonnielee Haff, MD    Allergies    Codeine  Review of Systems   Review of Systems  All other systems reviewed and are negative.   Physical Exam Updated Vital Signs BP 114/86   Pulse (!) 112   Temp 98 F (36.7 C) (Oral)   Resp 18   Ht 1.6 m (5\' 3" )   Wt 72.6 kg   SpO2 97%   BMI 28.34 kg/m   Physical Exam Vitals and nursing note reviewed.  Constitutional:      General: She is not in acute distress.    Appearance: Normal appearance. She is well-developed and well-nourished. She is not toxic-appearing.  HENT:     Head: Normocephalic and atraumatic.  Eyes:     General: Lids are normal.     Extraocular Movements: EOM normal.     Conjunctiva/sclera: Conjunctivae normal.     Pupils: Pupils are equal, round, and reactive to light.  Neck:     Thyroid: No thyroid mass.     Trachea: No tracheal deviation.  Cardiovascular:     Rate and Rhythm: Normal rate and regular rhythm.     Heart sounds: Normal heart sounds. No murmur heard. No gallop.   Pulmonary:     Effort: Pulmonary effort is normal. No respiratory distress.     Breath sounds: Normal breath sounds. No stridor. No decreased breath sounds, wheezing, rhonchi or  rales.  Abdominal:     General: Bowel sounds are normal. There is no distension.     Palpations: Abdomen is soft.     Tenderness: There is no abdominal tenderness. There is no CVA tenderness or rebound.  Musculoskeletal:  General: No tenderness or edema. Normal range of motion.     Cervical back: Normal range of motion and neck supple.  Skin:    General: Skin is warm and dry.     Findings: No abrasion or rash.  Neurological:     Mental Status: She is alert and oriented to person, place, and time.     GCS: GCS eye subscore is 4. GCS verbal subscore is 5. GCS motor subscore is 6.     Cranial Nerves: No cranial nerve deficit.     Sensory: No sensory deficit.     Deep Tendon Reflexes: Strength normal.  Psychiatric:        Mood and Affect: Mood and affect normal.        Speech: Speech normal.        Behavior: Behavior normal.     ED Results / Procedures / Treatments   Labs (all labs ordered are listed, but only abnormal results are displayed) Labs Reviewed  RESP PANEL BY RT-PCR (FLU A&B, COVID) [id number]    EKG None  Radiology No results found.  Procedures Procedures (including critical care time)  Medications Ordered in ED Medications  0.9 %  sodium chloride infusion ( Intravenous New Bag/Given 12/17/20 1455)    ED Course  I have reviewed the triage vital signs and the nursing notes.  Pertinent labs & imaging results that were available during my care of the patient were reviewed by me and considered in my medical decision making (see chart for details).    MDM Rules/Calculators/A&P                          Will consult [facility] for emergent EGD to remove likely food bolus Final Clinical Impression(s) / ED Diagnoses Final diagnoses:  None    Rx / DC Orders ED Discharge Orders    None       Lacretia Leigh, MD 12/17/20 1506

## 2020-12-17 NOTE — Anesthesia Preprocedure Evaluation (Addendum)
Anesthesia Evaluation  Patient identified by MRN, date of birth, ID band Patient awake    Reviewed: Allergy & Precautions, NPO status , Patient's Chart, lab work & pertinent test results  Airway Mallampati: I  TM Distance: >3 FB Neck ROM: Full    Dental  (+) Edentulous Upper, Edentulous Lower   Pulmonary neg pulmonary ROS,    breath sounds clear to auscultation       Cardiovascular hypertension, Pt. on medications  Rhythm:Regular Rate:Normal     Neuro/Psych CVA negative psych ROS   GI/Hepatic Neg liver ROS, GERD  Medicated,  Endo/Other  negative endocrine ROS  Renal/GU negative Renal ROS     Musculoskeletal  (+) Arthritis ,   Abdominal Normal abdominal exam  (+)   Peds  Hematology negative hematology ROS (+)   Anesthesia Other Findings   Reproductive/Obstetrics                            Anesthesia Physical Anesthesia Plan  ASA: II and emergent  Anesthesia Plan: General   Post-op Pain Management:    Induction: Intravenous, Rapid sequence and Cricoid pressure planned  PONV Risk Score and Plan: 4 or greater and Ondansetron, Dexamethasone and Midazolam  Airway Management Planned: Oral ETT  Additional Equipment: None  Intra-op Plan:   Post-operative Plan: Extubation in OR  Informed Consent: I have reviewed the patients History and Physical, chart, labs and discussed the procedure including the risks, benefits and alternatives for the proposed anesthesia with the patient or authorized representative who has indicated his/her understanding and acceptance.     Dental advisory given  Plan Discussed with: CRNA  Anesthesia Plan Comments:       Anesthesia Quick Evaluation

## 2020-12-17 NOTE — ED Triage Notes (Signed)
States she has some bear rib meat stuck in her esophagus around 8pm last night, has not been able to eat or drink since then, just keeps regurgitating her food. Hx of same, needed endoscopy to retrieve food bolus before. Called [facility] and they told her to come to the ED.

## 2020-12-17 NOTE — Discharge Instructions (Addendum)
You were seen in the emergency department for evaluation of esophageal blockage.  Dr. Cristina Gong took to the endoscopy suite where he removed impacted meat in your distal esophagus.  He is recommending that you go on Prilosec twice a day.  Please call his office for follow-up.  Soft diet, advance as tolerated.  Return to the emergency department if any worsening or concerning symptoms

## 2020-12-18 ENCOUNTER — Encounter (HOSPITAL_COMMUNITY): Payer: Self-pay | Admitting: Gastroenterology

## 2020-12-25 DIAGNOSIS — K222 Esophageal obstruction: Secondary | ICD-10-CM | POA: Diagnosis not present

## 2020-12-25 DIAGNOSIS — R1319 Other dysphagia: Secondary | ICD-10-CM | POA: Diagnosis not present

## 2021-01-06 DIAGNOSIS — Z1231 Encounter for screening mammogram for malignant neoplasm of breast: Secondary | ICD-10-CM | POA: Diagnosis not present

## 2021-01-21 DIAGNOSIS — Z01812 Encounter for preprocedural laboratory examination: Secondary | ICD-10-CM | POA: Diagnosis not present

## 2021-01-24 DIAGNOSIS — R1314 Dysphagia, pharyngoesophageal phase: Secondary | ICD-10-CM | POA: Diagnosis not present

## 2021-01-24 DIAGNOSIS — K222 Esophageal obstruction: Secondary | ICD-10-CM | POA: Diagnosis not present

## 2021-02-24 DIAGNOSIS — I10 Essential (primary) hypertension: Secondary | ICD-10-CM | POA: Diagnosis not present

## 2021-02-24 DIAGNOSIS — E781 Pure hyperglyceridemia: Secondary | ICD-10-CM | POA: Diagnosis not present

## 2021-02-24 DIAGNOSIS — K219 Gastro-esophageal reflux disease without esophagitis: Secondary | ICD-10-CM | POA: Diagnosis not present

## 2021-02-24 DIAGNOSIS — M8580 Other specified disorders of bone density and structure, unspecified site: Secondary | ICD-10-CM | POA: Diagnosis not present

## 2021-02-24 DIAGNOSIS — M179 Osteoarthritis of knee, unspecified: Secondary | ICD-10-CM | POA: Diagnosis not present

## 2021-02-24 DIAGNOSIS — E782 Mixed hyperlipidemia: Secondary | ICD-10-CM | POA: Diagnosis not present

## 2021-03-26 DIAGNOSIS — K5790 Diverticulosis of intestine, part unspecified, without perforation or abscess without bleeding: Secondary | ICD-10-CM | POA: Diagnosis not present

## 2021-03-26 DIAGNOSIS — R1310 Dysphagia, unspecified: Secondary | ICD-10-CM | POA: Diagnosis not present

## 2021-03-26 DIAGNOSIS — K222 Esophageal obstruction: Secondary | ICD-10-CM | POA: Diagnosis not present

## 2021-04-09 DIAGNOSIS — M25562 Pain in left knee: Secondary | ICD-10-CM | POA: Diagnosis not present

## 2021-04-09 DIAGNOSIS — M25561 Pain in right knee: Secondary | ICD-10-CM | POA: Diagnosis not present

## 2021-04-17 DIAGNOSIS — E781 Pure hyperglyceridemia: Secondary | ICD-10-CM | POA: Diagnosis not present

## 2021-04-17 DIAGNOSIS — I10 Essential (primary) hypertension: Secondary | ICD-10-CM | POA: Diagnosis not present

## 2021-04-17 DIAGNOSIS — E782 Mixed hyperlipidemia: Secondary | ICD-10-CM | POA: Diagnosis not present

## 2021-04-17 DIAGNOSIS — M8580 Other specified disorders of bone density and structure, unspecified site: Secondary | ICD-10-CM | POA: Diagnosis not present

## 2021-04-17 DIAGNOSIS — K219 Gastro-esophageal reflux disease without esophagitis: Secondary | ICD-10-CM | POA: Diagnosis not present

## 2021-04-17 DIAGNOSIS — M179 Osteoarthritis of knee, unspecified: Secondary | ICD-10-CM | POA: Diagnosis not present

## 2021-04-22 DIAGNOSIS — D485 Neoplasm of uncertain behavior of skin: Secondary | ICD-10-CM | POA: Diagnosis not present

## 2021-04-22 DIAGNOSIS — L820 Inflamed seborrheic keratosis: Secondary | ICD-10-CM | POA: Diagnosis not present

## 2021-04-22 DIAGNOSIS — Z85828 Personal history of other malignant neoplasm of skin: Secondary | ICD-10-CM | POA: Diagnosis not present

## 2021-05-15 DIAGNOSIS — R42 Dizziness and giddiness: Secondary | ICD-10-CM | POA: Diagnosis not present

## 2021-05-15 DIAGNOSIS — R350 Frequency of micturition: Secondary | ICD-10-CM | POA: Diagnosis not present

## 2021-05-15 DIAGNOSIS — H6123 Impacted cerumen, bilateral: Secondary | ICD-10-CM | POA: Diagnosis not present

## 2021-06-19 DIAGNOSIS — R42 Dizziness and giddiness: Secondary | ICD-10-CM | POA: Diagnosis not present

## 2021-06-19 DIAGNOSIS — K219 Gastro-esophageal reflux disease without esophagitis: Secondary | ICD-10-CM | POA: Diagnosis not present

## 2021-06-19 DIAGNOSIS — I10 Essential (primary) hypertension: Secondary | ICD-10-CM | POA: Diagnosis not present

## 2021-07-03 DIAGNOSIS — E876 Hypokalemia: Secondary | ICD-10-CM | POA: Diagnosis not present

## 2021-07-21 DIAGNOSIS — H04123 Dry eye syndrome of bilateral lacrimal glands: Secondary | ICD-10-CM | POA: Diagnosis not present

## 2021-07-21 DIAGNOSIS — Z961 Presence of intraocular lens: Secondary | ICD-10-CM | POA: Diagnosis not present

## 2021-07-21 DIAGNOSIS — H524 Presbyopia: Secondary | ICD-10-CM | POA: Diagnosis not present

## 2021-07-21 DIAGNOSIS — H18513 Endothelial corneal dystrophy, bilateral: Secondary | ICD-10-CM | POA: Diagnosis not present

## 2021-08-01 DIAGNOSIS — D225 Melanocytic nevi of trunk: Secondary | ICD-10-CM | POA: Diagnosis not present

## 2021-08-01 DIAGNOSIS — L820 Inflamed seborrheic keratosis: Secondary | ICD-10-CM | POA: Diagnosis not present

## 2021-08-01 DIAGNOSIS — L821 Other seborrheic keratosis: Secondary | ICD-10-CM | POA: Diagnosis not present

## 2021-08-01 DIAGNOSIS — Z85828 Personal history of other malignant neoplasm of skin: Secondary | ICD-10-CM | POA: Diagnosis not present

## 2021-08-01 DIAGNOSIS — L905 Scar conditions and fibrosis of skin: Secondary | ICD-10-CM | POA: Diagnosis not present

## 2021-08-24 DIAGNOSIS — M8580 Other specified disorders of bone density and structure, unspecified site: Secondary | ICD-10-CM | POA: Diagnosis not present

## 2021-08-24 DIAGNOSIS — I10 Essential (primary) hypertension: Secondary | ICD-10-CM | POA: Diagnosis not present

## 2021-08-24 DIAGNOSIS — K219 Gastro-esophageal reflux disease without esophagitis: Secondary | ICD-10-CM | POA: Diagnosis not present

## 2021-08-24 DIAGNOSIS — M179 Osteoarthritis of knee, unspecified: Secondary | ICD-10-CM | POA: Diagnosis not present

## 2021-08-24 DIAGNOSIS — E781 Pure hyperglyceridemia: Secondary | ICD-10-CM | POA: Diagnosis not present

## 2021-08-24 DIAGNOSIS — E782 Mixed hyperlipidemia: Secondary | ICD-10-CM | POA: Diagnosis not present

## 2021-09-11 ENCOUNTER — Emergency Department (HOSPITAL_COMMUNITY): Payer: Medicare HMO

## 2021-09-11 ENCOUNTER — Encounter (HOSPITAL_COMMUNITY): Payer: Self-pay

## 2021-09-11 ENCOUNTER — Inpatient Hospital Stay (HOSPITAL_COMMUNITY)
Admission: EM | Admit: 2021-09-11 | Discharge: 2021-09-13 | DRG: 062 | Disposition: A | Discharge status: Home / Self Care | Payer: Medicare HMO | Attending: Neurology | Admitting: Neurology

## 2021-09-11 ENCOUNTER — Inpatient Hospital Stay (HOSPITAL_COMMUNITY): Payer: Medicare HMO

## 2021-09-11 ENCOUNTER — Other Ambulatory Visit: Payer: Self-pay

## 2021-09-11 DIAGNOSIS — I63512 Cerebral infarction due to unspecified occlusion or stenosis of left middle cerebral artery: Secondary | ICD-10-CM | POA: Diagnosis not present

## 2021-09-11 DIAGNOSIS — I10 Essential (primary) hypertension: Secondary | ICD-10-CM | POA: Diagnosis not present

## 2021-09-11 DIAGNOSIS — R4701 Aphasia: Secondary | ICD-10-CM | POA: Diagnosis not present

## 2021-09-11 DIAGNOSIS — G459 Transient cerebral ischemic attack, unspecified: Principal | ICD-10-CM | POA: Diagnosis present

## 2021-09-11 DIAGNOSIS — Z885 Allergy status to narcotic agent status: Secondary | ICD-10-CM

## 2021-09-11 DIAGNOSIS — I639 Cerebral infarction, unspecified: Secondary | ICD-10-CM

## 2021-09-11 DIAGNOSIS — Z833 Family history of diabetes mellitus: Secondary | ICD-10-CM

## 2021-09-11 DIAGNOSIS — I6782 Cerebral ischemia: Secondary | ICD-10-CM | POA: Diagnosis not present

## 2021-09-11 DIAGNOSIS — K219 Gastro-esophageal reflux disease without esophagitis: Secondary | ICD-10-CM | POA: Diagnosis present

## 2021-09-11 DIAGNOSIS — E785 Hyperlipidemia, unspecified: Secondary | ICD-10-CM | POA: Diagnosis not present

## 2021-09-11 DIAGNOSIS — Z801 Family history of malignant neoplasm of trachea, bronchus and lung: Secondary | ICD-10-CM | POA: Diagnosis not present

## 2021-09-11 DIAGNOSIS — Z8249 Family history of ischemic heart disease and other diseases of the circulatory system: Secondary | ICD-10-CM | POA: Diagnosis not present

## 2021-09-11 DIAGNOSIS — Z7902 Long term (current) use of antithrombotics/antiplatelets: Secondary | ICD-10-CM

## 2021-09-11 DIAGNOSIS — R0902 Hypoxemia: Secondary | ICD-10-CM | POA: Diagnosis not present

## 2021-09-11 DIAGNOSIS — R29706 NIHSS score 6: Secondary | ICD-10-CM | POA: Diagnosis present

## 2021-09-11 DIAGNOSIS — R29818 Other symptoms and signs involving the nervous system: Secondary | ICD-10-CM | POA: Diagnosis not present

## 2021-09-11 DIAGNOSIS — Z803 Family history of malignant neoplasm of breast: Secondary | ICD-10-CM | POA: Diagnosis not present

## 2021-09-11 DIAGNOSIS — Z96652 Presence of left artificial knee joint: Secondary | ICD-10-CM | POA: Diagnosis present

## 2021-09-11 DIAGNOSIS — R569 Unspecified convulsions: Secondary | ICD-10-CM | POA: Diagnosis not present

## 2021-09-11 DIAGNOSIS — Z8673 Personal history of transient ischemic attack (TIA), and cerebral infarction without residual deficits: Secondary | ICD-10-CM | POA: Diagnosis not present

## 2021-09-11 DIAGNOSIS — I700 Atherosclerosis of aorta: Secondary | ICD-10-CM | POA: Diagnosis not present

## 2021-09-11 LAB — DIFFERENTIAL
Abs Immature Granulocytes: 0.02 10*3/uL (ref 0.00–0.07)
Basophils Absolute: 0.1 10*3/uL (ref 0.0–0.1)
Basophils Relative: 1 %
Eosinophils Absolute: 0.1 10*3/uL (ref 0.0–0.5)
Eosinophils Relative: 1 %
Immature Granulocytes: 0 %
Lymphocytes Relative: 37 %
Lymphs Abs: 3.8 10*3/uL (ref 0.7–4.0)
Monocytes Absolute: 0.9 10*3/uL (ref 0.1–1.0)
Monocytes Relative: 9 %
Neutro Abs: 5.4 10*3/uL (ref 1.7–7.7)
Neutrophils Relative %: 52 %

## 2021-09-11 LAB — CBC
HCT: 44.2 % (ref 36.0–46.0)
Hemoglobin: 15.1 g/dL — ABNORMAL HIGH (ref 12.0–15.0)
MCH: 31.9 pg (ref 26.0–34.0)
MCHC: 34.2 g/dL (ref 30.0–36.0)
MCV: 93.2 fL (ref 80.0–100.0)
Platelets: 286 10*3/uL (ref 150–400)
RBC: 4.74 MIL/uL (ref 3.87–5.11)
RDW: 12.8 % (ref 11.5–15.5)
WBC: 10.3 10*3/uL (ref 4.0–10.5)
nRBC: 0 % (ref 0.0–0.2)

## 2021-09-11 LAB — COMPREHENSIVE METABOLIC PANEL
ALT: 19 U/L (ref 0–44)
AST: 34 U/L (ref 15–41)
Albumin: 4.2 g/dL (ref 3.5–5.0)
Alkaline Phosphatase: 54 U/L (ref 38–126)
Anion gap: 10 (ref 5–15)
BUN: 13 mg/dL (ref 8–23)
CO2: 29 mmol/L (ref 22–32)
Calcium: 9.9 mg/dL (ref 8.9–10.3)
Chloride: 100 mmol/L (ref 98–111)
Creatinine, Ser: 0.89 mg/dL (ref 0.44–1.00)
GFR, Estimated: >60 mL/min (ref >60)
Glucose, Bld: 139 mg/dL — ABNORMAL HIGH (ref 70–99)
Potassium: 3.5 mmol/L (ref 3.5–5.1)
Sodium: 139 mmol/L (ref 135–145)
Total Bilirubin: 0.8 mg/dL (ref 0.3–1.2)
Total Protein: 7.3 g/dL (ref 6.5–8.1)

## 2021-09-11 LAB — APTT: aPTT: 34 seconds (ref 24–36)

## 2021-09-11 LAB — I-STAT CHEM 8, ED
BUN: 14 mg/dL (ref 8–23)
Calcium, Ion: 1.17 mmol/L (ref 1.15–1.40)
Chloride: 100 mmol/L (ref 98–111)
Creatinine, Ser: 0.9 mg/dL (ref 0.44–1.00)
Glucose, Bld: 134 mg/dL — ABNORMAL HIGH (ref 70–99)
HCT: 44 % (ref 36.0–46.0)
Hemoglobin: 15 g/dL (ref 12.0–15.0)
Potassium: 3.3 mmol/L — ABNORMAL LOW (ref 3.5–5.1)
Sodium: 141 mmol/L (ref 135–145)
TCO2: 28 mmol/L (ref 22–32)

## 2021-09-11 LAB — PROTIME-INR
INR: 1.1 (ref 0.8–1.2)
Prothrombin Time: 13.9 seconds (ref 11.4–15.2)

## 2021-09-11 LAB — CBG MONITORING, ED: Glucose-Capillary: 124 mg/dL — ABNORMAL HIGH (ref 70–99)

## 2021-09-11 MED ORDER — SODIUM CHLORIDE 0.9 % IV SOLN: INTRAVENOUS | Status: DC

## 2021-09-11 MED ORDER — CALCIUM CARBONATE-VITAMIN D 500-200 MG-UNIT PO TABS
1.0000 | ORAL_TABLET | Freq: Every day | ORAL | Status: DC
  Filled 2021-09-11: qty 1

## 2021-09-11 MED ORDER — GLUCOSAMINE-CHONDROITIN 500-400 MG PO TABS: 1.0000 | ORAL_TABLET | Freq: Every day | ORAL | Status: DC

## 2021-09-11 MED ORDER — ESCITALOPRAM OXALATE 10 MG PO TABS
10.0000 mg | ORAL_TABLET | Freq: Every day | ORAL | Status: DC
  Administered 2021-09-11 – 2021-09-12 (×2): 10 mg via ORAL
  Filled 2021-09-11: qty 1

## 2021-09-11 MED ORDER — ATORVASTATIN CALCIUM 10 MG PO TABS
20.0000 mg | ORAL_TABLET | Freq: Every day | ORAL | Status: DC
  Administered 2021-09-12: 20 mg via ORAL
  Filled 2021-09-11: qty 2

## 2021-09-11 MED ORDER — IOHEXOL 350 MG/ML SOLN
40.0000 mL | Freq: Once | INTRAVENOUS | Status: AC | PRN
  Administered 2021-09-11: 40 mL via INTRAVENOUS

## 2021-09-11 MED ORDER — ACETAMINOPHEN 650 MG RE SUPP: 650.0000 mg | RECTAL | Status: DC | PRN

## 2021-09-11 MED ORDER — SENNOSIDES-DOCUSATE SODIUM 8.6-50 MG PO TABS: 1.0000 | ORAL_TABLET | Freq: Every evening | ORAL | Status: DC | PRN

## 2021-09-11 MED ORDER — ALPRAZOLAM 0.25 MG PO TABS: 0.2500 mg | ORAL_TABLET | Freq: Every evening | ORAL | Status: DC | PRN

## 2021-09-11 MED ORDER — ADULT MULTIVITAMIN W/MINERALS CH
1.0000 | ORAL_TABLET | Freq: Every day | ORAL | Status: DC
  Administered 2021-09-11 – 2021-09-13 (×3): 1 via ORAL
  Filled 2021-09-11 (×2): qty 1

## 2021-09-11 MED ORDER — SODIUM CHLORIDE 0.9% FLUSH
3.0000 mL | Freq: Once | INTRAVENOUS | Status: AC
Start: 2021-09-11 — End: 2021-09-11
  Administered 2021-09-11: 3 mL via INTRAVENOUS

## 2021-09-11 MED ORDER — VITAMIN B-12 100 MCG PO TABS
100.0000 ug | ORAL_TABLET | Freq: Every evening | ORAL | Status: DC
  Administered 2021-09-11 – 2021-09-12 (×2): 100 ug via ORAL
  Filled 2021-09-11 (×3): qty 1

## 2021-09-11 MED ORDER — ACETAMINOPHEN 325 MG PO TABS
650.0000 mg | ORAL_TABLET | ORAL | Status: DC | PRN
  Filled 2021-09-11: qty 2

## 2021-09-11 MED ORDER — STROKE: EARLY STAGES OF RECOVERY BOOK
Freq: Once | Status: AC
  Filled 2021-09-11: qty 1

## 2021-09-11 MED ORDER — FLUTICASONE PROPIONATE 50 MCG/ACT NA SUSP: 1.0000 | Freq: Every day | NASAL | Status: DC | PRN

## 2021-09-11 MED ORDER — ACETAMINOPHEN 160 MG/5ML PO SOLN: 650.0000 mg | ORAL | Status: DC | PRN

## 2021-09-11 MED ORDER — TENECTEPLASE FOR STROKE
0.2500 mg/kg | PACK | Freq: Once | INTRAVENOUS | Status: AC
  Administered 2021-09-11: 18 mg via INTRAVENOUS
  Filled 2021-09-11: qty 3.6

## 2021-09-11 MED ORDER — PANTOPRAZOLE SODIUM 40 MG IV SOLR
40.0000 mg | Freq: Every day | INTRAVENOUS | Status: DC
  Administered 2021-09-11 – 2021-09-12 (×2): 40 mg via INTRAVENOUS
  Filled 2021-09-11: qty 40

## 2021-09-11 MED ORDER — IOHEXOL 350 MG/ML SOLN
60.0000 mL | Freq: Once | INTRAVENOUS | Status: AC | PRN
  Administered 2021-09-11: 60 mL via INTRAVENOUS

## 2021-09-11 MED ORDER — LABETALOL HCL 5 MG/ML IV SOLN: 10.0000 mg | INTRAVENOUS | Status: AC | PRN

## 2021-09-11 NOTE — H&P (Addendum)
NEUROLOGY CONSULTATION NOTE   Date of service: September 11, 2021 Patient Name: Jordan Lane MRN:  [id number] DOB:  Aug 28, 1946 _ _ _   _ __   _ __ _ _  __ __   _ __   __ _  History of Present Illness  Jordan Lane is a 75 y.o. female with PMH significant for hypertension, GERD, hyperlipidemia who presents with sudden onset aphasia with a last known well of 1800.  Patient was sitting in a couch watching TV when her speech suddenly would not make sense and she had difficulty comprehending.  Family called EMS and enroute, patient reported to EMS that she takes Eliquis and plavix.   On my evaluation, she has significant aphasia and unable to comprehend. I do not think she can accurately answer the quesiton regarding eliquis intake.   Made best attempt and with the help of our pharmacists reached out to patient's outpatient pharmacy, reviewed insurance records and we spoke to her daughter and went through her med list and she does not use Eliquis. Daugher denies that patient uses any discount cards to get eliquis directly from the manufacturer.  No hx of malignancy, no recent trauma, no CNS surgery in the last couple of months, no MI, no hx of aortic dissection, no hx of ICH, no hx of significant bleeding.  LKW: 1800 mRS: 0 tNK/thrombectomy: daughter [name] consented to tNK administration. Patient's husband has mild dementia per daughter. No LVO, so not a candidate for thrombectomy. NIHSS components Score: Comment  1a Level of Conscious 0[x]  1[]  2[]  3[]      1b LOC Questions 0[]  1[]  2[x]       1c LOC Commands 0[]  1[]  2[x]       2 Best Gaze 0[x]  1[]  2[]       3 Visual 0[x]  1[]  2[]  3[]      4 Facial Palsy 0[x]  1[]  2[]  3[]      5a Motor Arm - left 0[x]  1[]  2[]  3[]  4[]  UN[]    5b Motor Arm - Right 0[x]  1[]  2[]  3[]  4[]  UN[]    6a Motor Leg - Left 0[x]  1[]  2[]  3[]  4[]  UN[]    6b Motor Leg - Right 0[x]  1[]  2[]  3[]  4[]  UN[]    7 Limb Ataxia 0[x]  1[]  2[]  3[]  UN[]     8 Sensory 0[x]  1[]  2[]   UN[]      9 Best Language 0[]  1[]  2[x]  3[]      10 Dysarthria 0[x]  1[]  2[]  UN[]      11 Extinct. and Inattention 0[x]  1[]  2[]       TOTAL: 6      ROS  Unable to obtain secondary to aphasia.  Past History   Past Medical History:  Diagnosis Date  . Arthritis   . Fibroadenoma of breast    Right  . GERD (gastroesophageal reflux disease)   . History of blood transfusion 52 yrs ago  . Hypertension   . Reflux   . Stroke [facility]) 10/2015   Past Surgical History:  Procedure Laterality Date  . ABDOMINAL HYSTERECTOMY  1984   TAH, partial  . CHOLECYSTECTOMY  2009  . ESOPHAGOGASTRODUODENOSCOPY  11/14/2012   Procedure: ESOPHAGOGASTRODUODENOSCOPY (EGD);  Surgeon: Wonda Horner, MD;  Location[facility] ENDOSCOPY;  Service: Endoscopy;  Laterality: N/A;  . ESOPHAGOGASTRODUODENOSCOPY (EGD) WITH PROPOFOL N/A 09/21/2016   Procedure: ESOPHAGOGASTRODUODENOSCOPY (EGD) WITH PROPOFOL;  Surgeon: Garlan Fair, MD;  Location: [facility] ENDOSCOPY;  Service: Endoscopy;  Laterality: N/A;  . ESOPHAGOGASTRODUODENOSCOPY (EGD) WITH PROPOFOL N/A 12/17/2020   Procedure: ESOPHAGOGASTRODUODENOSCOPY (EGD)  WITH PROPOFOL;  Surgeon: Ronald Lobo, MD;  Location: [facility] ENDOSCOPY;  Service: Endoscopy;  Laterality: N/A;  . Herniated disc  1987   lower back   . KNEE SURGERY  left 2010, right 2013   Arthroscopic  . PARTIAL KNEE ARTHROPLASTY  06/14/2012   Procedure: UNICOMPARTMENTAL KNEE;  Surgeon: Mauri Pole, MD;  Location: [facility] ORS;  Service: Orthopedics;  Laterality: Left;  Left Medial Unicompartmental Knee  . PARTIAL KNEE ARTHROPLASTY Right 11/27/2013   Procedure: UNICOMPARTMENTAL RIGHT KNEE, Steroid injection in right great toe;  Surgeon: Mauri Pole, MD;  Location: [facility] ORS;  Service: Orthopedics;  Laterality: Right;   Family History  Problem Relation Age of Onset  . Hypertension Father   . Diabetes Father   . Cancer - Lung Father   . Other Mother        Brain tumor  . Breast cancer Paternal Aunt        Age 71's  . Breast  cancer Paternal Grandmother        Age 23   Social History   Socioeconomic History  . Marital status: Married    Spouse name: Not on file  . Number of children: Not on file  . Years of education: Not on file  . Highest education level: Not on file  Occupational History  . Not on file  Tobacco Use  . Smoking status: Never  . Smokeless tobacco: Never  Vaping Use  . Vaping Use: Never used  Substance and Sexual Activity  . Alcohol use: No  . Drug use: No  . Sexual activity: Yes    Birth control/protection: Surgical  Other Topics Concern  . Not on file  Social History Narrative  . Not on file   Social Determinants of Health   Financial Resource Strain: Not on file  Food Insecurity: Not on file  Transportation Needs: Not on file  Physical Activity: Not on file  Stress: Not on file  Social Connections: Not on file   Allergies  Allergen Reactions  . Codeine Nausea And Vomiting    Medications  (Not in a hospital admission)    Vitals   Vitals:   09/11/21 1850 09/11/21 1900 09/11/21 1930 09/11/21 1940  BP: (!) 180/98 109/86 (!) 174/92 (!) 178/88  Pulse:   79 76  SpO2:   99% 98%  Weight:         Body mass index is 28.51 kg/m.  Physical Exam   General: Laying comfortably in bed; in no acute distress.  HENT: Normal oropharynx and mucosa. Normal external appearance of ears and nose.  Neck: Supple, no pain or tenderness  CV: No JVD. No peripheral edema.  Pulmonary: Symmetric Chest rise. Normal respiratory effort.  Abdomen: Soft to touch, non-tender.  Ext: No cyanosis, edema, or deformity  Skin: No rash. Normal palpation of skin.   Musculoskeletal: Normal digits and nails by inspection. No clubbing.   Neurologic Examination  Mental status/Cognition: Alert, aphasic and does not answer orientation questions.  Speech/language: Non fluent, intermittently can comprehend simple commands. Intermittently able to name some objects. Unable to repeat. Cranial nerves:    CN II Pupils equal and reactive to light, no VF deficits    CN III,IV,VI EOM intact, no gaze preference or deviation, no nystagmus    CN V normal sensation in V1, V2, and V3 segments bilaterally    CN VII no asymmetry, no nasolabial fold flattening    CN VIII normal hearing to speech    CN IX &  X normal palatal elevation, no uvular deviation    CN XI 5/5 head turn and 5/5 shoulder shrug bilaterally    CN XII midline tongue protrusion    Motor:  Muscle bulk: normal, tone normal, pronator drift none tremor none Mvmt Root Nerve  Muscle Right Left Comments  SA C5/6 Ax Deltoid 5 5   EF C5/6 Mc Biceps 5 5   EE C6/7/8 Rad Triceps 5 5   WF C6/7 Med FCR     WE C7/8 PIN ECU     F Ab C8/T1 U ADM/FDI 5 5   HF L1/2/3 Fem Illopsoas 5 5   KE L2/3/4 Fem Quad 5 5   DF L4/5 D Peron Tib Ant 5 5   PF S1/2 Tibial Grc/Sol 5 5    Reflexes:  Right Left Comments  Pectoralis      Biceps (C5/6) 2 2   Brachioradialis (C5/6) 2 2    Triceps (C6/7) 2 2    Patellar (L3/4) 2 2    Achilles (S1)      Hoffman      Plantar     Jaw jerk    Sensation:  Light touch Localizes to pinch in all extremities.   Pin prick    Temperature    Vibration   Proprioception    Coordination/Complex Motor:  - Finger to Nose unable to get her to do but no obvious ataxia. - Heel to shin unable to get her to do but no obvious ataxia. - Rapid alternating movement unable to do - Gait: unsafe to assess given concern for stroke and s/p tNK.  Labs   CBC:  Recent Labs  Lab 09/11/21 1852 09/11/21 1901  WBC 10.3  --   NEUTROABS 5.4  --   HGB 15.1* 15.0  HCT 44.2 44.0  MCV 93.2  --   PLT 286  --     Basic Metabolic Panel:  Lab Results  Component Value Date   NA 141 09/11/2021   K 3.3 (L) 09/11/2021   CO2 29 09/11/2021   GLUCOSE 134 (H) 09/11/2021   BUN 14 09/11/2021   CREATININE 0.90 09/11/2021   CALCIUM 9.9 09/11/2021   GFRNONAA >60 09/11/2021   GFRAA >60 10/21/2015   Lipid Panel:  Lab Results  Component  Value Date   LDLCALC 114 (H) 10/22/2015   HgbA1c:  Lab Results  Component Value Date   HGBA1C 5.9 (H) 10/22/2015   Urine Drug Screen: No results found for: LABOPIA, [place], LABBENZ, AMPHETMU, THCU, LABBARB  Alcohol Level     Component Value Date/Time   ETH <5 10/21/2015 2213    CT Head without contrast: Personally reviewed and CTH was negative for a large hypodensity concerning for a large territory infarct or hyperdensity concerning for an [place]  CT angio Head and Neck with contrast: Personally reviewed ans no LVO on my read.  CT Perfusion: 79mm mismatch in the L frontal lobe and left parieto-occipital region.  MRI Brain: Pending.  Impression   Jordan Lane is a 75 y.o. female with PMH significant for hypertension, GERD, hyperlipidemia who presents with sudden onset aphasia with a NIHSS of 6. CTH w/o contrast with no ICH, no large territory stroke.  Patient initially endorsed to EMS that she takes Eliquis but on my discussion with family, verifying with her pharmacy, reviewing her insurance records, it seems like she does not take eliquis. Given her significant aphasia, I do not think she comrehends when we ask her if she takes eliquis.  tNK discussed with daughter as patient has aphasia and daughter agreed to tNK.  Of note, her CTA does not show an LVO, what is odd thou is that CTP shows a small left frontal and left parieto-occipital mismatch of 33ml, appears watershed. A potential L MCA watershed stroke can cause aphasia. For now, will keep head of bed flat, put her on IV fluids and monitor closely in the ED. Will also get a EEG to evaluate for potential epileptic aphasia.  Primary Diagnosis:  Acute Left MCA stroke. tNK given in the Emergency Department  Secondary Diagnosis: Essential (primary) hypertension  Recommendations   Suspected Acute L MCA stroke or left hemispheric watershed stroke: - Frequent NeuroChecks for post tNK care per stroke unit protocol: -  Initial CTH demonstrated no acute hemorrhage or mass - MRI Brain - pending - CTA - No LVO - CT Perfusion - L MCA/ACA and L MCA/PCA watershed distribution mismatch of 47ml in anterior left frontal and left parieto-occipital lobe. - TTE - pending - Lipid Panel: LDL - pending.  - Statin: if LDL > 70 - HbA1c: pending. - Antithrombotic: Start ASA 81 mg daily if 24 h CTH does not show acute hemorrhage - DVT prophylaxis: SCDs. Pharmacologic prophylaxis if 24 h CTH does not demonstrate acute hemorrhage - Systolic Blood Pressure goal: < 180 mm Hg and DBP < 100. - Telemetry monitoring for arrhythmia: 72 hours - Swallow screen - ordered - PT/OT/SLP consults - given noted small mismatch of uncertain etiology on CTP, will monitor closely in the ICU with head of bed flat, IV fluids and get a routine EEG.  HTN: hold home meds - Post tNK BP goal as above - Labetalol PRN for SBP > 180 or DBP > 100  GERD: - Protonix  HLD: - LDL pending - continue home Atorvastatin.  ______________________________________________________________________  Plan discussed with patient's daughter who is her POA and with patient's husband.  This patient is critically ill and at significant risk of neurological worsening, death and care requires constant monitoring of vital signs, hemodynamics,respiratory and cardiac monitoring, neurological assessment, discussion with family, other specialists and medical decision making of high complexity. I spent 90 minutes of neurocritical care time  in the care of  this patient. This was time spent independent of any time provided by nurse practitioner or PA.  Donnetta Simpers [facility] Pager Number [phone] 09/11/2021  8:34 PM [facility] Pager Number [phone] _ _ _   _ __   _ __ _ _  __ __   _ __   __ _

## 2021-09-11 NOTE — ED Triage Notes (Signed)
Pt BIB [facility] from home as Code Stroke, LKN according to family is 1800. EMS reports difficulty speaking & expressing herself, not able to repeat words. Does have Hx of stroke, BP 180/98, pulse 84, CBG 157.

## 2021-09-11 NOTE — Progress Notes (Signed)
EEG complete - results pending 

## 2021-09-11 NOTE — Progress Notes (Signed)
PHARMACIST CODE STROKE RESPONSE  Notified to mix TNK at 19:21 by Dr. Ellwood Dense Delivered TNK to RN at 7:25  TNK dose = 18 mg IV over 5 seconds.   Issues/delays encountered (if applicable):  -Obtaining consent from family -Traveling back to [place] s/p consent for admixture -Confirmation via pharmacy, insurance records, and family report that patient is not taking Eliquis  Donald Pore 09/11/21 7:27 PM

## 2021-09-11 NOTE — Code Documentation (Signed)
Code Stroke called- Patient arrives from home with [facility]- EMS. LKW 1800 then sudden onset of "confusion and difficulty finding words" per family report. NIHSS 5 upon arrival. History of CVA, HTN and reports being on home Eliquis and Plavix. Due to patient confusion and no records of Eliquis Pharmacy attempting to get more information from outside facility before treatment decision can be made. CT, CTA preformed.

## 2021-09-11 NOTE — ED Provider Notes (Signed)
[place] EMERGENCY DEPARTMENT Provider Note   CSN: [id number] Arrival date & time: 09/11/21  1849     History Chief Complaint  Patient presents with   Code Stroke    Jordan Lane is a 75 y.o. female.  HPI This is a 75 year old female with history of hypertension, prior stroke who presents with word finding difficulty.  Last known normal was 6 PM as reported by patient's husband.  Patient reportedly speaking in sentences and phrases that do not make sense.  No reported slurred speech, facial droop or extremity weakness. Unable to provide additional history due to patients limited speech.     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   Tachycardia 06/14/2012   PVC (premature ventricular contraction) 06/14/2012   HTN (hypertension) 06/14/2012   Reflux    Uterine prolapse    Essential hypertension    Fibroadenoma of breast     Past Surgical History:  Procedure Laterality Date   ABDOMINAL HYSTERECTOMY  1984   TAH, partial   CHOLECYSTECTOMY  2009   ESOPHAGOGASTRODUODENOSCOPY  11/14/2012   Procedure: ESOPHAGOGASTRODUODENOSCOPY (EGD);  Surgeon: Wonda Horner, MD;  Location[facility] ENDOSCOPY;  Service: Endoscopy;  Laterality: N/A;   ESOPHAGOGASTRODUODENOSCOPY (EGD) WITH PROPOFOL N/A 09/21/2016   Procedure: ESOPHAGOGASTRODUODENOSCOPY (EGD) WITH PROPOFOL;  Surgeon: Garlan Fair, MD;  Location: [facility] ENDOSCOPY;  Service: Endoscopy;  Laterality: N/A;    ESOPHAGOGASTRODUODENOSCOPY (EGD) WITH PROPOFOL N/A 12/17/2020   Procedure: ESOPHAGOGASTRODUODENOSCOPY (EGD) WITH PROPOFOL;  Surgeon: Ronald Lobo, MD;  Location: [facility] ENDOSCOPY;  Service: Endoscopy;  Laterality: N/A;   Herniated disc  1987   lower back    KNEE SURGERY  left 2010, right 2013   Arthroscopic   PARTIAL KNEE ARTHROPLASTY  06/14/2012   Procedure: UNICOMPARTMENTAL KNEE;  Surgeon: Mauri Pole, MD;  Location: [facility] ORS;  Service: Orthopedics;  Laterality: Left;  Left Medial Unicompartmental Knee   PARTIAL KNEE ARTHROPLASTY Right 11/27/2013   Procedure: UNICOMPARTMENTAL RIGHT KNEE, Steroid injection in right great toe;  Surgeon: Mauri Pole, MD;  Location: [facility] ORS;  Service: Orthopedics;  Laterality: Right;     OB History     Gravida  3   Para  3   Term  3   Preterm      AB      Living  3      SAB      IAB      Ectopic      Multiple      Live Births              Family History  Problem Relation Age of Onset   Hypertension Father    Diabetes Father    Cancer - Lung Father    Other Mother        Brain tumor   Breast cancer Paternal Aunt        Age  35's   Breast cancer Paternal Grandmother        Age 62    Social History   Tobacco Use   Smoking status: Never   Smokeless tobacco: Never  Vaping Use   Vaping Use: Never used  Substance Use Topics   Alcohol use: No   Drug use: No    Home Medications Prior to Admission medications   Medication Sig Start Date End Date Taking? Authorizing Provider  ALPRAZolam (XANAX) 0.25 MG tablet Take 0.25 mg by mouth at bedtime as needed. 09/13/15   [provider]  amLODipine (NORVASC) 5 MG tablet Take 1 tablet (5 mg total) by mouth daily after breakfast. RESUME AFTER 2 DAYS 10/22/15   Bonnielee Haff, MD  atorvastatin (LIPITOR) 20 MG tablet Take 1 tablet (20 mg total) by mouth daily at 6 PM. 10/23/15   Bonnielee Haff, MD  Calcium Carbonate-Vitamin D (CALCIUM + D PO) Take 1 tablet by mouth daily.      [provider]  cholecalciferol (VITAMIN D) 1000 UNITS tablet Take 1,000 Units by mouth daily.     [provider]  clopidogrel (PLAVIX) 75 MG tablet Take 1 tablet (75 mg total) by mouth daily. 10/23/15   Bonnielee Haff, MD  diphenhydrAMINE (BENADRYL) 25 mg capsule Take 25 mg by mouth every 6 (six) hours as needed for allergies.    [provider]  glucosamine-chondroitin 500-400 MG tablet Take 1 tablet by mouth daily.    [provider]  lidocaine (XYLOCAINE) 5 % ointment Apply 1 application topically as needed. 01/07/16   Rosalin Hawking, MD  Multiple Vitamin (MULTIVITAMIN) tablet Take 1 tablet by mouth daily.    [provider]  naproxen sodium (ANAPROX) 220 MG tablet Take 220 mg by mouth 2 (two) times daily as needed (for pain).    [provider]  omeprazole (PRILOSEC) 40 MG capsule Take 1 capsule (40 mg total) by mouth 2 (two) times daily before a meal. 12/17/20   Hayden Rasmussen, MD  ranitidine (ZANTAC) 150 MG tablet Take 150 mg by mouth 2 (two) times daily.     [provider]  valsartan-hydrochlorothiazide (DIOVAN-HCT) 160-12.5 MG tablet Take 1 tablet by mouth daily after breakfast. RESUME AFTER 2 DAYS. 10/22/15   Bonnielee Haff, MD    Allergies    Codeine  Review of Systems   Review of Systems  Unable to perform ROS: Mental status change   Physical Exam Updated Vital Signs BP (!) 168/74   Pulse 79   Resp 14   Wt 73 kg   SpO2 99%   BMI 28.51 kg/m   Physical Exam Vitals and nursing note reviewed.  Constitutional:      General: She is not in acute distress.    Appearance: She is well-developed.  HENT:     Head: Normocephalic and atraumatic.  Eyes:     Conjunctiva/sclera: Conjunctivae normal.  Cardiovascular:     Rate and Rhythm: Normal rate and regular rhythm.     Heart sounds: No murmur heard. Pulmonary:     Effort: Pulmonary effort is normal. No respiratory distress.     Breath sounds: Normal breath  sounds.  Abdominal:     Palpations: Abdomen is soft.     Tenderness: There is no abdominal tenderness.  Musculoskeletal:     Cervical back: Neck supple.  Skin:    General: Skin is warm and dry.  Neurological:     Mental Status: She is alert.     Cranial Nerves:  No dysarthria or facial asymmetry.     Motor: Motor function is intact.     Comments: Full strength in all extremities. No dysarthria but has some expressive and receptive aphasia. Difficulty following commands.     ED Results / Procedures / Treatments   Labs (all labs ordered are listed, but only abnormal results are displayed) Labs Reviewed  CBC - Abnormal; Notable for the following components:      Result Value   Hemoglobin 15.1 (*)    All other components within normal limits  COMPREHENSIVE METABOLIC PANEL - Abnormal; Notable for the following components:   Glucose, Bld 139 (*)    All other components within normal limits  I-STAT CHEM 8, ED - Abnormal; Notable for the following components:   Potassium 3.3 (*)    Glucose, Bld 134 (*)    All other components within normal limits  CBG MONITORING, ED - Abnormal; Notable for the following components:   Glucose-Capillary 124 (*)    All other components within normal limits  PROTIME-INR  APTT  DIFFERENTIAL    EKG EKG Interpretation  Date/Time:  Thursday September 11 2021 19:43:16 EDT Ventricular Rate:  70 PR Interval:  160 QRS Duration: 141 QT Interval:  431 QTC Calculation: 466 R Axis:   152 Text Interpretation: Sinus or ectopic atrial rhythm Nonspecific intraventricular conduction delay Nonspecific T wave abnormality Confirmed by Lajean Saver [phone]) on 09/11/2021 7:47:09 PM  Radiology No results found.  Procedures Procedures   Medications Ordered in ED Medications  sodium chloride flush (NS) 0.9 % injection 3 mL (3 mLs Intravenous Given 09/11/21 1926)  iohexol (OMNIPAQUE) 350 MG/ML injection 60 mL (60 mLs Intravenous Contrast Given 09/11/21 1907)   tenecteplase (TNKASE) injection for Stroke 18 mg (18 mg Intravenous Given 09/11/21 1926)  iohexol (OMNIPAQUE) 350 MG/ML injection 40 mL (40 mLs Intravenous Contrast Given 09/11/21 1920)    ED Course  I have reviewed the triage vital signs and the nursing notes.  Pertinent labs & imaging results that were available during my care of the patient were reviewed by me and considered in my medical decision making (see chart for details).    MDM Rules/Calculators/A&P                          On arrival patient is hemodynamically stable and well-appearing.  Evaluated as a code stroke so neurology at bedside on arrival.  Last known normal 1 hour prior to arrival. POC glucose 124. Taken directly to the scanner where patient was found to have an area of hypoperfusion but no clear clot.  No evidence of LVO. Given tenecteplase per neurology.  Consent given by patient's husband over the phone.  Additional labs including CBC, CMP, and coags are unremarkable. Patient admitted to neuro ICU for further monitoring and evaluation.   Admitted in stable condition.   Final Clinical Impression(s) / ED Diagnoses Final diagnoses:  Acute ischemic stroke [facility])  Aphasia    Rx / DC Orders ED Discharge Orders     None        Coralee Pesa, MD 09/11/21 Cas[place] Lanius    Lajean Saver, MD 09/15/21 [phone]

## 2021-09-12 ENCOUNTER — Inpatient Hospital Stay (HOSPITAL_COMMUNITY): Payer: Medicare HMO

## 2021-09-12 ENCOUNTER — Inpatient Hospital Stay (HOSPITAL_COMMUNITY): Payer: Medicare HMO | Attending: Neurology

## 2021-09-12 DIAGNOSIS — I63512 Cerebral infarction due to unspecified occlusion or stenosis of left middle cerebral artery: Secondary | ICD-10-CM

## 2021-09-12 DIAGNOSIS — R569 Unspecified convulsions: Secondary | ICD-10-CM

## 2021-09-12 LAB — LIPID PANEL
Cholesterol: 125 mg/dL (ref 0–200)
HDL: 48 mg/dL (ref >40)
LDL Cholesterol: 45 mg/dL (ref 0–99)
Total CHOL/HDL Ratio: 2.6 RATIO
Triglycerides: 160 mg/dL — ABNORMAL HIGH (ref <150)
VLDL: 32 mg/dL (ref 0–40)

## 2021-09-12 LAB — MRSA NEXT GEN BY PCR, NASAL: MRSA by PCR Next Gen: NOT DETECTED

## 2021-09-12 LAB — ECHOCARDIOGRAM COMPLETE
Area-P 1/2: 2.92 cm2
S' Lateral: 2.3 cm
Weight: 2574.97 oz

## 2021-09-12 LAB — HEMOGLOBIN A1C
Hgb A1c MFr Bld: 5.3 % (ref 4.8–5.6)
Mean Plasma Glucose: 105.41 mg/dL

## 2021-09-12 MED ORDER — IRBESARTAN 150 MG PO TABS
150.0000 mg | ORAL_TABLET | Freq: Every day | ORAL | Status: DC
  Administered 2021-09-13: 150 mg via ORAL
  Filled 2021-09-12 (×2): qty 1

## 2021-09-12 MED ORDER — CHLORHEXIDINE GLUCONATE CLOTH 2 % EX PADS: 6.0000 | MEDICATED_PAD | Freq: Every day | CUTANEOUS | Status: DC

## 2021-09-12 MED ORDER — LORAZEPAM 2 MG/ML IJ SOLN
1.0000 mg | Freq: Once | INTRAMUSCULAR | Status: AC
  Administered 2021-09-12: 1 mg via INTRAVENOUS
  Filled 2021-09-12: qty 1

## 2021-09-12 MED ORDER — VITAMIN D 25 MCG (1000 UNIT) PO TABS
1000.0000 [IU] | ORAL_TABLET | Freq: Every day | ORAL | Status: DC
  Administered 2021-09-13: 1000 [IU] via ORAL
  Filled 2021-09-12: qty 1

## 2021-09-12 NOTE — Progress Notes (Addendum)
STROKE TEAM PROGRESS NOTE   INTERVAL HISTORY S/p tNK Her daughter and husband are at bedside. She reports onset of trouble talking with stuttering yesterday lasting until ICU admission with steadily improving over time. Today her speech is clear and functionally fluent and she is able to name and repeat without difficulty. She was able name 7 animals in one minute. She may have higher level word finding difficulty. Improved overnight. Now essentially resolved.  We discussed her stroke history, current history, ongoing work up and plan of care. Continues to deny atrial fibrillation or Eliquis history. She reports history of difficulty tolerating MRI d/t claustrophobia. She is agreeing to try with sedation.  CT scan of the head as well as CT angiograms were unremarkable.  CT perfusion showed a small defect of 7 ml of unclear significance. Vitals:   09/12/21 0400 09/12/21 0500 09/12/21 0600 09/12/21 0700  BP: (!) 150/82 (!) 152/60 (!) 145/78 (!) 147/75  Pulse: 73 68 67 67  Resp: 12 17 (!) 21 15  Temp: 98.3 F (36.8 C)     TempSrc: Oral     SpO2: 95% 93% (!) 87% 91%  Weight:       CBC:  Recent Labs  Lab 09/11/21 1852 09/11/21 1901  WBC 10.3  --   NEUTROABS 5.4  --   HGB 15.1* 15.0  HCT 44.2 44.0  MCV 93.2  --   PLT 286  --    Basic Metabolic Panel:  Recent Labs  Lab 09/11/21 1852 09/11/21 1901  NA 139 141  K 3.5 3.3*  CL 100 100  CO2 29  --   GLUCOSE 139* 134*  BUN 13 14  CREATININE 0.89 0.90  CALCIUM 9.9  --    Lipid Panel:  Recent Labs  Lab 09/12/21 0403  CHOL 125  TRIG 160*  HDL 48  CHOLHDL 2.6  VLDL 32  LDLCALC 45   HgbA1c:  Recent Labs  Lab 09/12/21 0403  HGBA1C 5.3   Urine Drug Screen: No results for input(s): LABOPIA, COCAINSCRNUR, LABBENZ, AMPHETMU, THCU, LABBARB in the last 168 hours.  Alcohol Level No results for input(s): ETH in the last 168 hours.  IMAGING past 24 hours CT CEREBRAL PERFUSION W CONTRAST  Result Date: 09/11/2021 CLINICAL  DATA:  Neuro deficit, acute, stroke suspected EXAM: CT PERFUSION BRAIN TECHNIQUE: Multiphase CT imaging of the brain was performed following IV bolus contrast injection. Subsequent parametric perfusion maps were calculated using RAPID software. CONTRAST:  107mL OMNIPAQUE IOHEXOL 350 MG/ML SOLN COMPARISON:  Same day CT exams. FINDINGS: CT Brain Perfusion Findings: CBF (<30%) Volume: 54mL Perfusion (Tmax>6.0s) volume: 60mL Mismatch Volume: 10mL, located in the anterior left frontal lobe and left parieto-occipital region. Mildly increased Tmax (>4 seconds) in the left cerebral hemisphere. ASPECTS on noncontrast CT Head: 10 earlier today. Infarct Core: 0 mL Infarction Location:None IMPRESSION: RAPID detects mildly increased Tmax in the left cerebral hemisphere with small volume (7 mL) mismatch/penumbra in the anterior left frontal lobe and the left parieto-occipital region. No core infarct detected. Findings are indeterminate, but could potentially represent watershed type ischemia on the left. MRI could better evaluate for acute infarct if clinically indicated. Findings discussed with Dr. Milas Gain via telephone at 7:30 p.m. Electronically Signed   By: Margaretha Sheffield M.D.   On: 09/11/2021 19:38   CT HEAD CODE STROKE WO CONTRAST  Result Date: 09/11/2021 CLINICAL DATA:  Code stroke.  Difficulty speaking EXAM: CT HEAD WITHOUT CONTRAST TECHNIQUE: Contiguous axial images were obtained from the base of  the skull through the vertex without intravenous contrast. COMPARISON:  None. FINDINGS: Brain: There is no mass, hemorrhage or extra-axial collection. The size and configuration of the ventricles and extra-axial CSF spaces are normal. There is hypoattenuation of the periventricular white matter, most commonly indicating chronic ischemic microangiopathy. Vascular: No abnormal hyperdensity of the major intracranial arteries or dural venous sinuses. No intracranial atherosclerosis. Skull: The visualized skull base, calvarium and  extracranial soft tissues are normal. Sinuses/Orbits: No fluid levels or advanced mucosal thickening of the visualized paranasal sinuses. No mastoid or middle ear effusion. The orbits are normal. ASPECTS [facility] Stroke Program Early CT Score) - Ganglionic level infarction (caudate, lentiform nuclei, internal capsule, insula, M1-M3 cortex): 7 - Supraganglionic infarction (M4-M6 cortex): 3 Total score (0-10 with 10 being normal): 10 IMPRESSION: 1. Chronic ischemic microangiopathy without acute intracranial abnormality. 2. ASPECTS is 10. These results were called by telephone at the time of interpretation on 09/11/2021 at 7:08 pm to provider [facility] , who verbally acknowledged these results. Electronically Signed   By: Ulyses Jarred M.D.   On: 09/11/2021 19:11   CT ANGIO HEAD CODE STROKE  Result Date: 09/11/2021 CLINICAL DATA:  Aphasia EXAM: CT ANGIOGRAPHY HEAD AND NECK TECHNIQUE: Multidetector CT imaging of the head and neck was performed using the standard protocol during bolus administration of intravenous contrast. Multiplanar CT image reconstructions and MIPs were obtained to evaluate the vascular anatomy. Carotid stenosis measurements (when applicable) are obtained utilizing NASCET criteria, using the distal internal carotid diameter as the denominator. CONTRAST:  29mL OMNIPAQUE IOHEXOL 350 MG/ML SOLN COMPARISON:  None. FINDINGS: CTA NECK FINDINGS SKELETON: There is no bony spinal canal stenosis. No lytic or blastic lesion. OTHER NECK: Normal pharynx, larynx and major salivary glands. No cervical lymphadenopathy. Unremarkable thyroid gland. UPPER CHEST: No pneumothorax or pleural effusion. No nodules or masses. AORTIC ARCH: There is calcific atherosclerosis of the aortic arch. There is no aneurysm, dissection or hemodynamically significant stenosis of the visualized portion of the aorta. Conventional 3 vessel aortic branching pattern. The visualized proximal subclavian arteries are widely patent.  RIGHT CAROTID SYSTEM: Normal without aneurysm, dissection or stenosis. LEFT CAROTID SYSTEM: Normal without aneurysm, dissection or stenosis. VERTEBRAL ARTERIES: Left dominant configuration. Both origins are clearly patent. There is no dissection, occlusion or flow-limiting stenosis to the skull base (V1-V3 segments). CTA HEAD FINDINGS POSTERIOR CIRCULATION: --Vertebral arteries: Normal V4 segments. --Inferior cerebellar arteries: Normal. --Basilar artery: Normal. --Superior cerebellar arteries: Normal. --Posterior cerebral arteries (PCA): Normal. ANTERIOR CIRCULATION: --Intracranial internal carotid arteries: Normal. --Anterior cerebral arteries (ACA): Normal. Both A1 segments are present. Patent anterior communicating artery (a-comm). --Middle cerebral arteries (MCA): Normal. VENOUS SINUSES: As permitted by contrast timing, patent. ANATOMIC VARIANTS: None Review of the MIP images confirms the above findings. IMPRESSION: Normal CTA of the head and neck. Aortic atherosclerosis (ICD10-I70.0). Electronically Signed   By: Ulyses Jarred M.D.   On: 09/11/2021 19:23   CT ANGIO NECK CODE STROKE  Result Date: 09/11/2021 CLINICAL DATA:  Aphasia EXAM: CT ANGIOGRAPHY HEAD AND NECK TECHNIQUE: Multidetector CT imaging of the head and neck was performed using the standard protocol during bolus administration of intravenous contrast. Multiplanar CT image reconstructions and MIPs were obtained to evaluate the vascular anatomy. Carotid stenosis measurements (when applicable) are obtained utilizing NASCET criteria, using the distal internal carotid diameter as the denominator. CONTRAST:  72mL OMNIPAQUE IOHEXOL 350 MG/ML SOLN COMPARISON:  None. FINDINGS: CTA NECK FINDINGS SKELETON: There is no bony spinal canal stenosis. No lytic or blastic lesion. OTHER NECK:  Normal pharynx, larynx and major salivary glands. No cervical lymphadenopathy. Unremarkable thyroid gland. UPPER CHEST: No pneumothorax or pleural effusion. No nodules or  masses. AORTIC ARCH: There is calcific atherosclerosis of the aortic arch. There is no aneurysm, dissection or hemodynamically significant stenosis of the visualized portion of the aorta. Conventional 3 vessel aortic branching pattern. The visualized proximal subclavian arteries are widely patent. RIGHT CAROTID SYSTEM: Normal without aneurysm, dissection or stenosis. LEFT CAROTID SYSTEM: Normal without aneurysm, dissection or stenosis. VERTEBRAL ARTERIES: Left dominant configuration. Both origins are clearly patent. There is no dissection, occlusion or flow-limiting stenosis to the skull base (V1-V3 segments). CTA HEAD FINDINGS POSTERIOR CIRCULATION: --Vertebral arteries: Normal V4 segments. --Inferior cerebellar arteries: Normal. --Basilar artery: Normal. --Superior cerebellar arteries: Normal. --Posterior cerebral arteries (PCA): Normal. ANTERIOR CIRCULATION: --Intracranial internal carotid arteries: Normal. --Anterior cerebral arteries (ACA): Normal. Both A1 segments are present. Patent anterior communicating artery (a-comm). --Middle cerebral arteries (MCA): Normal. VENOUS SINUSES: As permitted by contrast timing, patent. ANATOMIC VARIANTS: None Review of the MIP images confirms the above findings. IMPRESSION: Normal CTA of the head and neck. Aortic atherosclerosis (ICD10-I70.0). Electronically Signed   By: Ulyses Jarred M.D.   On: 09/11/2021 19:23    PHYSICAL EXAM Pleasant elderly Caucasian lady not in distress. . Afebrile. Head is nontraumatic. Neck is supple without bruit.    Cardiac exam no murmur or gallop. Lungs are clear to auscultation. Distal pulses are well felt.  Neurological Exam ;  Awake  Alert oriented x 3. Normal speech and language.diminished recall 1/3.  Diminished animal naming 7 only.  No paraphasic errors.  Good comprehension and repetition.  Eye movements full without nystagmus.fundi were not visualized. Vision acuity and fields appear normal. Hearing is normal. Palatal movements are  normal. Face symmetric. Tongue midline. Normal strength, tone, reflexes and coordination. Normal sensation. Gait deferred.  ASSESSMENT/PLAN Jordan Lane is a 75 y.o. female with PMH significant for hypertension, GERD, hyperlipidemia who presents with sudden onset aphasia with a last known well of 1800.  Patient was sitting in a couch watching TV when her speech suddenly would not make sense and she had difficulty comprehending.  Family called EMS and enroute, patient reported to EMS that she takes Eliquis and plavix. This was later found to be inaccurate.  After much investigation it became apparent patient only takes plavix and has never taken Eliquis.   On neurology evaluation, she had significant expressive and receptive aphasia. Consent for tNK was obtained and it was administered.    Stroke like episode consisting of expressive and receptive aphasia lasting several hours, essentially resolved s/p tNK administration .        CT Head without contrast: 1. Chronic ischemic microangiopathy without acute intracranial abnormality. 2. ASPECTS is 10.      CT angio Head and Neck with contrast: No LVO       CT Perfusion: 71mm mismatch in the L frontal lobe and left parieto-occipital region MRI  PENDING 2D Echo PENDING      EEG This study is suggestive of cortical dysfunction in left hemisphere, maximal left temporal region nonspecific etiology but could be secondary to underlying structural abnormality, postictal state. No seizures or epileptiform discharges were seen throughout the recording.  LDL 45 HgbA1c 5.3 VTE prophylaxis - SCDs    There are no active orders of the following types: Diet, Nourishments.   On Plavix prior to admission On tNK precautions. No AC/AP for the present, awaiting MRI Therapy recommendations:  PENDING Disposition:  TBD  Hypertension Home meds:  Diovan HCT 160-25mg , amlodipine 5mg   Stable Permissive hypertension (OK if < 220/120) but gradually normalize  in 5-7 days Long-term BP goal normotensive  Hyperlipidemia Home meds:  lipitor 20  LDL 45, at goal < 70 High intensity statin not indicated due to already at goal Continue statin at discharge      Possible Seizure EEG: no epileptiform discharges but left temporal slowing   Other Stroke Risk Factors Advanced Age >/= 32  Overweight, Body mass index is 28.51 kg/m., BMI >/= 30 associated with increased stroke risk, recommend weight loss, diet and exercise as appropriate  Hx stroke/TIA  Other Active Problems   Hospital day # 1  This patient was seen and evaluated with Dr. Leonie Man. He directed the plan of care.  Charlene Brooke, NP-C   STROKE MD NOTE :  I have personally obtained history,examined this patient, reviewed notes, independently viewed imaging studies, participated in medical decision making and plan of care.ROS completed by me personally and pertinent positives fully documented  I have made any additions or clarifications directly to the above note. Agree with note above.  Patient presented with sudden onset of expressive and receptive aphasia which lasted at least an hour and resolved after receiving IV TNKase likely left hemispheric TIA versus small infarct.  Continue close neurological observation and strict blood pressure control as per postthrombolytic protocol.  Check MRI scan of the brain later today as well as continue ongoing stroke work-up.  Continue telemetry monitoring.  Long discussion with patient and husband at the bedside and answered questions.This patient is critically ill and at significant risk of neurological worsening, death and care requires constant monitoring of vital signs, hemodynamics,respiratory and cardiac monitoring, extensive review of multiple databases, frequent neurological assessment, discussion with family, other specialists and medical decision making of high complexity.I have made any additions or clarifications directly to the above  note.This critical care time does not reflect procedure time, or teaching time or supervisory time of PA/NP/Med Resident etc but could involve care discussion time.  I spent 30 minutes of neurocritical care time  in the care of  this patient.      Antony Contras, MD Medical Director [facility] Pager: [phone] 09/12/2021 2:46 PM  To contact Stroke Continuity provider, please refer to [email or web address] After hours, contact General Neurology

## 2021-09-12 NOTE — Progress Notes (Signed)
LTM EEG hooked up and running - no initial skin breakdown - push button tested - neuro notified. [facility] monitoring.  

## 2021-09-12 NOTE — Progress Notes (Signed)
Patient with new onset expressive aphasia upon return from MRI.  Spoke with dr Lorrin Goodell, no evidence of stroke on MRI.  Patient did receive 1mg  IV ativan for claustrophobia. Plan for a continuous EEG.  Patients resting comfortably VSS.  Will continue to monitor.

## 2021-09-12 NOTE — Progress Notes (Signed)
OT Cancellation Note  Patient Details Name: Jordan Lane MRN: [id number] DOB: [birth year]   Cancelled Treatment:    Reason Eval/Treat Not Completed: Active bedrest order  [place], OT/L   Acute OT Clinical Specialist Acute Rehabilitation Services Pager [phone] Office [phone]  09/12/2021, 7:03 AM

## 2021-09-12 NOTE — Procedures (Signed)
Patient Name: Jordan Lane  MRN: [id number]  Epilepsy Attending: Lora Havens  Referring Physician/Provider: Dr Donnetta Simpers Date: 09/11/2021 Duration: 23.59 mins  Patient history: 75 year old female with sudden onset aphasia.  EEG to evaluate for seizures.  Level of alertness: Awake  AEDs during EEG study: None  Technical aspects: This EEG study was done with scalp electrodes positioned according to the 10-20 International system of electrode placement. Electrical activity was acquired at a sampling rate of 500Hz  and reviewed with a high frequency filter of 70Hz  and a low frequency filter of 1Hz . EEG data were recorded continuously and digitally stored.   Description: The posterior dominant rhythm consists of 7.5 Hz activity of moderate voltage (25-35 uV) seen predominantly in posterior head regions, symmetric and reactive to eye opening and eye closing. EEG also showed intermittent 2 to 3 Hz delta slowing in left hemisphere, maximal left temporal region. Hyperventilation and photic stimulation were not performed.     ABNORMALITY -Intermittent slow, left hemisphere, maximal left temporal region  IMPRESSION: This study is suggestive of cortical dysfunction in left hemisphere, maximal left temporal region nonspecific etiology but could be secondary to underlying structural abnormality, postictal state.  No seizures or epileptiform discharges were seen throughout the recording.  Jordan Lane Jordan Lane

## 2021-09-12 NOTE — Evaluation (Signed)
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   Tachycardia 06/14/2012   PVC (premature ventricular contraction) 06/14/2012   HTN (hypertension) 06/14/2012   Reflux    Uterine prolapse    Essential hypertension    Fibroadenoma of breast    Past Medical History:  Past Medical History:  Diagnosis Date   Arthritis    Fibroadenoma of breast    Right   GERD (gastroesophageal reflux disease)    History of blood transfusion 52 yrs ago   Hypertension    Reflux    Stroke ([place]) 10/2015   Past Surgical History:  Past Surgical History:  Procedure Laterality Date   ABDOMINAL HYSTERECTOMY  1984   TAH, partial   CHOLECYSTECTOMY  2009   ESOPHAGOGASTRODUODENOSCOPY  11/14/2012   Procedure: ESOPHAGOGASTRODUODENOSCOPY (EGD);  Surgeon: Wonda Horner, MD;  Location: [facility] ENDOSCOPY;  Service: Endoscopy;  Laterality: N/A;   ESOPHAGOGASTRODUODENOSCOPY (EGD) WITH PROPOFOL N/A 09/21/2016   Procedure: ESOPHAGOGASTRODUODENOSCOPY (EGD) WITH PROPOFOL;  Surgeon: Garlan Fair, MD;  Location: [facility] ENDOSCOPY;  Service: Endoscopy;  Laterality: N/A;   ESOPHAGOGASTRODUODENOSCOPY (EGD) WITH PROPOFOL N/A 12/17/2020   Procedure: ESOPHAGOGASTRODUODENOSCOPY (EGD) WITH PROPOFOL;  Surgeon: Ronald Lobo, MD;  Location: [facility] ENDOSCOPY;  Service: Endoscopy;  Laterality: N/A;   Herniated disc  1987   lower back    KNEE SURGERY  left 2010, right 2013   Arthroscopic   PARTIAL KNEE ARTHROPLASTY  06/14/2012   Procedure:  UNICOMPARTMENTAL KNEE;  Surgeon: Mauri Pole, MD;  Location: [facility] ORS;  Service: Orthopedics;  Laterality: Left;  Left Medial Unicompartmental Knee   PARTIAL KNEE ARTHROPLASTY Right 11/27/2013   Procedure: UNICOMPARTMENTAL RIGHT KNEE, Steroid injection in right great toe;  Surgeon: Mauri Pole, MD;  Location: [facility] ORS;  Service: Orthopedics;  Laterality: Right;   HPI:      Assessment / Plan / Recommendation Clinical Impression       SLP Assessment       Recommendations for follow up therapy are one component of a multi-disciplinary discharge planning process, led by the attending physician.  Recommendations may be updated based on patient status, additional functional criteria and insurance authorization.    Follow Up Recommendations       Frequency and Duration           SLP Evaluation Cognition          Comprehension       Expression     Oral / Motor      GO                    Houston Siren 09/12/2021, 4:34 PM

## 2021-09-12 NOTE — Evaluation (Signed)
Physical Therapy Evaluation Patient Details Name: Jordan Lane MRN: [id number] DOB: 1946-10-18 Today's Date: 09/12/2021  History of Present Illness  Jordan Lane is a 75 y.o. female with PMH significant for hypertension, GERD, hyperlipidemia, breast CA, bil TKR who presents with sudden onset aphasia thought to have L MCA distribution watershed CVA but MRI pending,  tNK was administered in ED.  Clinical Impression  Patient presents close to her baseline for mobility.  History of bilateral TKA and reports needs a hip replacement.  Today needed hand hold assist for ambulation in hallway.  She has needed equipment at home.  PT to follow up to ensure safety on steps for home entry.  Will not need follow up PT at d/c.        Recommendations for follow up therapy are one component of a multi-disciplinary discharge planning process, led by the attending physician.  Recommendations may be updated based on patient status, additional functional criteria and insurance authorization.  Follow Up Recommendations No PT follow up    Equipment Recommendations  None recommended by PT    Recommendations for Other Services       Precautions / Restrictions Precautions Precautions: Fall      Mobility  Bed Mobility Overal bed mobility: Modified Independent                  Transfers Overall transfer level: Needs assistance Equipment used: None Transfers: Sit to/from Stand Sit to Stand: Min guard         General transfer comment: assist for lines and to steady as up on her feet the first time  Ambulation/Gait Ambulation/Gait assistance: Min assist Gait Distance (Feet): 150 Feet (& 12') Assistive device: 1 person hand held assist Gait Pattern/deviations: Step-through pattern;Decreased stride length     General Gait Details: good pace when walking in hallway holding her spouse's hand, in room to bathroom with PT HHA, reports some L hip pain (needs hip replacement)  Stairs             Wheelchair Mobility    Modified Rankin (Stroke Patients Only) Modified Rankin (Stroke Patients Only) Pre-Morbid Rankin Score: No significant disability Modified Rankin: Moderate disability     Balance Overall balance assessment: Needs assistance   Sitting balance-Leahy Scale: Good       Standing balance-Leahy Scale: Good Standing balance comment: washing hands at sink unsupported, HHA for ambulation for balance                             Pertinent Vitals/Pain Pain Assessment: No/denies pain    Home Living Family/patient expects to be discharged to:: Private residence Living Arrangements: Spouse/significant other Available Help at Discharge: Family Type of Home: House Home Access: Stairs to enter Entrance Stairs-Rails: Chemical engineer of Steps: 3 Home Layout: Two level;Able to live on main level with bedroom/bathroom Home Equipment: Shower seat;Grab bars - tub/shower;Hand held shower head;Walker - 2 wheels Additional Comments: equipment in attic or at her daughters    Prior Function Level of Independence: Independent               Hand Dominance   Dominant Hand: Right    Extremity/Trunk Assessment        Lower Extremity Assessment Lower Extremity Assessment: Overall WFL for tasks assessed       Communication   Communication: No difficulties  Cognition Arousal/Alertness: Awake/alert Behavior During Therapy: WFL for tasks assessed/performed Overall Cognitive Status: Within  Functional Limits for tasks assessed                                        General Comments General comments (skin integrity, edema, etc.): reviewed BE FAST stroke warning signs and risk factor modification with pt    Exercises     Assessment/Plan    PT Assessment Patient needs continued PT services  PT Problem List Decreased mobility;Decreased balance;Decreased activity tolerance;Decreased strength       PT  Treatment Interventions DME instruction;Therapeutic activities;Gait training;Stair training;Functional mobility training;Patient/family education    PT Goals (Current goals can be found in the Care Plan section)  Acute Rehab PT Goals Patient Stated Goal: to return to indepenent PT Goal Formulation: With patient/family Time For Goal Achievement: 09/19/21 Potential to Achieve Goals: Good    Frequency Min 4X/week   Barriers to discharge        Co-evaluation               AM-PAC PT "6 Clicks" Mobility  Outcome Measure Help needed turning from your back to your side while in a flat bed without using bedrails?: None Help needed moving from lying on your back to sitting on the side of a flat bed without using bedrails?: None Help needed moving to and from a bed to a chair (including a wheelchair)?: A Little Help needed standing up from a chair using your arms (e.g., wheelchair or bedside chair)?: A Little Help needed to walk in hospital room?: A Little Help needed climbing 3-5 steps with a railing? : A Little 6 Click Score: 20    End of Session   Activity Tolerance: Patient tolerated treatment well Patient left: in chair;with call bell/phone within reach;with chair alarm set;with family/visitor present   PT Visit Diagnosis: Other abnormalities of gait and mobility (R26.89);Muscle weakness (generalized) (M62.81)    Time: 4782-9562 PT Time Calculation (min) (ACUTE ONLY): 28 min   Charges:   PT Evaluation $PT Eval Low Complexity: 1 Low PT Treatments $Gait Training: 8-22 mins        Magda Kiel, PT Acute Rehabilitation Services ZHYQM:[phone] [phone] 09/12/2021   Reginia Naas 09/12/2021, 1:34 PM

## 2021-09-12 NOTE — Progress Notes (Signed)
Echocardiogram 2D Echocardiogram has been performed.  Oneal Deputy Keino Placencia RDCS 09/12/2021, 1:12 PM

## 2021-09-12 NOTE — Progress Notes (Signed)
Brief Neuro Note:  Was notified of recurrence of aphasia after Ativan and MRI Brain. Reviewed her MRI Brain which is negative for an acute stroke. I do not think that this is stroke, very unusual for stroke to cause intermittent recurring aphasia. Low concern for this being seizure given this started after ativan. I would except ativan to help with a seizure, not trigger or cause a seizure. This could also be behavioral episode.  Given that the episode of aphasia is happening so frequently, I will put her up on cEEG overnight to see if there are any epileptiform discharges or if we can capture an event.  [place] Pager Number [phone]

## 2021-09-12 NOTE — Progress Notes (Signed)
PT Cancellation Note  Patient Details Name: Jordan Lane MRN: [id number] DOB: Aug 23, 1946   Cancelled Treatment:    Reason Eval/Treat Not Completed: Active bedrest order   Reginia Naas 09/12/2021, 9:06 AM Magda Kiel, PT Acute Rehabilitation Services [phone] [phone] 09/12/2021

## 2021-09-12 NOTE — Progress Notes (Signed)
Occupational Therapy Evaluation  PTA pt lives independently with her husband, drives and manages her husband's insulin. Pt demonstrated occasional work finding deficits during session. Scored an 8 on the Short Blessed Test which places her in the impaired category (normal 0-4; impaired 5-9; significant impairment over 10). Demonstrated difficulty with problem solving and decreased STM. Pt states that she was only able to see "the left part of her TV when this first happened". Pt identified targets during confrontation testing however may benefit from a full field visual assessment by her eye doctor to objectively assess visual fields to help determine her ability to return to driving. Daughter/husband present during session and state that although Jordan Lane has improved, she is not at her baseline. Recommend follow up with OT at the neuro outpt center to maximize functional level of independence with IADL tasks. Pt/family verbalized understanding. BeFast reviewed. Will follow acutely.    09/12/21 1600  OT Visit Information  Last OT Received On 09/12/21  Assistance Needed +1  History of Present Illness Jordan Lane is a 75 y.o. female with PMH significant for hypertension, GERD, hyperlipidemia, breast CA, bil TKR who presents with sudden onset aphasia thought to have L MCA distribution watershed CVA but MRI pending,  tNK was administered in ED.  Precautions  Precautions Fall  Home Living  Family/patient expects to be discharged to: Private residence  Living Arrangements Spouse/significant other  Available Help at Discharge Family  Type of [place] to enter  Entrance Stairs-Number of Steps 3  [place] Two level;Able to live on main level with bedroom/bathroom  Bathroom Biomedical scientist Yes  How Accessible Accessible via walker  Spangle seat;Grab bars -  tub/shower;Hand held shower head;Walker - 2 wheels  Additional Comments equipment in attic or at her daughters   Lives With Spouse;Family (can assist at DC)  Prior Function  Level of Independence Independent  Comments drives; does her husband's medication managment  Communication  Communication Expressive difficulties (occasionally word finding and wrod substitution noted)  Pain Assessment  Pain Assessment Faces  Faces Pain Scale 2  Pain Location R hip  Pain Descriptors / Indicators Discomfort  Pain Intervention(s) Limited activity within patient's tolerance  Cognition  Arousal/Alertness Awake/alert  Behavior During Therapy WFL for tasks assessed/performed  Overall Cognitive Status Impaired/Different from baseline  Area of Impairment Memory;Safety/judgement;Awareness;Problem solving  Memory Decreased short-term memory  Safety/Judgement Decreased awareness of deficits  Awareness Emergent  Problem Solving Slow processing  General Comments Scored an 8 on the Short Blessed Test of memory and concentration which places her in the impaired category; unable to state months in reverse order;would get lost regarding what month she was saying then start with a different month - unable to complete; recalled 3/5 wors on delayed recall; able to copmlete sequential subtraction  Upper Extremity Assessment  Upper Extremity Assessment Overall WFL for tasks assessed  Cervical / Trunk Assessment  Cervical / Trunk Assessment Normal  ADL  Overall ADL's  Needs assistance/impaired  Functional mobility during ADLs Min guard  General ADL Comments Able to complete basic ADL tasks with set up/S. Recommend minguard A with tub/shower transfer and use of showerchair. Daughter verbalizes understanding.  Vision- History  Baseline Vision/History 1 Wears glasses  Patient Visual Report Blurring of vision  Vision- Assessment  Vision Assessment? Yes  Eye Alignment [facility]  Ocular Range of Motion [facility]  Alignment/Gaze  Preference WDL  Tracking/Visual  Pursuits Able to track stimulus in all quads without difficulty  Saccades [facility]  Convergence [facility]  Visual Fields Other (comment) (complained of not being able to see initially on the R)  Additional Comments May benefit from further visual field assessment  Perception  Comments unable to complete clock draw - numbers not spaced correctly; unable to place hands on clock; unable to problem solve and correct mistakes however she was apparently frustrated  Bed Mobility  Overal bed mobility Modified Independent  Transfers  Overall transfer level Needs assistance  Equipment used None  Transfers Sit to/from Stand  Sit to Stand Min guard  General transfer comment assist for lines and to steady as up on her feet the first time  Balance  Overall balance assessment Needs assistance  Sitting balance-Leahy Scale Good  Standing balance-Leahy Scale Good  Standing balance comment washing hands at sink unsupported, HHA for ambulation for balance  General Comments  General comments (skin integrity, edema, etc.) Reviewed BeFast. Did not recall PT had reviewed this with her.  OT - End of Session  Activity Tolerance Patient tolerated treatment well  Patient left in chair;with call bell/phone within reach;with chair alarm set;with family/visitor present  Nurse Communication Other (comment) (DC needs)  OT Assessment  OT Recommendation/Assessment Patient needs continued OT Services  OT Visit Diagnosis Other symptoms and signs involving cognitive function  OT Problem List Decreased activity tolerance;Decreased safety awareness;Decreased knowledge of use of DME or AE;Decreased cognition;Impaired vision/perception  OT Plan  OT Frequency (ACUTE ONLY) Min 2X/week  OT Treatment/Interventions (ACUTE ONLY) Self-care/ADL training;DME and/or AE instruction;Therapeutic activities;Cognitive remediation/compensation;Visual/perceptual remediation/compensation;Patient/family education;Balance  training  AM-PAC OT "6 Clicks" Daily Activity Outcome Measure (Version 2)  Help from another person eating meals? 4  Help from another person taking care of personal grooming? 4  Help from another person toileting, which includes using toliet, bedpan, or urinal? 3  Help from another person bathing (including washing, rinsing, drying)? 3  Help from another person to put on and taking off regular upper body clothing? 3  Help from another person to put on and taking off regular lower body clothing? 3  6 Click Score 20  Progressive Mobility  What is the highest level of mobility based on the progressive mobility assessment? Level 5 (Walks with assist in room/hall) - Balance while stepping forward/back and can walk in room with assist - Complete  Mobility Out of bed for toileting;Out of bed to chair with meals  OT Recommendation  Follow Up Recommendations Outpatient OT (neuro outpt; refrain from driving)  OT Equipment None recommended by OT  Individuals Consulted  Consulted and Agree with Results and Recommendations Patient;Family member/caregiver  Family Member Consulted husband/dtr  Acute Rehab OT Goals  Patient Stated Goal to return to indepenent  OT Goal Formulation With patient/family  Time For Goal Achievement 09/26/21  Potential to Achieve Goals Good  OT Time Calculation  OT Start Time (ACUTE ONLY) 1300  OT Stop Time (ACUTE ONLY) 1330  OT Time Calculation (min) 30 min  OT General Charges  $OT Visit 1 Visit  OT Evaluation  $OT Eval Moderate Complexity 1 Mod  OT Treatments  $Self Care/Home Management  8-22 mins  Written Expression  Dominant Hand Right  Maurie Boettcher, OT/L   Acute OT Clinical Specialist [place] Pager [phone] Office [phone]

## 2021-09-12 NOTE — Care Management (Signed)
Notified by occupational therapist that patient will require outpatient OT at discharge.  Referral placed to [place] for follow-up.  Reinaldo Raddle, RN, BSN  Trauma/Neuro ICU Case Manager [phone]

## 2021-09-12 NOTE — TOC CAGE-AID Note (Signed)
Transition of Care [facility]) - CAGE-AID Screening   Patient Details  Name: Jordan Lane MRN: [id number] Date of Birth: Oct 22, 1946  [facility] CM/SW Contact:    Liliya Fullenwider C Tarpley-Carter, [place] Phone Number: 09/12/2021, 11:39 AM   Clinical Narrative: Pt is unable to participate in Cage Aid.   Deklen Popelka Tarpley-Carter, MSW, LCSW-A Pronouns:  She/Her/Hers [facility]itions [facility] Clinical Social Worker Direct Number:  [phone] [email or web address]  CAGE-AID Screening: Substance Abuse Screening unable to be completed due to: : Patient unable to participate             Substance Abuse Education Offered: No

## 2021-09-13 DIAGNOSIS — Z8673 Personal history of transient ischemic attack (TIA), and cerebral infarction without residual deficits: Secondary | ICD-10-CM | POA: Diagnosis not present

## 2021-09-13 DIAGNOSIS — G459 Transient cerebral ischemic attack, unspecified: Secondary | ICD-10-CM | POA: Diagnosis not present

## 2021-09-13 MED ORDER — PANTOPRAZOLE SODIUM 40 MG PO TBEC: 40.0000 mg | DELAYED_RELEASE_TABLET | Freq: Every day | ORAL | Status: DC

## 2021-09-13 MED ORDER — ASPIRIN EC 81 MG PO TBEC: 81.0000 mg | DELAYED_RELEASE_TABLET | Freq: Every day | ORAL | 0 refills | Status: AC

## 2021-09-13 MED ORDER — MENTHOL 3 MG MT LOZG
1.0000 | LOZENGE | OROMUCOSAL | Status: DC | PRN
  Administered 2021-09-13: 3 mg via ORAL
  Filled 2021-09-13: qty 9

## 2021-09-13 NOTE — Discharge Instructions (Signed)
Continue plavix daily at home. Will add aspirin 81 daily for 3 weeks only Continue lipitor  Continue other home medications Will arrange for heart monitoring with heart doctor Will see you in neurology clinic in 4 weeks or so.  If any stroke like symptoms again, please call 911 or go to the nearest ED ASAP.

## 2021-09-13 NOTE — Progress Notes (Signed)
Physical Therapy Treatment Patient Details Name: Jordan Lane MRN: [id number] DOB: 1945/12/27 Today's Date: 09/13/2021   History of Present Illness Jordan Lane is a 75 y.o. 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 MRI negative for acute stroke. EEG negative. PMH significant for hypertension, GERD, hyperlipidemia, breast CA, bil TKR    PT Comments    Pt is progressing well with mobility, getting closer to her baseline. Pt was able to ambulate without UE support, changing head positions, without LOB this date. Also, she was able to negotiate a flight of stairs using 1 handrail with min guard assist and no LOB today. She continues to ambulate at a very slow pace though. Will continue to follow acutely. Current recommendations remain appropriate.    Recommendations for follow up therapy are one component of a multi-disciplinary discharge planning process, led by the attending physician.  Recommendations may be updated based on patient status, additional functional criteria and insurance authorization.  Follow Up Recommendations  No PT follow up     Equipment Recommendations  None recommended by PT    Recommendations for Other Services       Precautions / Restrictions Precautions Precautions: Fall Restrictions Weight Bearing Restrictions: No     Mobility  Bed Mobility Overal bed mobility: Modified Independent             General bed mobility comments: Pt able to perform all bed mobility aspects without assistance, HOB elevated.    Transfers Overall transfer level: Needs assistance Equipment used: None Transfers: Sit to/from Stand Sit to Stand: Supervision         General transfer comment: assist for lines, no LOB, supervision for safety  Ambulation/Gait Ambulation/Gait assistance: Min guard Gait Distance (Feet): 130 Feet Assistive device: None Gait  Pattern/deviations: Step-through pattern;Decreased stride length;Decreased stance time - left;Decreased step length - right;Antalgic Gait velocity: reduced Gait velocity interpretation: <1.31 ft/sec, indicative of household ambulator General Gait Details: Pt with mildly antalgic gait pattern due to L hip pain, with pt intermittent trying to reach for wall for support. However, able to maintain balance even when changing head positions without LOB, min guard assist for safety   Stairs Stairs: Yes Stairs assistance: Min guard Stair Management: One rail Right;One rail Left;Alternating pattern;Step to pattern;Forwards Number of Stairs: 12 General stair comments: Ascends with R rail and descends with L rail to simulate home set-up. Ascends with reciprocal pattern and descends with step-to pattern. No LOB, min guard for safety.   Wheelchair Mobility    Modified Rankin (Stroke Patients Only) Modified Rankin (Stroke Patients Only) Pre-Morbid Rankin Score: No significant disability Modified Rankin: Slight disability     Balance Overall balance assessment: Needs assistance Sitting-balance support: No upper extremity supported;Feet supported Sitting balance-Leahy Scale: Good     Standing balance support: No upper extremity supported;During functional activity Standing balance-Leahy Scale: Good Standing balance comment: Able to ambulate without UE support and no LOB                            Cognition Arousal/Alertness: Awake/alert Behavior During Therapy: WFL for tasks assessed/performed Overall Cognitive Status: Impaired/Different from baseline Area of Impairment: Memory;Awareness;Problem solving                     Memory: Decreased short-term memory     Awareness: Anticipatory Problem Solving: Slow processing General Comments:  Pt easily distracted by lines, needing cues to pause to get untangled intermittently as she would begin to mess with it and get  tangled again. Improved awareness into her deficits and safety and responding/alternating her mobility to maintain her safety.      Exercises      General Comments General comments (skin integrity, edema, etc.): Educated pt and family to use RW if hip pain worsens and affects stability. Educated them to provide supervision initially upon going home for safety      Pertinent Vitals/Pain Pain Assessment: Faces Faces Pain Scale: Hurts a little bit Pain Location: L hip Pain Descriptors / Indicators: Discomfort Pain Intervention(s): Limited activity within patient's tolerance;Monitored during session;Repositioned    Home Living                      Prior Function            PT Goals (current goals can now be found in the care plan section) Acute Rehab PT Goals Patient Stated Goal: to go home today PT Goal Formulation: With patient/family Time For Goal Achievement: 09/19/21 Potential to Achieve Goals: Good Progress towards PT goals: Progressing toward goals    Frequency    Min 4X/week      PT Plan Current plan remains appropriate    Co-evaluation              AM-PAC PT "6 Clicks" Mobility   Outcome Measure  Help needed turning from your back to your side while in a flat bed without using bedrails?: None Help needed moving from lying on your back to sitting on the side of a flat bed without using bedrails?: None Help needed moving to and from a bed to a chair (including a wheelchair)?: A Little Help needed standing up from a chair using your arms (e.g., wheelchair or bedside chair)?: A Little Help needed to walk in hospital room?: A Little Help needed climbing 3-5 steps with a railing? : A Little 6 Click Score: 20    End of Session Equipment Utilized During Treatment: Gait belt Activity Tolerance: Patient tolerated treatment well Patient left: in bed;with call bell/phone within reach;with bed alarm set;with family/visitor present   PT Visit  Diagnosis: Other abnormalities of gait and mobility (R26.89);Muscle weakness (generalized) (M62.81);Unsteadiness on feet (R26.81);Difficulty in walking, not elsewhere classified (R26.2)     Time: 2902-1115 PT Time Calculation (min) (ACUTE ONLY): 15 min  Charges:  $Gait Training: 8-22 mins                     Moishe Spice, PT, DPT Acute Rehabilitation Services  Pager: [phone] Office: [place] 09/13/2021, 3:44 PM

## 2021-09-13 NOTE — Progress Notes (Signed)
LTM maintance done. No skin breakdown noted. Results pending. Electrode Impedance = good.

## 2021-09-13 NOTE — Discharge Summary (Signed)
Stroke Discharge Summary  Patient ID: Jordan Lane   MRN: [id number]      DOB: [birth year]  Date of Admission: 09/11/2021 Date of Discharge: 09/13/2021  Attending Physician:  Stroke, Md, MD, Stroke MD Consultant(s):    none  Patient's PCP:  Lavone Orn, MD  DISCHARGE DIAGNOSIS:  Active Problems:   TIA   Stroke symptoms s/p TNK   History of stroke   HTN   HLD   Allergies as of 09/13/2021       Reactions   Codeine Nausea And Vomiting   Zofran [ondansetron]         Medication List     STOP taking these medications    amLODipine 5 MG tablet Commonly known as: NORVASC   lidocaine 5 % ointment Commonly known as: XYLOCAINE   omeprazole 40 MG capsule Commonly known as: PRILOSEC       TAKE these medications    ALPRAZolam 0.25 MG tablet Commonly known as: XANAX Take 0.25 mg by mouth at bedtime as needed for anxiety or sleep.   aspirin EC 81 MG tablet Take 1 tablet (81 mg total) by mouth daily for 21 days. Swallow whole.   atorvastatin 20 MG tablet Commonly known as: LIPITOR Take 1 tablet (20 mg total) by mouth daily at 6 PM. What changed: when to take this   cholecalciferol 1000 units tablet Commonly known as: VITAMIN D Take 1,000 Units by mouth every evening.   clopidogrel 75 MG tablet Commonly known as: PLAVIX Take 1 tablet (75 mg total) by mouth daily. What changed: when to take this   diphenhydrAMINE 25 mg capsule Commonly known as: BENADRYL Take 25 mg by mouth every 6 (six) hours as needed for allergies.   escitalopram 10 MG tablet Commonly known as: LEXAPRO Take 10 mg by mouth at bedtime.   famotidine 20 MG tablet Commonly known as: PEPCID Take 20 mg by mouth daily as needed for heartburn or indigestion.   fluticasone 50 MCG/ACT nasal spray Commonly known as: FLONASE Place 1 spray into both nostrils daily as needed for allergies or rhinitis.   Klor-Con M20 20 MEQ tablet Generic drug: potassium chloride SA Take 20 mEq by  mouth every evening.   loperamide 2 MG tablet Commonly known as: IMODIUM A-D Take 2 mg by mouth 4 (four) times daily as needed for diarrhea or loose stools.   multivitamin tablet Take 1 tablet by mouth every evening.   valsartan-hydrochlorothiazide 160-25 MG tablet Commonly known as: DIOVAN-HCT Take 1 tablet by mouth every evening. What changed: Another medication with the same name was removed. Continue taking this medication, and follow the directions you see here.   vitamin B-12 100 MCG tablet Commonly known as: CYANOCOBALAMIN Take 100 mcg by mouth every evening.        LABORATORY STUDIES CBC    Component Value Date/Time   WBC 10.3 09/11/2021 1852   RBC 4.74 09/11/2021 1852   HGB 15.0 09/11/2021 1901   HCT 44.0 09/11/2021 1901   PLT 286 09/11/2021 1852   MCV 93.2 09/11/2021 1852   MCH 31.9 09/11/2021 1852   MCHC 34.2 09/11/2021 1852   RDW 12.8 09/11/2021 1852   LYMPHSABS 3.8 09/11/2021 1852   MONOABS 0.9 09/11/2021 1852   EOSABS 0.1 09/11/2021 1852   BASOSABS 0.1 09/11/2021 1852   CMP    Component Value Date/Time   NA 141 09/11/2021 1901   K 3.3 (L) 09/11/2021 1901   CL 100 09/11/2021 1901  CO2 29 09/11/2021 1852   GLUCOSE 134 (H) 09/11/2021 1901   BUN 14 09/11/2021 1901   CREATININE 0.90 09/11/2021 1901   CALCIUM 9.9 09/11/2021 1852   PROT 7.3 09/11/2021 1852   ALBUMIN 4.2 09/11/2021 1852   AST 34 09/11/2021 1852   ALT 19 09/11/2021 1852   ALKPHOS 54 09/11/2021 1852   BILITOT 0.8 09/11/2021 1852   GFRNONAA >60 09/11/2021 1852   GFRAA >60 10/21/2015 2023   COAGS Lab Results  Component Value Date   INR 1.1 09/11/2021   INR 1.02 10/21/2015   INR 0.93 11/16/2013   Lipid Panel    Component Value Date/Time   CHOL 125 09/12/2021 0403   TRIG 160 (H) 09/12/2021 0403   HDL 48 09/12/2021 0403   CHOLHDL 2.6 09/12/2021 0403   VLDL 32 09/12/2021 0403   LDLCALC 45 09/12/2021 0403   HgbA1C  Lab Results  Component Value Date   HGBA1C 5.3 09/12/2021    Urinalysis    Component Value Date/Time   COLORURINE YELLOW 11/16/2013 1124   APPEARANCEUR CLEAR 11/16/2013 1124   LABSPEC 1.023 11/16/2013 1124   PHURINE 6.0 11/16/2013 1124   GLUCOSEU NEGATIVE 11/16/2013 1124   [place] 11/16/2013 1124   [place] 11/16/2013 1124   KETONESUR NEGATIVE 11/16/2013 1124   PROTEINUR NEGATIVE 11/16/2013 1124   UROBILINOGEN 0.2 11/16/2013 1124   NITRITE NEGATIVE 11/16/2013 1124   LEUKOCYTESUR MODERATE (A) 11/16/2013 1124   Urine Drug Screen No results found for: LABOPIA, COCAINSCRNUR, LABBENZ, AMPHETMU, THCU, LABBARB  Alcohol Level    Component Value Date/Time   ETH <5 10/21/2015 2213     SIGNIFICANT DIAGNOSTIC STUDIES MR BRAIN WO CONTRAST  Result Date: 09/12/2021 CLINICAL DATA:  Neuro deficit, acute, stroke suspected EXAM: MRI HEAD WITHOUT CONTRAST TECHNIQUE: Multiplanar, multiecho pulse sequences of the brain and surrounding structures were obtained without intravenous contrast. COMPARISON:  2016 FINDINGS: Motion artifact is present. Brain: There is no acute infarction or intracranial hemorrhage. There is no intracranial mass, mass effect, or edema. There is no hydrocephalus or extra-axial fluid collection. Prominence of the ventricles and sulci reflects parenchymal volume loss. Patchy and confluent areas T2 hyperintensity in the supratentorial and pontine white matter are nonspecific but probably reflect similar moderate chronic microvascular ischemic changes. There are chronic small vessel infarcts of the central white matter bilaterally. Chronic small vessel infarcts and prominent perivascular spaces of the basal ganglia and thalami. Vascular: Major vessel flow voids at the skull base are preserved. Skull and upper cervical spine: Normal marrow signal is preserved. Sinuses/Orbits: Minor mucosal thickening. Bilateral lens replacements. Other: Sella is unremarkable.  Mastoid air cells are clear. IMPRESSION: No evidence of recent  infarction, hemorrhage, or mass. Moderate chronic microvascular ischemic changes and few chronic small vessel infarcts similar to prior study. Electronically Signed   By: Macy Mis M.D.   On: 09/12/2021 19:04   CT CEREBRAL PERFUSION W CONTRAST  Result Date: 09/11/2021 CLINICAL DATA:  Neuro deficit, acute, stroke suspected EXAM: CT PERFUSION BRAIN TECHNIQUE: Multiphase CT imaging of the brain was performed following IV bolus contrast injection. Subsequent parametric perfusion maps were calculated using RAPID software. CONTRAST:  73mL OMNIPAQUE IOHEXOL 350 MG/ML SOLN COMPARISON:  Same day CT exams. FINDINGS: CT Brain Perfusion Findings: CBF (<30%) Volume: 73mL Perfusion (Tmax>6.0s) volume: 56mL Mismatch Volume: 77mL, located in the anterior left frontal lobe and left parieto-occipital region. Mildly increased Tmax (>4 seconds) in the left cerebral hemisphere. ASPECTS on noncontrast CT Head: 10 earlier today. Infarct Core: 0 mL  Infarction Location:None IMPRESSION: RAPID detects mildly increased Tmax in the left cerebral hemisphere with small volume (7 mL) mismatch/penumbra in the anterior left frontal lobe and the left parieto-occipital region. No core infarct detected. Findings are indeterminate, but could potentially represent watershed type ischemia on the left. MRI could better evaluate for acute infarct if clinically indicated. Findings discussed with Dr. Milas Gain via telephone at 7:30 p.m. Electronically Signed   By: Margaretha Sheffield M.D.   On: 09/11/2021 19:38   EEG adult  Result Date: 09/12/2021 Lora Havens, MD     09/12/2021 10:00 AM Patient Name: Jordan Lane MRN: [id number] Epilepsy Attending: Lora Havens Referring Physician/Provider: Dr Donnetta Simpers Date: 09/11/2021 Duration: 23.59 mins Patient history: 75 year old female with sudden onset aphasia.  EEG to evaluate for seizures. Level of alertness: Awake AEDs during EEG study: None Technical aspects: This EEG study was done with  scalp electrodes positioned according to the 10-20 International system of electrode placement. Electrical activity was acquired at a sampling rate of 500Hz  and reviewed with a high frequency filter of 70Hz  and a low frequency filter of 1Hz . EEG data were recorded continuously and digitally stored. Description: The posterior dominant rhythm consists of 7.5 Hz activity of moderate voltage (25-35 uV) seen predominantly in posterior head regions, symmetric and reactive to eye opening and eye closing. EEG also showed intermittent 2 to 3 Hz delta slowing in left hemisphere, maximal left temporal region. Hyperventilation and photic stimulation were not performed.   ABNORMALITY -Intermittent slow, left hemisphere, maximal left temporal region IMPRESSION: This study is suggestive of cortical dysfunction in left hemisphere, maximal left temporal region nonspecific etiology but could be secondary to underlying structural abnormality, postictal state.  No seizures or epileptiform discharges were seen throughout the recording. Priyanka Barbra Sarks   Overnight EEG with video  Result Date: 09/13/2021 Lora Havens, MD     09/13/2021  7:35 AM Patient Name: Jordan Lane MRN: [id number] Epilepsy Attending: Lora Havens Referring Physician/Provider: Dr Donnetta Simpers Duration: 09/12/2021 2050 to 09/13/2021 0730  Patient history: 75 year old female with sudden onset aphasia.  EEG to evaluate for seizures.  Level of alertness: Awake, asleep  AEDs during EEG study: None  Technical aspects: This EEG study was done with scalp electrodes positioned according to the 10-20 International system of electrode placement. Electrical activity was acquired at a sampling rate of 500Hz  and reviewed with a high frequency filter of 70Hz  and a low frequency filter of 1Hz . EEG data were recorded continuously and digitally stored.  Description: The posterior dominant rhythm consists of 7.5 Hz activity of moderate voltage (25-35 uV) seen  predominantly in posterior head regions, symmetric and reactive to eye opening and eye closing. Sleep was characterized by vertex waves, sleep spindles (12-14Hz ), maximal frontocentral region. Hyperventilation and photic stimulation were not performed.     IMPRESSION: This study is within normal limits. No seizures or epileptiform discharges were seen throughout the recording.  Lora Havens   ECHOCARDIOGRAM COMPLETE  Result Date: 09/12/2021    ECHOCARDIOGRAM REPORT   Patient Name:   Jordan Lane Date of Exam: 09/12/2021 Medical Rec #:  [id number]         Height:       63.0 in Accession #:    [id number]        Weight:       160.9 lb Date of Birth:  1946/08/08         BSA:  1.763 m Patient Age:    35 years          BP:           173/75 mmHg Patient Gender: F                 HR:           75 bpm. Exam Location:  Inpatient Procedure: 2D Echo, Color Doppler and Cardiac Doppler Indications:    Stoke i63.9  History:        Patient has prior history of Echocardiogram examinations, most                 recent 10/22/2015. Risk Factors:Hypertension and Dyslipidemia.  Sonographer:    Raquel Sarna Senior RDCS Referring Phys: [id number] [facility]  Sonographer Comments: Technically difficult study due to very poor echo windows. IMPRESSIONS  1. Left ventricular ejection fraction, by estimation, is 60 to 65%. The left ventricle has normal function. Left ventricular endocardial border not optimally defined to evaluate regional wall motion. Left ventricular diastolic parameters are indeterminate.  2. Right ventricular systolic function is normal. The right ventricular size is normal. Tricuspid regurgitation signal is inadequate for assessing PA pressure.  3. The mitral valve is normal in structure. No evidence of mitral valve regurgitation. No evidence of mitral stenosis.  4. The aortic valve is grossly normal. Aortic valve regurgitation is not visualized. No aortic stenosis is present.  5. The inferior vena cava is  normal in size with greater than 50% respiratory variability, suggesting right atrial pressure of 3 mmHg. Conclusion(s)/Recommendation(s): No intracardiac source of embolism detected on this transthoracic study. A transesophageal echocardiogram is recommended to exclude cardiac source of embolism if clinically indicated. FINDINGS  Left Ventricle: Left ventricular ejection fraction, by estimation, is 60 to 65%. The left ventricle has normal function. Left ventricular endocardial border not optimally defined to evaluate regional wall motion. The left ventricular internal cavity size was normal in size. There is no left ventricular hypertrophy. Left ventricular diastolic parameters are indeterminate. Right Ventricle: The right ventricular size is normal. No increase in right ventricular wall thickness. Right ventricular systolic function is normal. Tricuspid regurgitation signal is inadequate for assessing PA pressure. Left Atrium: Left atrial size was normal in size. Right Atrium: Right atrial size was normal in size. Pericardium: There is no evidence of pericardial effusion. Presence of pericardial fat pad. Mitral Valve: The mitral valve is normal in structure. No evidence of mitral valve regurgitation. No evidence of mitral valve stenosis. Tricuspid Valve: The tricuspid valve is normal in structure. Tricuspid valve regurgitation is trivial. No evidence of tricuspid stenosis. Aortic Valve: The aortic valve is grossly normal. Aortic valve regurgitation is not visualized. No aortic stenosis is present. Pulmonic Valve: The pulmonic valve was not well visualized. Pulmonic valve regurgitation is not visualized. No evidence of pulmonic stenosis. Aorta: The aortic root is normal in size and structure. Venous: The inferior vena cava is normal in size with greater than 50% respiratory variability, suggesting right atrial pressure of 3 mmHg. IAS/Shunts: The interatrial septum was not well visualized.  LEFT VENTRICLE PLAX 2D  LVIDd:         3.50 cm   Diastology LVIDs:         2.30 cm   LV e' medial:    5.28 cm/s LV PW:         1.00 cm   LV E/e' medial:  10.9 LV IVS:        1.00 cm  LV e' lateral:   7.30 cm/s LVOT diam:     1.90 cm   LV E/e' lateral: 7.9 LV SV:         52 LV SV Index:   29 LVOT Area:     2.84 cm  RIGHT VENTRICLE RV S prime:     14.30 cm/s TAPSE (M-mode): 2.0 cm LEFT ATRIUM           Index        RIGHT ATRIUM           Index LA diam:      3.40 cm 1.93 cm/m   RA Area:     13.90 cm LA Vol (A4C): 43.4 ml 24.62 ml/m  RA Volume:   30.40 ml  17.24 ml/m  AORTIC VALVE LVOT Vmax:   96.60 cm/s LVOT Vmean:  73.300 cm/s LVOT VTI:    0.182 m  AORTA Ao Root diam: 3.00 cm MITRAL VALVE MV Area (PHT): 2.92 cm    SHUNTS MV Decel Time: 260 msec    Systemic VTI:  0.18 m MV E velocity: 57.60 cm/s  Systemic Diam: 1.90 cm MV A velocity: 93.30 cm/s MV E/A ratio:  0.62 Cherlynn Kaiser MD Electronically signed by Cherlynn Kaiser MD Signature Date/Time: 09/12/2021/6:00:48 PM    Final    CT HEAD CODE STROKE WO CONTRAST  Result Date: 09/11/2021 CLINICAL DATA:  Code stroke.  Difficulty speaking EXAM: CT HEAD WITHOUT CONTRAST TECHNIQUE: Contiguous axial images were obtained from the base of the skull through the vertex without intravenous contrast. COMPARISON:  None. FINDINGS: Brain: There is no mass, hemorrhage or extra-axial collection. The size and configuration of the ventricles and extra-axial CSF spaces are normal. There is hypoattenuation of the periventricular white matter, most commonly indicating chronic ischemic microangiopathy. Vascular: No abnormal hyperdensity of the major intracranial arteries or dural venous sinuses. No intracranial atherosclerosis. Skull: The visualized skull base, calvarium and extracranial soft tissues are normal. Sinuses/Orbits: No fluid levels or advanced mucosal thickening of the visualized paranasal sinuses. No mastoid or middle ear effusion. The orbits are normal. ASPECTS [facility] Stroke Program Early  CT Score) - Ganglionic level infarction (caudate, lentiform nuclei, internal capsule, insula, M1-M3 cortex): 7 - Supraganglionic infarction (M4-M6 cortex): 3 Total score (0-10 with 10 being normal): 10 IMPRESSION: 1. Chronic ischemic microangiopathy without acute intracranial abnormality. 2. ASPECTS is 10. These results were called by telephone at the time of interpretation on 09/11/2021 at 7:08 pm to provider [facility] , who verbally acknowledged these results. Electronically Signed   By: Ulyses Jarred M.D.   On: 09/11/2021 19:11   CT ANGIO HEAD CODE STROKE  Result Date: 09/11/2021 CLINICAL DATA:  Aphasia EXAM: CT ANGIOGRAPHY HEAD AND NECK TECHNIQUE: Multidetector CT imaging of the head and neck was performed using the standard protocol during bolus administration of intravenous contrast. Multiplanar CT image reconstructions and MIPs were obtained to evaluate the vascular anatomy. Carotid stenosis measurements (when applicable) are obtained utilizing NASCET criteria, using the distal internal carotid diameter as the denominator. CONTRAST:  20mL OMNIPAQUE IOHEXOL 350 MG/ML SOLN COMPARISON:  None. FINDINGS: CTA NECK FINDINGS SKELETON: There is no bony spinal canal stenosis. No lytic or blastic lesion. OTHER NECK: Normal pharynx, larynx and major salivary glands. No cervical lymphadenopathy. Unremarkable thyroid gland. UPPER CHEST: No pneumothorax or pleural effusion. No nodules or masses. AORTIC ARCH: There is calcific atherosclerosis of the aortic arch. There is no aneurysm, dissection or hemodynamically significant stenosis of the visualized portion of the aorta. Conventional  3 vessel aortic branching pattern. The visualized proximal subclavian arteries are widely patent. RIGHT CAROTID SYSTEM: Normal without aneurysm, dissection or stenosis. LEFT CAROTID SYSTEM: Normal without aneurysm, dissection or stenosis. VERTEBRAL ARTERIES: Left dominant configuration. Both origins are clearly patent. There is no  dissection, occlusion or flow-limiting stenosis to the skull base (V1-V3 segments). CTA HEAD FINDINGS POSTERIOR CIRCULATION: --Vertebral arteries: Normal V4 segments. --Inferior cerebellar arteries: Normal. --Basilar artery: Normal. --Superior cerebellar arteries: Normal. --Posterior cerebral arteries (PCA): Normal. ANTERIOR CIRCULATION: --Intracranial internal carotid arteries: Normal. --Anterior cerebral arteries (ACA): Normal. Both A1 segments are present. Patent anterior communicating artery (a-comm). --Middle cerebral arteries (MCA): Normal. VENOUS SINUSES: As permitted by contrast timing, patent. ANATOMIC VARIANTS: None Review of the MIP images confirms the above findings. IMPRESSION: Normal CTA of the head and neck. Aortic atherosclerosis (ICD10-I70.0). Electronically Signed   By: Ulyses Jarred M.D.   On: 09/11/2021 19:23   CT ANGIO NECK CODE STROKE  Result Date: 09/11/2021 CLINICAL DATA:  Aphasia EXAM: CT ANGIOGRAPHY HEAD AND NECK TECHNIQUE: Multidetector CT imaging of the head and neck was performed using the standard protocol during bolus administration of intravenous contrast. Multiplanar CT image reconstructions and MIPs were obtained to evaluate the vascular anatomy. Carotid stenosis measurements (when applicable) are obtained utilizing NASCET criteria, using the distal internal carotid diameter as the denominator. CONTRAST:  73mL OMNIPAQUE IOHEXOL 350 MG/ML SOLN COMPARISON:  None. FINDINGS: CTA NECK FINDINGS SKELETON: There is no bony spinal canal stenosis. No lytic or blastic lesion. OTHER NECK: Normal pharynx, larynx and major salivary glands. No cervical lymphadenopathy. Unremarkable thyroid gland. UPPER CHEST: No pneumothorax or pleural effusion. No nodules or masses. AORTIC ARCH: There is calcific atherosclerosis of the aortic arch. There is no aneurysm, dissection or hemodynamically significant stenosis of the visualized portion of the aorta. Conventional 3 vessel aortic branching pattern.  The visualized proximal subclavian arteries are widely patent. RIGHT CAROTID SYSTEM: Normal without aneurysm, dissection or stenosis. LEFT CAROTID SYSTEM: Normal without aneurysm, dissection or stenosis. VERTEBRAL ARTERIES: Left dominant configuration. Both origins are clearly patent. There is no dissection, occlusion or flow-limiting stenosis to the skull base (V1-V3 segments). CTA HEAD FINDINGS POSTERIOR CIRCULATION: --Vertebral arteries: Normal V4 segments. --Inferior cerebellar arteries: Normal. --Basilar artery: Normal. --Superior cerebellar arteries: Normal. --Posterior cerebral arteries (PCA): Normal. ANTERIOR CIRCULATION: --Intracranial internal carotid arteries: Normal. --Anterior cerebral arteries (ACA): Normal. Both A1 segments are present. Patent anterior communicating artery (a-comm). --Middle cerebral arteries (MCA): Normal. VENOUS SINUSES: As permitted by contrast timing, patent. ANATOMIC VARIANTS: None Review of the MIP images confirms the above findings. IMPRESSION: Normal CTA of the head and neck. Aortic atherosclerosis (ICD10-I70.0). Electronically Signed   By: Ulyses Jarred M.D.   On: 09/11/2021 19:23      HISTORY OF PRESENT ILLNESS Jordan Lane is a 75 y.o. female with PMH significant for hypertension, GERD, hyperlipidemia who presents with sudden onset aphasia with a last known well of 1800.  Patient was sitting in a couch watching TV when her speech suddenly would not make sense and she had difficulty comprehending.  Family called EMS and enroute, patient reported to EMS that she takes Eliquis and plavix.    On my evaluation, she has significant aphasia and unable to comprehend. I do not think she can accurately answer the quesiton regarding eliquis intake.    Made best attempt and with the help of our pharmacists reached out to patient's outpatient pharmacy, reviewed insurance records and we spoke to her daughter and went through her med list and  she does not use Eliquis.  Daugher denies that patient uses any discount cards to get eliquis directly from the manufacturer.   No hx of malignancy, no recent trauma, no CNS surgery in the last couple of months, no MI, no hx of aortic dissection, no hx of ICH, no hx of significant bleeding.   LKW: 1800 mRS: 0 tNK/thrombectomy: daughter Ms. Barbie Haggis consented to tNK administration. Patient's husband has mild dementia per daughter. No LVO, so not a candidate for thrombectomy.   HOSPITAL COURSE Jordan Lane is a 75 y.o. female with PMH significant for hypertension, GERD, hyperlipidemia who presents with sudden onset aphasia and right visual field visual disturbance with a last known well of 1800.   Embolic TIA - symptoms resolved s/p tNK administration,   CT no acute finding CT head and neck no LVO CTP 7 cc penumbra in the anterior left frontal lobe and left parietal occipital region MRI no acute infarct LTM EEG no seizure Recommend 30-day Cardiac event monitoring as outpatient to rule out A. fib.  If that was negative, consider loop recorder placement. 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 CTA head and neck showed mild left VA atherosclerosis. TTE and A1C unremarkable and LDL 114. Her ASA was switched to plavix and added lipitor.    Hypertension Home meds:  Diovan HCT 160-25mg , amlodipine 5mg   Stable Long-term BP goal normotensive   Hyperlipidemia Home meds:  lipitor 20  LDL 45, at goal < 70 Continue Lipitor 20 Continue statin at discharge   Other Stroke Risk Factors Advanced Age >/= 25  Overweight, Body mass index is 28.51 kg/m., BMI >/= 30 associated with increased stroke risk, recommend weight loss, diet and exercise as appropriate  Hx stroke/TIA   Other Active  Problems    RN Pressure Injury Documentation:     DISCHARGE EXAM Blood pressure 133/78, pulse 71, temperature 98.3 F (36.8 C), temperature source Axillary, resp. rate 18, weight 73 kg, SpO2 97 %.   Temp:  [97.6 F (36.4 C)-98.3 F (36.8 C)] 98.3 F (36.8 C) (10/15 1200) Pulse Rate:  [59-79] 75 (10/15 1400) Resp:  [11-22] 16 (10/15 1400) BP: (108-145)/(51-115) 130/51 (10/15 1400) SpO2:  [88 %-98 %] 91 % (10/15 1400)  General - Well nourished, well developed, in no apparent distress.  Ophthalmologic - fundi not visualized due to noncooperation.  Cardiovascular - Regular rhythm and rate.  Mental Status -  Level of arousal and orientation to time, place, and person were intact. Language including expression, naming, repetition, comprehension was assessed and found intact. Fund of Knowledge was assessed and was intact.  Cranial Nerves II - XII - II - Visual field intact OU. III, IV, VI - Extraocular movements intact. V - Facial sensation intact bilaterally. VII - Facial movement intact bilaterally. VIII - Hearing & vestibular intact bilaterally. X - Palate elevates symmetrically. XI - Chin turning & shoulder shrug intact bilaterally. XII - Tongue protrusion intact.  Motor Strength - The patient's strength was normal in all extremities and pronator drift was absent.  Bulk was normal and fasciculations were absent.   Motor Tone - Muscle tone was assessed at the neck and appendages and was normal.  Reflexes - The patient's reflexes were symmetrical in all extremities and she had no  pathological reflexes.  Sensory - Light touch, temperature/pinprick were assessed and were symmetrical.    Coordination - The patient had normal movements in the hands and feet with no ataxia or dysmetria.  Tremor was absent.  Gait and Station - deferred.   Discharge Diet       Diet   Diet Heart Room service appropriate? Yes; Fluid consistency: Thin   liquids  DISCHARGE  PLAN Disposition: Home aspirin 81 mg daily and clopidogrel 75 mg daily for secondary stroke prevention for 3 weeks then Plavix alone. Ongoing stroke risk factor control by Primary Care Physician at time of discharge Follow-up PCP Lavone Orn, MD in 2 weeks. Follow-up in [facility] in 4 weeks, office to schedule an appointment.   45 minutes were spent preparing discharge.  Rosalin Hawking, MD PhD Stroke Neurology 09/14/2021 1:40 AM

## 2021-09-13 NOTE — Procedures (Addendum)
Patient Name: Jordan Lane  MRN: [id number]  Epilepsy Attending: Lora Havens  Referring Physician/Provider: Dr Donnetta Simpers Duration: 09/12/2021 2050 to 09/13/2021 1246   Patient history: 75 year old female with sudden onset aphasia.  EEG to evaluate for seizures.   Level of alertness: Awake, asleep   AEDs during EEG study: None   Technical aspects: This EEG study was done with scalp electrodes positioned according to the 10-20 International system of electrode placement. Electrical activity was acquired at a sampling rate of 500Hz  and reviewed with a high frequency filter of 70Hz  and a low frequency filter of 1Hz . EEG data were recorded continuously and digitally stored.    Description: The posterior dominant rhythm consists of 7.5 Hz activity of moderate voltage (25-35 uV) seen predominantly in posterior head regions, symmetric and reactive to eye opening and eye closing. Sleep was characterized by vertex waves, sleep spindles (12-14Hz ), maximal frontocentral region. Hyperventilation and photic stimulation were not performed.       IMPRESSION: This study is within normal limits. No seizures or epileptiform discharges were seen throughout the recording.   Tristen Luce Barbra Sarks

## 2021-09-13 NOTE — Progress Notes (Signed)
vLTM disconnect  [facility] notified.  No skin breakdown noted on entire scalp.

## 2021-09-15 ENCOUNTER — Other Ambulatory Visit: Payer: Self-pay | Admitting: Student

## 2021-09-15 ENCOUNTER — Encounter: Payer: Self-pay | Admitting: *Deleted

## 2021-09-15 DIAGNOSIS — I63012 Cerebral infarction due to thrombosis of left vertebral artery: Secondary | ICD-10-CM

## 2021-09-15 NOTE — Progress Notes (Signed)
Ordered outpatient 30 day event monitor for further evaluation of CVA at the request of Dr. Erlinda Hong. Patient has not been seen by our office since 2013 so will also arrange New Patient visit to discuss results with Dr. Johnsie Cancel (who is Doc of the Day today) in about 6-8 weeks.  Darreld Mclean, PA-C 09/15/2021 7:58 AM

## 2021-09-15 NOTE — Progress Notes (Signed)
Patient ID: Jordan Lane, female   DOB: 01/16/1946, 75 y.o.   MRN: [id number] Patient enrolled for [employer] to ship a 30 day cardiac event monitor to her home. Letter with instructions mailed to patient.

## 2021-09-16 ENCOUNTER — Other Ambulatory Visit: Payer: Self-pay

## 2021-09-16 NOTE — Patient Instructions (Addendum)
Goals Addressed               This Visit's Progress     Keep or Improve My Strength-Stroke (pt-stated)        Barriers: None Timeframe:  Short-Term Goal Priority:  High Start Date:    09/16/21                         Expected End Date:    10/29/21                   Follow Up Date 10/29/21   - eat healthy to increase strength - increase activity or exercise time a little every week - join an activity or exercise group in community    Why is this important?   Before the stroke you probably did not think much about being safe when you are up and about.  Now, it may be harder for you to get around.  It may also be easier for you to trip or fall.  It is common to have muscle weakness after a stroke. You may also feel like you cannot control an arm or leg.  It will be helpful to work with a physical therapist to get your strength and muscle control back.  It is good to stay as active as you can. Walking and stretching help you stay strong and flexible.  The physical therapist will develop an exercise program just for you.     Notes: 09/16/21 Patient wants to start back water aerobics class at the [facility].  Encouraged patient to wait until she gets the clear from her physicians.  She verbalized understanding.   Stroke Symptoms Sudden numbness or weakness in the face, arm, or leg, especially on one side of the body. Sudden confusion, trouble speaking, or difficulty understanding speech. Sudden trouble seeing in one or both eyes. Sudden trouble walking, dizziness, loss of balance, or lack of coordination. Sudden severe headache with no known cause. Call 9-1-1 right away if you or someone else has any of these symptoms. During a stroke, every minute counts! Fast treatment Fast treatment can lessen the brain damage that stroke can cause. By knowing the signs and symptoms of stroke, you can take quick action and perhaps save a life.      Track and Manage My Blood Pressure-Hypertension         Barriers: Health Behaviors Timeframe:  Long-Range Goal Priority:  High Start Date:    05/29/22                         Expected End Date:                       Follow Up Date 10/29/21   - check blood pressure weekly - write blood pressure results in a log or diary    Why is this important?   You won't feel high blood pressure, but it can still hurt your blood vessels.  High blood pressure can cause heart or kidney problems. It can also cause a stroke.  Making lifestyle changes like losing a little weight or eating less salt will help.  Checking your blood pressure at home and at different times of the day can help to control blood pressure.  If the doctor prescribes medicine remember to take it the way the doctor ordered.  Call the office if you cannot afford the medicine or  if there are questions about it.     Notes: 09/16/21 Patient monitors blood pressure at home. Encouraged at least weekly monitoring.  Last blood pressure noted was 130/51.   Hypertension Management Discussed Take medications as ordered Limit salt intake. Eat plenty of fruits and vegetables Remain as active as possible Take blood pressure at least weekly at home and record in a notebook to take to doctor's visits.

## 2021-09-16 NOTE — Patient Outreach (Signed)
[facility] [place] [facility]) Care Management  [place]  09/16/2021   Jordan Lane [birth year] [id number]  Subjective: Telephone call to patient for follow up EMMI Stroke. Patient reports she is doing well just tired from the hospital. She denies any deficits with stroke as it was caught early.  Discussed red alerts.  Patient states her daughter follows up with appointments right now and will check with her about her appointments.  Discussed importance of follow up  appointments.    Patient lives in the home with spouse and is independent with care. She reports she was still driving and going to the [facility] for exercise.  Patient with medical history of recent stroke 09/11/21, HTN and hyperlipidemia.  Patient checks pressures at home. Encouraged weekly checks.    Discussed [facility] services and support. Patient agreeable to for CM to call again for follow up calls.    Objective:   Encounter Medications:  Outpatient Encounter Medications as of 09/16/2021  Medication Sig   aspirin EC 81 MG tablet Take 1 tablet (81 mg total) by mouth daily for 21 days. Swallow whole.   atorvastatin (LIPITOR) 20 MG tablet Take 1 tablet (20 mg total) by mouth daily at 6 PM. (Patient taking differently: Take 20 mg by mouth every evening.)   cholecalciferol (VITAMIN D) 1000 UNITS tablet Take 1,000 Units by mouth every evening.   clopidogrel (PLAVIX) 75 MG tablet Take 1 tablet (75 mg total) by mouth daily. (Patient taking differently: Take 75 mg by mouth every evening.)   diphenhydrAMINE (BENADRYL) 25 mg capsule Take 25 mg by mouth every 6 (six) hours as needed for allergies.   escitalopram (LEXAPRO) 10 MG tablet Take 10 mg by mouth at bedtime.   famotidine (PEPCID) 20 MG tablet Take 20 mg by mouth daily as needed for heartburn or indigestion.   fluticasone (FLONASE) 50 MCG/ACT nasal spray Place 1 spray into both nostrils daily as needed for allergies or rhinitis.   KLOR-CON M20 20 MEQ tablet Take 20 mEq  by mouth every evening.   loperamide (IMODIUM A-D) 2 MG tablet Take 2 mg by mouth 4 (four) times daily as needed for diarrhea or loose stools.   Multiple Vitamin (MULTIVITAMIN) tablet Take 1 tablet by mouth every evening.   valsartan-hydrochlorothiazide (DIOVAN-HCT) 160-25 MG tablet Take 1 tablet by mouth every evening.   vitamin B-12 (CYANOCOBALAMIN) 100 MCG tablet Take 100 mcg by mouth every evening.   ALPRAZolam (XANAX) 0.25 MG tablet Take 0.25 mg by mouth at bedtime as needed for anxiety or sleep. (Patient not taking: Reported on 09/16/2021)   No facility-administered encounter medications on file as of 09/16/2021.    Functional Status:  In your present state of health, do you have any difficulty performing the following activities: 09/16/2021 12/17/2020  Hearing? N -  Vision? - -  Difficulty concentrating or making decisions? N -  Walking or climbing stairs? N -  Dressing or bathing? N -  Doing errands, shopping? N N  Preparing Food and eating ? N -  Using the Toilet? N -  In the past six months, have you accidently leaked urine? N -  Do you have problems with loss of bowel control? N -  Managing your Medications? N -  Managing your Finances? N -  Housekeeping or managing your Housekeeping? N -  Some recent data might be hidden    Fall/Depression Screening: Fall Risk  09/16/2021 04/01/2016 12/27/2015  Falls in the past year? 0 No No   PHQ  2/9 Scores 09/16/2021  PHQ - 2 Score 0    Assessment:   Care Plan Care Plan : Hypertension (Adult)  Updates made by Jon Billings, RN since 09/16/2021 12:00 AM     Problem: Hypertension (Hypertension)      Long-Range Goal: Hypertension Monitored as evidenced by patient reported blood pressure less than 140/80   Start Date: 09/16/2021  Expected End Date: 05/29/2022  Priority: High  Note:   Evidence-based guidance:  Promote initial use of ambulatory blood pressure measurements (for 3 days) to rule out "white-coat" effect; identify  masked hypertension and presence or absence of nocturnal "dipping" of blood pressure.   Encourage continued use of home blood pressure monitoring and recording in blood pressure log; include symptoms of hypotension or potential medication side effects in log.  Review blood pressure measurements taken inside and outside of the provider office; establish baseline and monitor trends; compare to target ranges or patient goal.  Share overall cardiovascular risk with patient; encourage changes to lifestyle risk factors, including alcohol consumption, smoking, inadequate exercise, poor dietary habits and stress.   Notes:     Task: Identify and Monitor Blood Pressure Elevation   Due Date: 05/29/2022  Priority: Routine  Responsible User: Jon Billings, RN  Note:   Care Management Activities:    - depression screen reviewed - home or ambulatory blood pressure monitoring encouraged    Notes: 09/16/21 Patient checks pressure at home. Encouraged at least weekly checks and record.   Hypertension Management Discussed Take medications as ordered Limit salt intake. Eat plenty of fruits and vegetables Remain as active as possible Take blood pressure at least weekly at home and record in a notebook to take to doctor's visits.     Care Plan : Stroke (Adult)  Updates made by Jon Billings, RN since 09/16/2021 12:00 AM     Problem: Recurrence (Stroke)      Goal: Stroke Recurrence Prevented or Minimized as evidenced by no rehospitalization related to stroke.   Start Date: 09/16/2021  Expected End Date: 10/29/2021  Priority: High  Note:   Evidence-based guidance:  Establish baseline by comparing pre and poststroke level of function using age-appropriate criteria; consider history of transient ischemia attack.  Assess for sudden or new changes in attention, vision changes, language pattern, motor control and mobility, as well as facial drooping, unilateral weakness, confusion and fatigue that may  indicate recurrence.  Promote long-term management of risk factors, such as tobacco use, sedentary lifestyle, obesity, sleep disordered breathing, unhealthy diet and high alcohol consumption.  Prepare patient for cardiac monitoring (invasive or noninvasive) when cause of stroke is unknown; consider ankle-brachial index measurement as a means of predicting recurrence risk.  Monitor and manage comorbidity, such as atrial fibrillation, hypertension, carotid artery disease, cardiomyopathy, heart valve disease, sickle cell disease, coronary artery disease, sleep disordered breathing or depression.  Provide anticipatory guidance to women of childbearing age regarding potential discontinuation of oral contraceptives and alternative family planning method.   Assess and address adherence to pharmacologic therapy that may include antihypertensive, antiplatelet, anticoagulant, statin, fibrate and vitamin supplement; monitor and manage side effects.  Consider providing or referring to group-based support and/or education program; assess for cognitive impairment that may impact ability to follow therapeutic regimen.  Prepare patient for periodic checking of anticoagulant or antiplatelet therapy effectiveness.   Notes:     Task: Optimize Stroke Recovery   Due Date: 10/29/2021  Priority: Routine  Responsible User: Jon Billings, RN  Note:   Care Management Activities:    -  depression screen reviewed - healthy lifestyle promoted - self or caregiver awareness of signs/symptoms of repeat stroke promoted    Notes: 09/16/21 Patient with recent stroke. Patient reports being active prior to stroke. No deficits from stroke per patient.  Patient wanting to resume exercise but cautioned to wait for clearance from physician.   Stroke Symptoms Sudden numbness or weakness in the face, arm, or leg, especially on one side of the body. Sudden confusion, trouble speaking, or difficulty understanding speech. Sudden trouble  seeing in one or both eyes. Sudden trouble walking, dizziness, loss of balance, or lack of coordination. Sudden severe headache with no known cause. Call 9-1-1 right away if you or someone else has any of these symptoms. During a stroke, every minute counts! Fast treatment Fast treatment can lessen the brain damage that stroke can cause. By knowing the signs and symptoms of stroke, you can take quick action and perhaps save a life.      Goals Addressed               This Visit's Progress     Keep or Improve My Strength-Stroke (pt-stated)        Barriers: None Timeframe:  Short-Term Goal Priority:  High Start Date:    09/16/21                         Expected End Date:    10/29/21                   Follow Up Date 10/29/21   - eat healthy to increase strength - increase activity or exercise time a little every week - join an activity or exercise group in community    Why is this important?   Before the stroke you probably did not think much about being safe when you are up and about.  Now, it may be harder for you to get around.  It may also be easier for you to trip or fall.  It is common to have muscle weakness after a stroke. You may also feel like you cannot control an arm or leg.  It will be helpful to work with a physical therapist to get your strength and muscle control back.  It is good to stay as active as you can. Walking and stretching help you stay strong and flexible.  The physical therapist will develop an exercise program just for you.     Notes: 09/16/21 Patient wants to start back water aerobics class at the [facility].  Encouraged patient to wait until she gets the clear from her physicians.  She verbalized understanding.   Stroke Symptoms Sudden numbness or weakness in the face, arm, or leg, especially on one side of the body. Sudden confusion, trouble speaking, or difficulty understanding speech. Sudden trouble seeing in one or both eyes. Sudden trouble  walking, dizziness, loss of balance, or lack of coordination. Sudden severe headache with no known cause. Call 9-1-1 right away if you or someone else has any of these symptoms. During a stroke, every minute counts! Fast treatment Fast treatment can lessen the brain damage that stroke can cause. By knowing the signs and symptoms of stroke, you can take quick action and perhaps save a life.      Track and Manage My Blood Pressure-Hypertension        Barriers: Health Behaviors Timeframe:  Long-Range Goal Priority:  High Start Date:    05/29/22  Expected End Date:                       Follow Up Date 10/29/21   - check blood pressure weekly - write blood pressure results in a log or diary    Why is this important?   You won't feel high blood pressure, but it can still hurt your blood vessels.  High blood pressure can cause heart or kidney problems. It can also cause a stroke.  Making lifestyle changes like losing a little weight or eating less salt will help.  Checking your blood pressure at home and at different times of the day can help to control blood pressure.  If the doctor prescribes medicine remember to take it the way the doctor ordered.  Call the office if you cannot afford the medicine or if there are questions about it.     Notes: 09/16/21 Patient monitors blood pressure at home. Encouraged at least weekly monitoring.  Last blood pressure noted was 130/51.   Hypertension Management Discussed Take medications as ordered Limit salt intake. Eat plenty of fruits and vegetables Remain as active as possible Take blood pressure at least weekly at home and record in a notebook to take to doctor's visits.         Plan: RN CM will provide ongoing education and support to patient through phone calls.   RN CM will send welcome packet with consent to patient.   RN CM will send initial barriers letter, assessment, and care plan to primary care physician.    Follow-up: Patient agrees to Care Plan and Follow-up. Follow-up in 2-3 week(s)  Jone Baseman, RN, MSN [place] [facility] Care Management Coordinator Direct Line [phone] Cell [phone] Toll Free: [phone]  Fax: [phone]

## 2021-09-17 DIAGNOSIS — M1712 Unilateral primary osteoarthritis, left knee: Secondary | ICD-10-CM | POA: Diagnosis not present

## 2021-09-17 DIAGNOSIS — M25561 Pain in right knee: Secondary | ICD-10-CM | POA: Diagnosis not present

## 2021-09-17 DIAGNOSIS — M25562 Pain in left knee: Secondary | ICD-10-CM | POA: Diagnosis not present

## 2021-09-17 DIAGNOSIS — M170 Bilateral primary osteoarthritis of knee: Secondary | ICD-10-CM | POA: Diagnosis not present

## 2021-09-17 DIAGNOSIS — M1711 Unilateral primary osteoarthritis, right knee: Secondary | ICD-10-CM | POA: Diagnosis not present

## 2021-09-17 DIAGNOSIS — M25551 Pain in right hip: Secondary | ICD-10-CM | POA: Diagnosis not present

## 2021-09-19 DIAGNOSIS — I10 Essential (primary) hypertension: Secondary | ICD-10-CM | POA: Diagnosis not present

## 2021-09-19 DIAGNOSIS — G459 Transient cerebral ischemic attack, unspecified: Secondary | ICD-10-CM | POA: Diagnosis not present

## 2021-09-19 DIAGNOSIS — F419 Anxiety disorder, unspecified: Secondary | ICD-10-CM | POA: Diagnosis not present

## 2021-09-22 ENCOUNTER — Other Ambulatory Visit: Payer: Self-pay

## 2021-09-22 ENCOUNTER — Telehealth: Payer: Self-pay | Admitting: Occupational Therapy

## 2021-09-22 ENCOUNTER — Other Ambulatory Visit: Payer: Self-pay | Admitting: Neurology

## 2021-09-22 ENCOUNTER — Encounter: Payer: Self-pay | Admitting: Occupational Therapy

## 2021-09-22 ENCOUNTER — Ambulatory Visit: Payer: Medicare HMO | Attending: Neurology | Admitting: Occupational Therapy

## 2021-09-22 DIAGNOSIS — R2681 Unsteadiness on feet: Secondary | ICD-10-CM | POA: Diagnosis not present

## 2021-09-22 DIAGNOSIS — R41840 Attention and concentration deficit: Secondary | ICD-10-CM | POA: Insufficient documentation

## 2021-09-22 DIAGNOSIS — R2689 Other abnormalities of gait and mobility: Secondary | ICD-10-CM | POA: Insufficient documentation

## 2021-09-22 DIAGNOSIS — M6281 Muscle weakness (generalized): Secondary | ICD-10-CM | POA: Insufficient documentation

## 2021-09-22 DIAGNOSIS — R269 Unspecified abnormalities of gait and mobility: Secondary | ICD-10-CM

## 2021-09-22 NOTE — Therapy (Signed)
[facility] [address] [place], [place], [address] Phone: [phone]   Fax:  [phone]  Occupational Therapy Evaluation & One Time Visit  Patient Details  Name: Jordan Lane MRN: [id number] Date of Birth: 03/18/1946 Referring Provider (OT): Caroline More, MD   Encounter Date: 09/22/2021   OT End of Session - 09/22/21 1110     Visit Number 1   one time visit - eval only   Authorization Type [employer]    Authorization Time Period $20 copay    OT Start Time 1106   arrival time   OT Stop Time 5625    OT Time Calculation (min) 39 min    Behavior During Therapy [facility] for tasks assessed/performed             Past Medical History:  Diagnosis Date   Arthritis    Fibroadenoma of breast    Right   GERD (gastroesophageal reflux disease)    History of blood transfusion 52 yrs ago   Hypertension    Reflux    Stroke ([place]) 10/2015    Past Surgical History:  Procedure Laterality Date   ABDOMINAL HYSTERECTOMY  1984   TAH, partial   CHOLECYSTECTOMY  2009   ESOPHAGOGASTRODUODENOSCOPY  11/14/2012   Procedure: ESOPHAGOGASTRODUODENOSCOPY (EGD);  Surgeon: Wonda Horner, MD;  Location: [facility] ENDOSCOPY;  Service: Endoscopy;  Laterality: N/A;   ESOPHAGOGASTRODUODENOSCOPY (EGD) WITH PROPOFOL N/A 09/21/2016   Procedure: ESOPHAGOGASTRODUODENOSCOPY (EGD) WITH PROPOFOL;  Surgeon: Garlan Fair, MD;  Location: [facility] ENDOSCOPY;  Service: Endoscopy;  Laterality: N/A;   ESOPHAGOGASTRODUODENOSCOPY (EGD) WITH PROPOFOL N/A 12/17/2020   Procedure: ESOPHAGOGASTRODUODENOSCOPY (EGD) WITH PROPOFOL;  Surgeon: Ronald Lobo, MD;  Location: [facility] ENDOSCOPY;  Service: Endoscopy;  Laterality: N/A;   Herniated disc  1987   lower back    KNEE SURGERY  left 2010, right 2013   Arthroscopic   PARTIAL KNEE ARTHROPLASTY  06/14/2012   Procedure: UNICOMPARTMENTAL KNEE;  Surgeon: Mauri Pole, MD;  Location: [facility] ORS;  Service: Orthopedics;   Laterality: Left;  Left Medial Unicompartmental Knee   PARTIAL KNEE ARTHROPLASTY Right 11/27/2013   Procedure: UNICOMPARTMENTAL RIGHT KNEE, Steroid injection in right great toe;  Surgeon: Mauri Pole, MD;  Location: [facility] ORS;  Service: Orthopedics;  Laterality: Right;    There were no vitals filed for this visit.   Subjective Assessment - 09/22/21 1613     Subjective  Pt is  75 year old female that presents to Neuro OPOT s/p acute ischemic stroke on 09/11/21. Pt was independent prior to admission and living with spouse in 2 story house. Pt's bedroom and bathroom are on the 2nd level. Pt reports now being back to baseline with no residual effects of stroke except for some trouble with balance and fatigue. Pt denies any vision changes although reports the first sign of her stroke was having "glassy" right sided vision deficits on her tv screen. Pt denies any pain.    Patient is accompanied by: --   spouse in lobby   Pertinent History PMH: HTN, HLD, GERD    Patient Stated Goals "they told me to come"    Currently in Pain? No/denies    Pain Score 0-No pain               [facility] OT Assessment - 09/22/21 1113       Assessment   Medical Diagnosis Acute Ischemic Stroke    Referring Provider (OT) Caroline More, MD    Onset Date/Surgical  Date 09/11/21    Hand Dominance Right    Prior Therapy Acute      Precautions   Precautions Fall      Balance Screen   Has the patient fallen in the past 6 months No   reports difficulty with balance - will request PT referral     Home  Environment   Family/patient expects to be discharged to: Private residence    Living Arrangements Spouse/significant other   + dog   Available Help at Discharge Family    Type of [place] Bed/Bath upstairs    Bathroom Shower/Tub Tub/Shower unit    Bathroom Accessibility Yes      Prior Function   Level of [place] Retired    Leisure grandchildren, movies, water  aerobics, walking      ADL   ADL comments Pt reports indepenent with all ADLs      IADL   Shopping Needs to be accompanied on any shopping trip    [place] alone or with occasional assistance    Meal Prep Able to complete simple cold meal and snack prep;Able to complete simple warm meal prep    Prior Level of Function Community Mobility driving prior    [employer] Relies on family or friends for transportation      Mobility   Mobility Status Comments pt reports balance as a concern. requested PT referral to be placed.      Written Expression   Dominant Hand Right      Vision - History   Baseline Vision Wears glasses all the time    Additional Comments pt denies any changes with vision      Vision Assessment   Visual Fields No apparent deficits   pt reports first sign she saw was missing right side of her tv screen but has since resolved. may benefit from seeing her eye doctor for formal visual field testing.   Diplopia Assessment Other (comment)   denies any double vision     Activity Tolerance   Activity Tolerance --   reports increase fatigue with activity     Cognition   Overall Cognitive Status No family/caregiver present to determine baseline cognitive functioning    Cognition Comments difficulty with word finding (dog breed, etc)      Observation/Other Assessments   Focus on Therapeutic Outcomes (FOTO)  98% at eval   104 predicted at discharge     Sensation   Light Touch Appears Intact    Hot/Cold Appears Intact    Additional Comments denies any deficits      Coordination   9 Hole Peg Test Right;Left    Right 9 Hole Peg Test 26.66    Left 9 Hole Peg Test 26.72      ROM / Strength   AROM / PROM / Strength AROM;Strength      AROM   Overall AROM  Within functional limits for tasks performed      Strength   Overall Strength Within functional limits for tasks performed      Hand Function   Right Hand Gross Grasp Functional     Right Hand Grip (lbs) 46.9    Left Hand Gross Grasp Functional    Left Hand Grip (lbs) 37.2  Plan - 09/22/21 1617     Clinical Impression Statement Pt is a 76 year old female that presents to Neuro OPOT s/p acute ischemic stroke on 09/11/21. Pt with PMH significant for HTN, HLD and GERD. Pt presents with some cognitive deficits with alternating attention and word finding but reports those are not difference since before stroke. Skilled occupational therapy is not recommended at this time.    OT Occupational Profile and History Problem Focused Assessment - Including review of records relating to presenting problem    Body Structure / Function / Physical Skills Mobility;Vision    Financial risk analyst;Attention    Clinical Decision Making Limited treatment options, no task modification necessary    Comorbidities Affecting Occupational Performance: None    Modification or Assistance to Complete Evaluation  No modification of tasks or assist necessary to complete eval    OT Frequency One time visit   eval only   OT Treatment/Interventions Patient/family education    Recommended Other Services sent telephone encounter to Dr. Leonie Man for PT referral.    Consulted and Agree with Plan of Care Patient             Patient will benefit from skilled therapeutic intervention in order to improve the following deficits and impairments:   Body Structure / Function / Physical Skills: Mobility, Vision Cognitive Skills: Safety Awareness, Problem Solve, Attention     Visit Diagnosis: Attention and concentration deficit    Problem List Patient Active Problem List   Diagnosis Date Noted   Acute ischemic left MCA stroke ([place]) 09/11/2021   Palpitations 06/29/2016   Burning pain 01/07/2016   Acute ischemic stroke ([place]) 10/22/2015   HLD (hyperlipidemia)    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 [place], [place], [address] Phone: [phone]   Fax:  [phone]  Name: Jordan Lane MRN: [id number] Date of Birth: 06-25-1946

## 2021-09-22 NOTE — Telephone Encounter (Signed)
Dr. Leonie Man,  I saw Jordan Lane for an OT evaluation today. From an occupational therapy perspective, she is doing pretty well and we only did an evaluation today. She reported some balance challenges and demonstrated higher level cognitive deficits but reports that the cognition was not from the stroke. Due to the balance difficulty, I would recommend a PT referral and evaluation to assess her balance needs. If you are in agreement, will you put a referral into Epic for our clinic, [facility]?  Thank you for all you do, Waldo Laine, MOT, OTR/L

## 2021-09-25 ENCOUNTER — Ambulatory Visit (INDEPENDENT_AMBULATORY_CARE_PROVIDER_SITE_OTHER): Payer: Medicare HMO

## 2021-09-25 DIAGNOSIS — I639 Cerebral infarction, unspecified: Secondary | ICD-10-CM

## 2021-09-25 DIAGNOSIS — I493 Ventricular premature depolarization: Secondary | ICD-10-CM

## 2021-09-25 DIAGNOSIS — I4891 Unspecified atrial fibrillation: Secondary | ICD-10-CM | POA: Diagnosis not present

## 2021-09-25 DIAGNOSIS — I63012 Cerebral infarction due to thrombosis of left vertebral artery: Secondary | ICD-10-CM

## 2021-09-29 ENCOUNTER — Encounter: Payer: Self-pay | Admitting: Physical Therapy

## 2021-09-29 ENCOUNTER — Ambulatory Visit: Payer: Medicare HMO | Admitting: Physical Therapy

## 2021-09-29 ENCOUNTER — Other Ambulatory Visit: Payer: Self-pay

## 2021-09-29 DIAGNOSIS — R2681 Unsteadiness on feet: Secondary | ICD-10-CM | POA: Diagnosis not present

## 2021-09-29 DIAGNOSIS — M6281 Muscle weakness (generalized): Secondary | ICD-10-CM | POA: Diagnosis not present

## 2021-09-29 DIAGNOSIS — R2689 Other abnormalities of gait and mobility: Secondary | ICD-10-CM

## 2021-09-29 DIAGNOSIS — R41840 Attention and concentration deficit: Secondary | ICD-10-CM | POA: Diagnosis not present

## 2021-09-29 NOTE — Addendum Note (Signed)
Addended by: Arliss Journey on: 09/29/2021 04:49 PM   Modules accepted: Orders

## 2021-09-29 NOTE — Therapy (Addendum)
[facility] [address] [place], [place], [address] Phone: [phone]   Fax:  [phone]  Physical Therapy Evaluation  Patient Details  Name: Jordan Lane MRN: [id number] Date of Birth: 1946-05-11 Referring Provider (PT): Garvin Fila, MD   Encounter Date: 09/29/2021   PT End of Session - 09/29/21 1410     Visit Number 1    Number of Visits 9    Date for PT Re-Evaluation 11/28/21    PT Start Time 8127   pt late to eval   PT Stop Time 5170    PT Time Calculation (min) 33 min    Equipment Utilized During Treatment Gait belt    Activity Tolerance Patient tolerated treatment well    Behavior During Therapy [facility] for tasks assessed/performed             Past Medical History:  Diagnosis Date   Arthritis    Fibroadenoma of breast    Right   GERD (gastroesophageal reflux disease)    History of blood transfusion 52 yrs ago   Hypertension    Reflux    Stroke ([place]) 10/2015    Past Surgical History:  Procedure Laterality Date   ABDOMINAL HYSTERECTOMY  1984   TAH, partial   CHOLECYSTECTOMY  2009   ESOPHAGOGASTRODUODENOSCOPY  11/14/2012   Procedure: ESOPHAGOGASTRODUODENOSCOPY (EGD);  Surgeon: Wonda Horner, MD;  Location: [facility] ENDOSCOPY;  Service: Endoscopy;  Laterality: N/A;   ESOPHAGOGASTRODUODENOSCOPY (EGD) WITH PROPOFOL N/A 09/21/2016   Procedure: ESOPHAGOGASTRODUODENOSCOPY (EGD) WITH PROPOFOL;  Surgeon: Garlan Fair, MD;  [facility] ENDOSCOPY;  Service: Endoscopy;  Laterality: N/A;   ESOPHAGOGASTRODUODENOSCOPY (EGD) WITH PROPOFOL N/A 12/17/2020   Procedure: ESOPHAGOGASTRODUODENOSCOPY (EGD) WITH PROPOFOL;  Surgeon: Ronald Lobo, MD;  Location: [facility] ENDOSCOPY;  Service: Endoscopy;  Laterality: N/A;   Herniated disc  1987   lower back    KNEE SURGERY  left 2010, right 2013   Arthroscopic   PARTIAL KNEE ARTHROPLASTY  06/14/2012   Procedure: UNICOMPARTMENTAL KNEE;  Surgeon: Mauri Pole, MD;   Location: [facility] ORS;  Service: Orthopedics;  Laterality: Left;  Left Medial Unicompartmental Knee   PARTIAL KNEE ARTHROPLASTY Right 11/27/2013   Procedure: UNICOMPARTMENTAL RIGHT KNEE, Steroid injection in right great toe;  Surgeon: Mauri Pole, MD;  Location: [facility] ORS;  Service: Orthopedics;  Laterality: Right;    There were no vitals filed for this visit.    Subjective Assessment - 09/29/21 1411     Subjective Present on 09/11/21 with sudden onset aphasia thought to have L MCA distribution watershed CVA. MRI negative for acute stroke. EEG negative. Currently wearing a 30 day heart monitor. Has some balance difficulties since after the CVA. Reports feeling wobbly. Picking up objects from the floor is difficult. No falls. Also feeling more fatigued after the stroke. Has been doing water aerobics for the past 11 years.    Patient is accompained by: Family member   pt's spouse Shanon Brow   Pertinent History --    Limitations Walking    Patient Stated Goals improve balance    Currently in Pain? No/denies                [facility] PT Assessment - 09/29/21 1414       Assessment   Medical Diagnosis Acute Ischemic Stroke    Referring Provider (PT) Garvin Fila, MD    Onset Date/Surgical Date 09/22/21    Hand Dominance Right    Prior Therapy Acute  Precautions   Precautions Fall      Balance Screen   Has the patient fallen in the past 6 months No    Has the patient had a decrease in activity level because of a fear of falling?  No    Is the patient reluctant to leave their home because of a fear of falling?  No      Home Environment   Living Environment Private residence    Living Arrangements Spouse/significant other    Type of [place] to enter    Entrance Stairs-Number of Steps 3    Entrance Stairs-Rails Can reach both    Home Layout Two level    Alternate Level Stairs-Number of Steps 12    Alternate Level Stairs-Rails Right    Home Equipment None       Prior Function   Level of [place] Retired    Leisure [employer], playing with grandchildren      Sensation   Light Touch Appears Intact      Coordination   Gross Motor Movements are Fluid and Coordinated Yes      ROM / Strength   AROM / PROM / Strength Strength      Strength   Strength Assessment Site Hip;Knee;Ankle    Right/Left Hip Right;Left    Right Hip Flexion 4+/5    Left Hip Flexion 4+/5    Right/Left Knee Left;Right    Right Knee Flexion 5/5    Right Knee Extension 5/5    Left Knee Flexion 5/5    Left Knee Extension 5/5    Right/Left Ankle Right;Left    Right Ankle Dorsiflexion 5/5    Right Ankle Plantar Flexion 3+/5    Left Ankle Dorsiflexion 5/5    Left Ankle Plantar Flexion 3+/5      Transfers   Transfers Sit to Stand;Stand to Sit    Sit to Stand 6: Modified independent (Device/Increase time);Without upper extremity assist    Five time sit to stand comments  11.35 seconds    Stand to Sit 6: Modified independent (Device/Increase time);Without upper extremity assist      Ambulation/Gait   Ambulation/Gait Yes    Ambulation/Gait Assistance 5: Supervision    Assistive device None    Gait Pattern Step-through pattern;Right foot flat;Left foot flat;Decreased dorsiflexion - right;Decreased dorsiflexion - left    Ambulation Surface Level;Indoor    Gait velocity 13.56 seconds = 2.42 ft/sec      High Level Balance   High Level Balance Comments mCTSIB: conditions 1-4 = 30 seconds, mild postural sway with conditions 2 and 4, SLS: RLE <1 second, LLE 1-2 seconds      Functional Gait  Assessment   Gait assessed  Yes    Gait Level Surface Walks 20 ft, slow speed, abnormal gait pattern, evidence for imbalance or deviates 10-15 in outside of the 12 in walkway width. Requires more than 7 sec to ambulate 20 ft.   8.72   Change in Gait Speed Able to change speed, demonstrates mild gait deviations, deviates 6-10 in outside of the 12 in walkway  width, or no gait deviations, unable to achieve a major change in velocity, or uses a change in velocity, or uses an assistive device.    Gait with Horizontal Head Turns Performs head turns smoothly with no change in gait. Deviates no more than 6 in outside 12 in walkway width    Gait with Vertical Head  Turns Performs head turns with no change in gait. Deviates no more than 6 in outside 12 in walkway width.    Gait and Pivot Turn Pivot turns safely within 3 sec and stops quickly with no loss of balance.    Step Over Obstacle Is able to step over 2 stacked shoe boxes taped together (9 in total height) without changing gait speed. No evidence of imbalance.    Gait with Narrow Base of Support Ambulates 4-7 steps.    Gait with Eyes Closed Walks 20 ft, slow speed, abnormal gait pattern, evidence for imbalance, deviates 10-15 in outside 12 in walkway width. Requires more than 9 sec to ambulate 20 ft.   9.87   Ambulating Backwards Walks 20 ft, slow speed, abnormal gait pattern, evidence for imbalance, deviates 10-15 in outside 12 in walkway width.   24.47   Steps Alternating feet, must use rail.    Total Score 20    FGA comment: medium fall risk                        Objective measurements completed on examination: See above findings.                PT Education - 09/29/21 1444     Education Details Clinical findings, POC    Person(s) Educated Patient;Spouse    Methods Explanation    Comprehension Verbalized understanding              PT Short Term Goals - 09/29/21 1445       PT SHORT TERM GOAL #1   Title ALL STGS =LTGS               PT Long Term Goals - 09/29/21 1445       PT LONG TERM GOAL #1   Title Pt will be independent with final HEP in order to build upon functional gains made in therapy. ALL LTGS DUE 10/27/21    Time 4    Period Weeks    Status New    Target Date 10/27/21      PT LONG TERM GOAL #2   Title Pt will improve FGA score to  at least a 23/30 in order to demo decr fall risk.    Baseline 20/30    Time 4    Period Weeks    Status New      PT LONG TERM GOAL #3   Title Pt will improve gait speed to at least 2.8 ft/sec in order to demo improved community mobility.    Baseline 2.42 ft/sec    Time 4    Period Weeks    Status New      PT LONG TERM GOAL #4   Title Pt will improve SLS time to at least 5 seconds on both legs in order to demo improved SLS time for IADLs.    Baseline RLE <1 second, LLE 1-2 seconds    Time 4    Period Weeks    Status New                    Plan - 09/29/21 1447     Clinical Impression Statement Patient is a 75 year old female referred to Neuro OPPT for imbalance. Pt recently hospitalized for emoblic TIA. Pt's PMH is significant for: hypertension, GERD, hyperlipidemia, breast CA, bil TKR, arthritis, hx of CVA (10/2015)The following deficits were present during the exam: decr strength, impaired balance, gait abnormalities,  decr endurance, and pt reporting arthritic discomfort that limits mobility. Based on FGA, pt is a medium risk for falls. Pt's gait speed indicates pt is a limited community ambulator.  Pt would benefit from skilled PT to address these impairments and functional limitations to maximize functional mobility independence and decr fall risk.    Personal Factors and Comorbidities Comorbidity 3+;Past/Current Experience;Time since onset of injury/illness/exacerbation    Comorbidities hypertension, GERD, hyperlipidemia, breast CA, bil TKR, arthritis, hx of CVA (10/2015)    Examination-Activity Limitations Stairs;Squat;Locomotion Level    Examination-Participation Restrictions Community Activity    Stability/Clinical Decision Making Stable/Uncomplicated    Clinical Decision Making Low    Rehab Potential Good    PT Frequency 2x / week    PT Duration 8 weeks    PT Treatment/Interventions ADLs/Self Care Home Management;Gait training;Stair training;Therapeutic  activities;Functional mobility training;Therapeutic exercise;Balance training;Neuromuscular re-education;Patient/family education;Passive range of motion;Vestibular    PT Next Visit Plan initial HEP - PF strengthening, SLS, balance with eyes closed, unlevel surfaces, tandem, retro gait. Continue with high level balance.    Consulted and Agree with Plan of Care Patient             Patient will benefit from skilled therapeutic intervention in order to improve the following deficits and impairments:  Abnormal gait, Decreased balance, Decreased activity tolerance, Decreased endurance, Decreased strength, Difficulty walking  Visit Diagnosis: Unsteadiness on feet  Muscle weakness (generalized)  Other abnormalities of gait and mobility     Problem List Patient Active Problem List   Diagnosis Date Noted   Acute ischemic left MCA stroke ([place]) 09/11/2021   Palpitations 06/29/2016   Burning pain 01/07/2016   Acute ischemic stroke ([place]) 10/22/2015   HLD (hyperlipidemia)    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 [place], [place], [address] Phone: [phone]   Fax:  [phone]  Name: Jordan Lane MRN: [id number] Date of Birth: 1946-03-26

## 2021-10-03 ENCOUNTER — Other Ambulatory Visit: Payer: Self-pay

## 2021-10-03 NOTE — Patient Instructions (Signed)
Goals Addressed               This Visit's Progress     Keep or Improve My Strength-Stroke (pt-stated)   On track     Barriers: None Timeframe:  Short-Term Goal Priority:  High Start Date:    09/16/21                         Expected End Date:    11/29/21                   Follow Up Date 11/29/21   - eat healthy to increase strength - increase activity or exercise time a little every week - join an activity or exercise group in community    Why is this important?   Before the stroke you probably did not think much about being safe when you are up and about.  Now, it may be harder for you to get around.  It may also be easier for you to trip or fall.  It is common to have muscle weakness after a stroke. You may also feel like you cannot control an arm or leg.  It will be helpful to work with a physical therapist to get your strength and muscle control back.  It is good to stay as active as you can. Walking and stretching help you stay strong and flexible.  The physical therapist will develop an exercise program just for you.     Notes: 09/16/21 Patient wants to start back water aerobics class at [facility].  Encouraged patient to wait until she gets the clear from her physicians.  She verbalized understanding.   Stroke Symptoms Sudden numbness or weakness in the face, arm, or leg, especially on one side of the body. Sudden confusion, trouble speaking, or difficulty understanding speech. Sudden trouble seeing in one or both eyes. Sudden trouble walking, dizziness, loss of balance, or lack of coordination. Sudden severe headache with no known cause. Call 9-1-1 right away if you or someone else has any of these symptoms. During a stroke, every minute counts! Fast treatment Fast treatment can lessen the brain damage that stroke can cause. By knowing the signs and symptoms of stroke, you can take quick action and perhaps save a life. 10/03/21 Continues stroke recovery.  Patient still  having some balance issues.  Discussed pacing self with activities and to not get rushed.       Track and Manage My Blood Pressure-Hypertension   On track     Barriers: Health Behaviors Timeframe:  Long-Range Goal Priority:  High Start Date:    05/29/22                         Expected End Date:                       Follow Up Date 10/30/2211/31/22   - check blood pressure weekly - write blood pressure results in a log or diary    Why is this important?   You won't feel high blood pressure, but it can still hurt your blood vessels.  High blood pressure can cause heart or kidney problems. It can also cause a stroke.  Making lifestyle changes like losing a little weight or eating less salt will help.  Checking your blood pressure at home and at different times of the day can help to control blood pressure.  If the doctor prescribes medicine remember to take it the way the doctor ordered.  Call the office if you cannot afford the medicine or if there are questions about it.     Notes: 09/16/21 Patient monitors blood pressure at home. Encouraged at least weekly monitoring.  Last blood pressure noted was 130/51.   Hypertension Management Discussed Take medications as ordered Limit salt intake. Eat plenty of fruits and vegetables Remain as active as possible Take blood pressure at least weekly at home and record in a notebook to take to doctor's visits.  10/03/21 Patient checking blood pressure with last reading 127/77.  Continue with heart healthy diet.

## 2021-10-03 NOTE — Patient Outreach (Signed)
[facility]) Care Management  [place]  10/03/2021   Jordan Lane 03-30-1946 [id number]  Subjective: Telephone call to patient for follow up stroke. Patient doing well but has some problems with balance.  Patient has outpatient rehab.  Patient monitoring her blood pressure.  No concerns.    Objective:   Encounter Medications:  Outpatient Encounter Medications as of 10/03/2021  Medication Sig   ALPRAZolam (XANAX) 0.25 MG tablet Take 0.25 mg by mouth at bedtime as needed for anxiety or sleep. (Patient not taking: Reported on 09/16/2021)   aspirin EC 81 MG tablet Take 1 tablet (81 mg total) by mouth daily for 21 days. Swallow whole.   atorvastatin (LIPITOR) 20 MG tablet Take 1 tablet (20 mg total) by mouth daily at 6 PM. (Patient taking differently: Take 20 mg by mouth every evening.)   cholecalciferol (VITAMIN D) 1000 UNITS tablet Take 1,000 Units by mouth every evening.   clopidogrel (PLAVIX) 75 MG tablet Take 1 tablet (75 mg total) by mouth daily. (Patient taking differently: Take 75 mg by mouth every evening.)   diphenhydrAMINE (BENADRYL) 25 mg capsule Take 25 mg by mouth every 6 (six) hours as needed for allergies.   escitalopram (LEXAPRO) 10 MG tablet Take 10 mg by mouth at bedtime.   famotidine (PEPCID) 20 MG tablet Take 20 mg by mouth daily as needed for heartburn or indigestion.   fluticasone (FLONASE) 50 MCG/ACT nasal spray Place 1 spray into both nostrils daily as needed for allergies or rhinitis.   KLOR-CON M20 20 MEQ tablet Take 20 mEq by mouth every evening.   loperamide (IMODIUM A-D) 2 MG tablet Take 2 mg by mouth 4 (four) times daily as needed for diarrhea or loose stools.   Multiple Vitamin (MULTIVITAMIN) tablet Take 1 tablet by mouth every evening.   valsartan-hydrochlorothiazide (DIOVAN-HCT) 160-25 MG tablet Take 1 tablet by mouth every evening.   vitamin B-12 (CYANOCOBALAMIN) 100 MCG tablet Take 100 mcg by mouth every evening.   No  facility-administered encounter medications on file as of 10/03/2021.    Functional Status:  In your present state of health, do you have any difficulty performing the following activities: 09/16/2021 12/17/2020  Hearing? N -  Vision? N -  Difficulty concentrating or making decisions? N -  Walking or climbing stairs? N -  Dressing or bathing? N -  Doing errands, shopping? N N  Preparing Food and eating ? N -  Using the Toilet? N -  In the past six months, have you accidently leaked urine? N -  Do you have problems with loss of bowel control? N -  Managing your Medications? N -  Managing your Finances? N -  Housekeeping or managing your Housekeeping? N -  Some recent data might be hidden    Fall/Depression Screening: Fall Risk  09/16/2021 04/01/2016 12/27/2015  Falls in the past year? 0 No No   PHQ 2/9 Scores 09/16/2021  PHQ - 2 Score 0    Assessment:   Care Plan Care Plan : Stroke (Adult)  Updates made by Jon Billings, RN since 10/03/2021 12:00 AM     Problem: Recurrence (Stroke)      Goal: Stroke Recurrence Prevented or Minimized as evidenced by no rehospitalization related to stroke. Completed 10/03/2021  Start Date: 09/16/2021  Expected End Date: 10/29/2021  Priority: High  Note:   Evidence-based guidance:  Establish baseline by comparing pre and poststroke level of function using age-appropriate criteria; consider history of transient ischemia attack.  Assess for sudden or new changes in attention, vision changes, language pattern, motor control and mobility, as well as facial drooping, unilateral weakness, confusion and fatigue that may indicate recurrence.  Promote long-term management of risk factors, such as tobacco use, sedentary lifestyle, obesity, sleep disordered breathing, unhealthy diet and high alcohol consumption.  Prepare patient for cardiac monitoring (invasive or noninvasive) when cause of stroke is unknown; consider ankle-brachial index measurement as a  means of predicting recurrence risk.  Monitor and manage comorbidity, such as atrial fibrillation, hypertension, carotid artery disease, cardiomyopathy, heart valve disease, sickle cell disease, coronary artery disease, sleep disordered breathing or depression.  Provide anticipatory guidance to women of childbearing age regarding potential discontinuation of oral contraceptives and alternative family planning method.   Assess and address adherence to pharmacologic therapy that may include antihypertensive, antiplatelet, anticoagulant, statin, fibrate and vitamin supplement; monitor and manage side effects.  Consider providing or referring to group-based support and/or education program; assess for cognitive impairment that may impact ability to follow therapeutic regimen.  Prepare patient for periodic checking of anticoagulant or antiplatelet therapy effectiveness.   Notes  10/03/21 Patient managing well from stroke.       Task: Optimize Stroke Recovery Completed 10/03/2021  Due Date: 10/29/2021  Priority: Routine  Responsible User: Jon Billings, RN  Note:   Care Management Activities:    - depression screen reviewed - healthy lifestyle promoted - self or caregiver awareness of signs/symptoms of repeat stroke promoted    Notes: 09/16/21 Patient with recent stroke. Patient reports being active prior to stroke. No deficits from stroke per patient.  Patient wanting to resume exercise but cautioned to wait for clearance from physician.   Stroke Symptoms Sudden numbness or weakness in the face, arm, or leg, especially on one side of the body. Sudden confusion, trouble speaking, or difficulty understanding speech. Sudden trouble seeing in one or both eyes. Sudden trouble walking, dizziness, loss of balance, or lack of coordination. Sudden severe headache with no known cause. Call 9-1-1 right away if you or someone else has any of these symptoms. During a stroke, every minute counts! Fast  treatment Fast treatment can lessen the brain damage that stroke can cause. By knowing the signs and symptoms of stroke, you can take quick action and perhaps save a life.      Goals Addressed               This Visit's Progress     Keep or Improve My Strength-Stroke (pt-stated)   On track     Barriers: None Timeframe:  Short-Term Goal Priority:  High Start Date:    09/16/21                         Expected End Date:    11/29/21                   Follow Up Date 11/29/21   - eat healthy to increase strength - increase activity or exercise time a little every week - join an activity or exercise group in community    Why is this important?   Before the stroke you probably did not think much about being safe when you are up and about.  Now, it may be harder for you to get around.  It may also be easier for you to trip or fall.  It is common to have muscle weakness after a stroke. You may also feel like you cannot  control an arm or leg.  It will be helpful to work with a physical therapist to get your strength and muscle control back.  It is good to stay as active as you can. Walking and stretching help you stay strong and flexible.  The physical therapist will develop an exercise program just for you.     Notes: 09/16/21 Patient wants to start back water aerobics class at the [facility].  Encouraged patient to wait until she gets the clear from her physicians.  She verbalized understanding.   Stroke Symptoms Sudden numbness or weakness in the face, arm, or leg, especially on one side of the body. Sudden confusion, trouble speaking, or difficulty understanding speech. Sudden trouble seeing in one or both eyes. Sudden trouble walking, dizziness, loss of balance, or lack of coordination. Sudden severe headache with no known cause. Call 9-1-1 right away if you or someone else has any of these symptoms. During a stroke, every minute counts! Fast treatment Fast treatment can lessen the  brain damage that stroke can cause. By knowing the signs and symptoms of stroke, you can take quick action and perhaps save a life. 10/03/21 Continues stroke recovery.  Patient still having some balance issues.  Discussed pacing self with activities and to not get rushed.       Track and Manage My Blood Pressure-Hypertension   On track     Barriers: Health Behaviors Timeframe:  Long-Range Goal Priority:  High Start Date:    05/29/22                         Expected End Date:                       Follow Up Date 10/30/2211/31/22   - check blood pressure weekly - write blood pressure results in a log or diary    Why is this important?   You won't feel high blood pressure, but it can still hurt your blood vessels.  High blood pressure can cause heart or kidney problems. It can also cause a stroke.  Making lifestyle changes like losing a little weight or eating less salt will help.  Checking your blood pressure at home and at different times of the day can help to control blood pressure.  If the doctor prescribes medicine remember to take it the way the doctor ordered.  Call the office if you cannot afford the medicine or if there are questions about it.     Notes: 09/16/21 Patient monitors blood pressure at home. Encouraged at least weekly monitoring.  Last blood pressure noted was 130/51.   Hypertension Management Discussed Take medications as ordered Limit salt intake. Eat plenty of fruits and vegetables Remain as active as possible Take blood pressure at least weekly at home and record in a notebook to take to doctor's visits.  10/03/21 Patient checking blood pressure with last reading 127/77.  Continue with heart healthy diet.         Plan:  Follow-up: Patient agrees to Care Plan and Follow-up. Follow-up in 4-6 week(s)  Jone Baseman, RN, MSN [place] Management Care Management Coordinator Direct Line [phone] Cell [phone] Toll Free: [phone]  Fax:  [phone]

## 2021-10-08 ENCOUNTER — Ambulatory Visit: Payer: Medicare HMO | Attending: Neurology | Admitting: Physical Therapy

## 2021-10-08 ENCOUNTER — Other Ambulatory Visit: Payer: Self-pay

## 2021-10-08 DIAGNOSIS — R2681 Unsteadiness on feet: Secondary | ICD-10-CM | POA: Insufficient documentation

## 2021-10-08 DIAGNOSIS — M6281 Muscle weakness (generalized): Secondary | ICD-10-CM | POA: Insufficient documentation

## 2021-10-08 DIAGNOSIS — R2689 Other abnormalities of gait and mobility: Secondary | ICD-10-CM | POA: Insufficient documentation

## 2021-10-10 ENCOUNTER — Other Ambulatory Visit: Payer: Self-pay

## 2021-10-10 ENCOUNTER — Encounter: Payer: Self-pay | Admitting: Physical Therapy

## 2021-10-10 ENCOUNTER — Ambulatory Visit: Payer: Medicare HMO | Admitting: Physical Therapy

## 2021-10-10 DIAGNOSIS — M6281 Muscle weakness (generalized): Secondary | ICD-10-CM

## 2021-10-10 DIAGNOSIS — R2681 Unsteadiness on feet: Secondary | ICD-10-CM

## 2021-10-10 DIAGNOSIS — R2689 Other abnormalities of gait and mobility: Secondary | ICD-10-CM | POA: Diagnosis not present

## 2021-10-10 NOTE — Therapy (Signed)
[facility] [address] [place], [place], [address] Phone: [phone]   Fax:  [phone]  Physical Therapy Treatment  Patient Details  Name: TALESHA ELLITHORPE MRN: [id number] Date of Birth: 01-15-1946 Referring Provider (PT): Garvin Fila, MD   Encounter Date: 10/10/2021   PT End of Session - 10/10/21 1453     Visit Number 2    Number of Visits 9    Date for PT Re-Evaluation 11/28/21    PT Start Time 1450    PT Stop Time 1531   6 minutes nonbillable - pt in restoom   PT Time Calculation (min) 41 min    Equipment Utilized During Treatment Gait belt    Activity Tolerance Patient tolerated treatment well    Behavior During Therapy [facility] for tasks assessed/performed             Past Medical History:  Diagnosis Date   Arthritis    Fibroadenoma of breast    Right   GERD (gastroesophageal reflux disease)    History of blood transfusion 52 yrs ago   Hypertension    Reflux    Stroke ([place]) 10/2015    Past Surgical History:  Procedure Laterality Date   ABDOMINAL HYSTERECTOMY  1984   TAH, partial   CHOLECYSTECTOMY  2009   ESOPHAGOGASTRODUODENOSCOPY  11/14/2012   Procedure: ESOPHAGOGASTRODUODENOSCOPY (EGD);  Surgeon: Wonda Horner, MD;  [facility] ENDOSCOPY;  Service: Endoscopy;  Laterality: N/A;   ESOPHAGOGASTRODUODENOSCOPY (EGD) WITH PROPOFOL N/A 09/21/2016   Procedure: ESOPHAGOGASTRODUODENOSCOPY (EGD) WITH PROPOFOL;  Surgeon: Garlan Fair, MD;  Location: [facility] ENDOSCOPY;  Service: Endoscopy;  Laterality: N/A;   ESOPHAGOGASTRODUODENOSCOPY (EGD) WITH PROPOFOL N/A 12/17/2020   Procedure: ESOPHAGOGASTRODUODENOSCOPY (EGD) WITH PROPOFOL;  Surgeon: Ronald Lobo, MD;  Location: [facility] ENDOSCOPY;  Service: Endoscopy;  Laterality: N/A;   Herniated disc  1987   lower back    KNEE SURGERY  left 2010, right 2013   Arthroscopic   PARTIAL KNEE ARTHROPLASTY  06/14/2012   Procedure: UNICOMPARTMENTAL KNEE;  Surgeon:  Mauri Pole, MD;  Location: [facility] ORS;  Service: Orthopedics;  Laterality: Left;  Left Medial Unicompartmental Knee   PARTIAL KNEE ARTHROPLASTY Right 11/27/2013   Procedure: UNICOMPARTMENTAL RIGHT KNEE, Steroid injection in right great toe;  Surgeon: Mauri Pole, MD;  Location: [facility] ORS;  Service: Orthopedics;  Laterality: Right;    There were no vitals filed for this visit.   Subjective Assessment - 10/10/21 1452     Subjective Feels like her balance/strength is getting better. Not feeling as wobbly.    Patient is accompained by: Family member   pt's spouse Shanon Brow   Pertinent History PMH: hypertension, GERD, hyperlipidemia, breast CA, bil TKR, CVA 2016    Limitations Walking    Patient Stated Goals improve balance    Currently in Pain? No/denies                           Access Code: [email or web address] URL: [email or web address] Date: 10/10/2021 Prepared by: Janann August  Initiated HEP - see MedBridge for more details.   Exercises Heel Toe Raises with Counter Support - 1 x daily - 5 x weekly - 2 sets - 10 reps Standing Marching - 1-2 x daily - 5 x weekly - 3 sets - forwards and then backwards at countertop, cues for slowed and controlled  Tandem Walking with Counter Support - 1-2 x daily - 5 x weekly - 3 sets Romberg Stance  Eyes Closed on Foam Pad - 1-2 x daily - 5 x weekly - 3 sets - 30 hold  Wide Stance with Eyes Closed and Head Rotation on Foam Pad - 1-2 x daily - 5 x weekly - 2 sets - 10 reps, an additional 2 x 10 reps head nods          Balance Exercises - 10/10/21 1541       Balance Exercises: Standing   SLS with Vectors Solid surface;Limitations    SLS with Vectors Limitations Alternating cone taps x10 reps each leg and then forward/cross body cone tap x10 reps, incr difficulty performing on LLE    Rockerboard Anterior/posterior;Lateral;Limitations    Rockerboard Limitations A/P weight shifting x15 reps no UE support, x10 reps head turns,  x10 reps head nods - intermittent UE support for balance. In M/L direction: x10 reps slow weight shifting, then x5 reps reaching towards R/L to tap object outside of BOS                PT Education - 10/10/21 1539     Education Details Initial HEP    Person(s) Educated Patient;Spouse    Methods Explanation;Demonstration;Handout    Comprehension Returned demonstration;Verbalized understanding              PT Short Term Goals - 09/29/21 1445       PT SHORT TERM GOAL #1   Title ALL STGS =LTGS               PT Long Term Goals - 09/29/21 1445       PT LONG TERM GOAL #1   Title Pt will be independent with final HEP in order to build upon functional gains made in therapy. ALL LTGS DUE 10/27/21    Time 4    Period Weeks    Status New    Target Date 10/27/21      PT LONG TERM GOAL #2   Title Pt will improve FGA score to at least a 23/30 in order to demo decr fall risk.    Baseline 20/30    Time 4    Period Weeks    Status New      PT LONG TERM GOAL #3   Title Pt will improve gait speed to at least 2.8 ft/sec in order to demo improved community mobility.    Baseline 2.42 ft/sec    Time 4    Period Weeks    Status New      PT LONG TERM GOAL #4   Title Pt will improve SLS time to at least 5 seconds on both legs in order to demo improved SLS time for IADLs.    Baseline RLE <1 second, LLE 1-2 seconds    Time 4    Period Weeks    Status New                   Plan - 10/10/21 1539     Clinical Impression Statement Today's skilled session focused on initiating HEP for balance. Pt challenged by eyes closed on a compliant surface with head motions. Remainder of session focused on balance on compliant surfaces and SLS. Pt most challenged by LLE SLS. Will continue to progress towards LTGs.    Personal Factors and Comorbidities Comorbidity 3+;Past/Current Experience;Time since onset of injury/illness/exacerbation    Comorbidities hypertension, GERD,  hyperlipidemia, breast CA, bil TKR, arthritis, hx of CVA (10/2015)    Examination-Activity Limitations Stairs;Squat;Locomotion Level    Examination-Participation Restrictions  Community Activity    Stability/Clinical Decision Making Stable/Uncomplicated    Rehab Potential Good    PT Frequency 2x / week    PT Duration 8 weeks    PT Treatment/Interventions ADLs/Self Care Home Management;Gait training;Stair training;Therapeutic activities;Functional mobility training;Therapeutic exercise;Balance training;Neuromuscular re-education;Patient/family education;Passive range of motion;Vestibular    PT Next Visit Plan PF strengthening, SLS, balance with eyes closed, unlevel surfaces, tandem, retro gait. Continue with high level balance.    PT Home Exercise Plan [email or web address]    Consulted and Agree with Plan of Care Patient             Patient will benefit from skilled therapeutic intervention in order to improve the following deficits and impairments:  Abnormal gait, Decreased balance, Decreased activity tolerance, Decreased endurance, Decreased strength, Difficulty walking  Visit Diagnosis: Unsteadiness on feet  Muscle weakness (generalized)  Other abnormalities of gait and mobility     Problem List Patient Active Problem List   Diagnosis Date Noted   Acute ischemic left MCA stroke ([place]) 09/11/2021   Palpitations 06/29/2016   Burning pain 01/07/2016   Acute ischemic stroke ([place]) 10/22/2015   HLD (hyperlipidemia)    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 [place], [place], [address] Phone: [phone]   Fax:  [phone]  Name: KASSIA DEMARINIS MRN: [id number] Date of Birth: [birth year]

## 2021-10-10 NOTE — Patient Instructions (Signed)
Access Code: [email or web address] URL: [email or web address] Date: 10/10/2021 Prepared by: Janann August  Exercises Heel Toe Raises with Counter Support - 1 x daily - 5 x weekly - 2 sets - 10 reps Standing Marching - 1-2 x daily - 5 x weekly - 3 sets Tandem Walking with Counter Support - 1-2 x daily - 5 x weekly - 3 sets Romberg Stance Eyes Closed on Foam Pad - 1-2 x daily - 5 x weekly - 3 sets - 30 hold Wide Stance with Eyes Closed and Head Rotation on Foam Pad - 1-2 x daily - 5 x weekly - 2 sets - 10 reps

## 2021-10-14 ENCOUNTER — Ambulatory Visit: Payer: Medicare HMO | Admitting: Physical Therapy

## 2021-10-14 DIAGNOSIS — H9202 Otalgia, left ear: Secondary | ICD-10-CM | POA: Diagnosis not present

## 2021-10-17 ENCOUNTER — Ambulatory Visit: Payer: Medicare HMO | Admitting: Physical Therapy

## 2021-10-17 ENCOUNTER — Other Ambulatory Visit: Payer: Self-pay

## 2021-10-17 ENCOUNTER — Encounter: Payer: Self-pay | Admitting: Physical Therapy

## 2021-10-17 DIAGNOSIS — M6281 Muscle weakness (generalized): Secondary | ICD-10-CM

## 2021-10-17 DIAGNOSIS — R2689 Other abnormalities of gait and mobility: Secondary | ICD-10-CM

## 2021-10-17 DIAGNOSIS — R2681 Unsteadiness on feet: Secondary | ICD-10-CM

## 2021-10-17 NOTE — Therapy (Signed)
[facility] [address] [place], [place], [address] Phone: [phone]   Fax:  [phone]  Physical Therapy Treatment  Patient Details  Name: Jordan Lane MRN: [id number] Date of Birth: 1946/05/25 Referring Provider (PT): Garvin Fila, MD   Encounter Date: 10/17/2021   PT End of Session - 10/17/21 1536     Visit Number 3    Number of Visits 9    Date for PT Re-Evaluation 11/28/21    PT Start Time 1450    PT Stop Time 1530    PT Time Calculation (min) 40 min    Equipment Utilized During Treatment Gait belt    Activity Tolerance Patient tolerated treatment well    Behavior During Therapy [facility] for tasks assessed/performed             Past Medical History:  Diagnosis Date   Arthritis    Fibroadenoma of breast    Right   GERD (gastroesophageal reflux disease)    History of blood transfusion 52 yrs ago   Hypertension    Reflux    Stroke ([place]) 10/2015    Past Surgical History:  Procedure Laterality Date   ABDOMINAL HYSTERECTOMY  1984   TAH, partial   CHOLECYSTECTOMY  2009   ESOPHAGOGASTRODUODENOSCOPY  11/14/2012   Procedure: ESOPHAGOGASTRODUODENOSCOPY (EGD);  Surgeon: Wonda Horner, MD;  Location: [facility] ENDOSCOPY;  Service: Endoscopy;  Laterality: N/A;   ESOPHAGOGASTRODUODENOSCOPY (EGD) WITH PROPOFOL N/A 09/21/2016   Procedure: ESOPHAGOGASTRODUODENOSCOPY (EGD) WITH PROPOFOL;  Surgeon: Garlan Fair, MD;  Location: [facility] ENDOSCOPY;  Service: Endoscopy;  Laterality: N/A;   ESOPHAGOGASTRODUODENOSCOPY (EGD) WITH PROPOFOL N/A 12/17/2020   Procedure: ESOPHAGOGASTRODUODENOSCOPY (EGD) WITH PROPOFOL;  Surgeon: Ronald Lobo, MD;  Location: [facility] ENDOSCOPY;  Service: Endoscopy;  Laterality: N/A;   Herniated disc  1987   lower back    KNEE SURGERY  left 2010, right 2013   Arthroscopic   PARTIAL KNEE ARTHROPLASTY  06/14/2012   Procedure: UNICOMPARTMENTAL KNEE;  Surgeon: Mauri Pole, MD;  Location: [facility] ORS;   Service: Orthopedics;  Laterality: Left;  Left Medial Unicompartmental Knee   PARTIAL KNEE ARTHROPLASTY Right 11/27/2013   Procedure: UNICOMPARTMENTAL RIGHT KNEE, Steroid injection in right great toe;  Surgeon: Mauri Pole, MD;  Location: [facility] ORS;  Service: Orthopedics;  Laterality: Right;    There were no vitals filed for this visit.   Subjective Assessment - 10/17/21 1452     Subjective Reports feeling off balance when walking. Reports took meclizine in the past for dizziness. Saw her PCP recently to have her ears cleaned and reports that it was affecting her balance.    Patient is accompained by: Family member   pt's spouse Shanon Brow   Pertinent History PMH: hypertension, GERD, hyperlipidemia, breast CA, bil TKR, CVA 2016    Limitations Walking    Patient Stated Goals improve balance    Currently in Pain? No/denies                               [facility] Adult PT Treatment/Exercise - 10/17/21 0001       Therapeutic Activites    Therapeutic Activities Other Therapeutic Activities    Other Therapeutic Activities Due to pt reporting feeling unsteady during gait (pt initially describing it as dizziness), wanted to rule out BPPV. Performed bilat horizontal roll test and dix hallpike with both negative and no nystagmus/no dizziness reported. Discussed with pt that her sensation of "  dizziness" is probably more like due to a dysequilibrium                 Balance Exercises - 10/17/21 1517       Balance Exercises: Standing   Standing Eyes Opened Narrow base of support (BOS);Foam/compliant surface;Limitations    Standing Eyes Opened Limitations x10 reps head turns, x10 reps head nods    Standing Eyes Closed Foam/compliant surface;Narrow base of support (BOS);Limitations    Standing Eyes Closed Limitations On air ex; 3 x 30 seconds, with feet apart x10 reps head turns, x10 reps head nods    SLS with Vectors Foam/compliant surface    SLS with Vectors Limitations At  bottom of staircase on air ex, alternating toe taps to 6" step x10 reps then to 12" step x10 reps, needing intermittent UE support.    Tandem Gait Forward;4 reps;Limitations    Tandem Gait Limitations Down and back on blue mat x4 reps with cues for slowed and controlled, trying without UE support    Step Over Hurdles / Cones Stepping over 3 smaller orange hurdles and 2 larger orange hurdles, down and back x4 reps, without UE support.    Marching Foam/compliant surface;5 reps;Limitations    Marching Limitations Down and back on blue mat, slowed and controlled x4 reps    Other Standing Exercises On air ex: wide BOS weight shifting and then adding in reaching and looking at hands, x10 reps, needing intermittent min A for balance.                PT Education - 10/17/21 1536     Education Details Purpose of 3 balance systems, and purpose of exercises in therapy and how they relate to pt's impairments.    Person(s) Educated Patient    Methods Explanation    Comprehension Verbalized understanding              PT Short Term Goals - 09/29/21 1445       PT SHORT TERM GOAL #1   Title ALL STGS =LTGS               PT Long Term Goals - 09/29/21 1445       PT LONG TERM GOAL #1   Title Pt will be independent with final HEP in order to build upon functional gains made in therapy. ALL LTGS DUE 10/27/21    Time 4    Period Weeks    Status New    Target Date 10/27/21      PT LONG TERM GOAL #2   Title Pt will improve FGA score to at least a 23/30 in order to demo decr fall risk.    Baseline 20/30    Time 4    Period Weeks    Status New      PT LONG TERM GOAL #3   Title Pt will improve gait speed to at least 2.8 ft/sec in order to demo improved community mobility.    Baseline 2.42 ft/sec    Time 4    Period Weeks    Status New      PT LONG TERM GOAL #4   Title Pt will improve SLS time to at least 5 seconds on both legs in order to demo improved SLS time for IADLs.     Baseline RLE <1 second, LLE 1-2 seconds    Time 4    Period Weeks    Status New  Plan - 10/17/21 1537     Clinical Impression Statement Assessed for BPPV due to pt reporting feeling unsteady during gait and wanted to rule it out. BPPV was negative for roll test and Marye Round with pt reporting no symptoms. Remainder of session focused on incr vestibular input for balance, SLS tasks, and unlevel surfaces. Will continue to progress towards LTGs.    Personal Factors and Comorbidities Comorbidity 3+;Past/Current Experience;Time since onset of injury/illness/exacerbation    Comorbidities hypertension, GERD, hyperlipidemia, breast CA, bil TKR, arthritis, hx of CVA (10/2015)    Examination-Activity Limitations Stairs;Squat;Locomotion Level    Examination-Participation Restrictions Community Activity    Stability/Clinical Decision Making Stable/Uncomplicated    Rehab Potential Good    PT Frequency 2x / week    PT Duration 8 weeks    PT Treatment/Interventions ADLs/Self Care Home Management;Gait training;Stair training;Therapeutic activities;Functional mobility training;Therapeutic exercise;Balance training;Neuromuscular re-education;Patient/family education;Passive range of motion;Vestibular    PT Next Visit Plan PF strengthening, SLS, balance with eyes closed, unlevel surfaces, tandem, retro gait. Continue with high level balance.    PT Home Exercise Plan [id number]    Consulted and Agree with Plan of Care Patient             Patient will benefit from skilled therapeutic intervention in order to improve the following deficits and impairments:  Abnormal gait, Decreased balance, Decreased activity tolerance, Decreased endurance, Decreased strength, Difficulty walking  Visit Diagnosis: Unsteadiness on feet  Muscle weakness (generalized)  Other abnormalities of gait and mobility     Problem List Patient Active Problem List   Diagnosis Date Noted   Acute  ischemic left MCA stroke ([place]) 09/11/2021   Palpitations 06/29/2016   Burning pain 01/07/2016   Acute ischemic stroke ([place]) 10/22/2015   HLD (hyperlipidemia)    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 [place], [place], [address] Phone: [phone]   Fax:  [phone]  Name: DAISJA KESSINGER MRN: [id number] Date of Birth: 12-06-1945

## 2021-10-21 ENCOUNTER — Other Ambulatory Visit: Payer: Self-pay

## 2021-10-21 ENCOUNTER — Ambulatory Visit: Payer: Medicare HMO | Admitting: Physical Therapy

## 2021-10-21 DIAGNOSIS — R2681 Unsteadiness on feet: Secondary | ICD-10-CM

## 2021-10-21 DIAGNOSIS — R2689 Other abnormalities of gait and mobility: Secondary | ICD-10-CM | POA: Diagnosis not present

## 2021-10-21 DIAGNOSIS — M6281 Muscle weakness (generalized): Secondary | ICD-10-CM

## 2021-10-21 NOTE — Therapy (Addendum)
[facility] [address] [place], [place], [address] Phone: [phone]   Fax:  [phone]  Physical Therapy Treatment  Patient Details  Name: Jordan Lane MRN: [id number] Date of Birth: 1946-02-09 Referring Provider (PT): Garvin Fila, MD   Encounter Date: 10/21/2021   PT End of Session - 10/21/21 1536     Visit Number 4    Number of Visits 9    Date for PT Re-Evaluation 11/28/21    PT Start Time 1537   pt needing to use restroom at start of session   PT Stop Time 1615    PT Time Calculation (min) 38 min    Equipment Utilized During Treatment Gait belt    Activity Tolerance Patient tolerated treatment well    Behavior During Therapy [facility] for tasks assessed/performed             Past Medical History:  Diagnosis Date   Arthritis    Fibroadenoma of breast    Right   GERD (gastroesophageal reflux disease)    History of blood transfusion 52 yrs ago   Hypertension    Reflux    Stroke ([place]) 10/2015    Past Surgical History:  Procedure Laterality Date   ABDOMINAL HYSTERECTOMY  1984   TAH, partial   CHOLECYSTECTOMY  2009   ESOPHAGOGASTRODUODENOSCOPY  11/14/2012   Procedure: ESOPHAGOGASTRODUODENOSCOPY (EGD);  Surgeon: Wonda Horner, MD;  Location: [facility] ENDOSCOPY;  Service: Endoscopy;  Laterality: N/A;   ESOPHAGOGASTRODUODENOSCOPY (EGD) WITH PROPOFOL N/A 09/21/2016   Procedure: ESOPHAGOGASTRODUODENOSCOPY (EGD) WITH PROPOFOL;  Surgeon: Garlan Fair, MD;  Location: [facility] ENDOSCOPY;  Service: Endoscopy;  Laterality: N/A;   ESOPHAGOGASTRODUODENOSCOPY (EGD) WITH PROPOFOL N/A 12/17/2020   Procedure: ESOPHAGOGASTRODUODENOSCOPY (EGD) WITH PROPOFOL;  Surgeon: Ronald Lobo, MD;  Location: [facility] ENDOSCOPY;  Service: Endoscopy;  Laterality: N/A;   Herniated disc  1987   lower back    KNEE SURGERY  left 2010, right 2013   Arthroscopic   PARTIAL KNEE ARTHROPLASTY  06/14/2012   Procedure: UNICOMPARTMENTAL KNEE;   Surgeon: Mauri Pole, MD;  Location: [facility] ORS;  Service: Orthopedics;  Laterality: Left;  Left Medial Unicompartmental Knee   PARTIAL KNEE ARTHROPLASTY Right 11/27/2013   Procedure: UNICOMPARTMENTAL RIGHT KNEE, Steroid injection in right great toe;  Surgeon: Mauri Pole, MD;  Location: [facility] ORS;  Service: Orthopedics;  Laterality: Right;    There were no vitals filed for this visit.   Subjective Assessment - 10/21/21 1538     Subjective No changes since she was last here.    Patient is accompained by: Family member   pt's spouse Shanon Brow   Pertinent History PMH: hypertension, GERD, hyperlipidemia, breast CA, bil TKR, CVA 2016    Limitations Walking    Patient Stated Goals improve balance    Currently in Pain? No/denies                                    Balance Exercises - 10/21/21 1551       Balance Exercises: Standing   Tandem Stance Eyes open;Intermittent upper extremity support;3 reps;Upper extremity support 2;Foam/compliant surface;30 secs;Limitations    Tandem Stance Time performed x3 reps bilat in corner, intermittent UE support as needed for balance.    SLS with Vectors Foam/compliant surface    SLS with Vectors Limitations On air ex; alternating SLS taps to cones x10 reps with UE support > none, then x10  reps cross body cone taps with UE support for balance    Stepping Strategy Anterior;Posterior;Foam/compliant surface;Limitations;10 reps    Stepping Strategy Limitations On blue foam beam, x10 reps alternating each direction, incr difficulty going forwards, cues for foot clearance when stepping back on    Rockerboard Anterior/posterior;Limitations    Rockerboard Limitations A/P weight shifting x15 reps no UE support, x10 reps head turns, x10 reps head nods - intermittent UE support for balance. x10 reps mini squats with cues for proper technique.    Step Ups 6 inch;Limitations    Step Ups Limitations With air ex on floor, x10 reps each leg stepping up  onto 6" step, then x5 reps bilat alternating legs    Sidestepping 4 reps;Limitations    Sidestepping Limitations Down and back x4 reps on foam beam, cues for step length and foot clearance, intermittent UE support needed.    Other Standing Exercises Comments On air ex: x10 reps heel toe raises with UE support, x10 reps without                  PT Short Term Goals - 09/29/21 1445       PT SHORT TERM GOAL #1   Title ALL STGS =LTGS               PT Long Term Goals - 09/29/21 1445       PT LONG TERM GOAL #1   Title Pt will be independent with final HEP in order to build upon functional gains made in therapy. ALL LTGS DUE 10/27/21    Time 4    Period Weeks    Status New    Target Date 10/27/21      PT LONG TERM GOAL #2   Title Pt will improve FGA score to at least a 23/30 in order to demo decr fall risk.    Baseline 20/30    Time 4    Period Weeks    Status New      PT LONG TERM GOAL #3   Title Pt will improve gait speed to at least 2.8 ft/sec in order to demo improved community mobility.    Baseline 2.42 ft/sec    Time 4    Period Weeks    Status New      PT LONG TERM GOAL #4   Title Pt will improve SLS time to at least 5 seconds on both legs in order to demo improved SLS time for IADLs.    Baseline RLE <1 second, LLE 1-2 seconds    Time 4    Period Weeks    Status New                   Plan - 10/21/21 1716     Clinical Impression Statement Today's skilled session focused on BLE strength and balance on compliant surfaces. Pt tolerated session well, challenged by tandem stance and SLS tasks. Will continue to progress towards LTGs.    Personal Factors and Comorbidities Comorbidity 3+;Past/Current Experience;Time since onset of injury/illness/exacerbation    Comorbidities hypertension, GERD, hyperlipidemia, breast CA, bil TKR, arthritis, hx of CVA (10/2015)    Examination-Activity Limitations Stairs;Squat;Locomotion Level     Examination-Participation Restrictions Community Activity    Stability/Clinical Decision Making Stable/Uncomplicated    Rehab Potential Good    PT Frequency 2x / week    PT Duration 8 weeks    PT Treatment/Interventions ADLs/Self Care Home Management;Gait training;Stair training;Therapeutic activities;Functional mobility training;Therapeutic exercise;Balance training;Neuromuscular re-education;Patient/family education;Passive range  of motion;Vestibular    PT Next Visit Plan PF strengthening, SLS, balance with eyes closed, unlevel surfaces, tandem, retro gait. Continue with high level balance.    PT Home Exercise Plan [id number]    Consulted and Agree with Plan of Care Patient             Patient will benefit from skilled therapeutic intervention in order to improve the following deficits and impairments:  Abnormal gait, Decreased balance, Decreased activity tolerance, Decreased endurance, Decreased strength, Difficulty walking  Visit Diagnosis: Unsteadiness on feet  Muscle weakness (generalized)  Other abnormalities of gait and mobility     Problem List Patient Active Problem List   Diagnosis Date Noted   Acute ischemic left MCA stroke ([place]) 09/11/2021   Palpitations 06/29/2016   Burning pain 01/07/2016   Acute ischemic stroke ([place]) 10/22/2015   HLD (hyperlipidemia)    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 [place], [place], [address] Phone: [phone]   Fax:  [phone]  Name: JULIENE KIRSH MRN: [id number] Date of  Birth: Oct 22, 1946

## 2021-10-22 ENCOUNTER — Ambulatory Visit: Payer: Medicare HMO | Admitting: Adult Health

## 2021-10-22 ENCOUNTER — Encounter: Payer: Self-pay | Admitting: Adult Health

## 2021-10-22 VITALS — BP 134/82 | HR 70 | Ht 62.5 in | Wt 156.0 lb

## 2021-10-22 DIAGNOSIS — I69398 Other sequelae of cerebral infarction: Secondary | ICD-10-CM

## 2021-10-22 DIAGNOSIS — I639 Cerebral infarction, unspecified: Secondary | ICD-10-CM

## 2021-10-22 DIAGNOSIS — R269 Unspecified abnormalities of gait and mobility: Secondary | ICD-10-CM | POA: Diagnosis not present

## 2021-10-22 NOTE — Patient Instructions (Signed)
Continue working with therapies for hopeful ongoing improvement   Continue clopidogrel 75 mg daily  and atorvastatin 20mg  daily  for secondary stroke prevention  Continue to follow up with PCP regarding cholesterol and blood pressure management  Maintain strict control of hypertension with blood pressure goal below 130/90 and cholesterol with LDL cholesterol (bad cholesterol) goal below 70 mg/dL.   Complete cardiac monitor and if this does not show any atrial fibrillation, would recommend proceeding with loop recorder (this can be further discussed at follow up visit with cardiology)     Followup in the future with me in 6 months or call earlier if needed       Thank you for coming to see [place] at [facility]. I hope we have been able to provide you high quality care today.  You may receive a patient satisfaction survey over the next few weeks. We would appreciate your feedback and comments so that we may continue to improve ourselves and the health of our patients.

## 2021-10-22 NOTE — Progress Notes (Signed)
[facility] [address] [place] [address] [phone]       HOSPITAL FOLLOW UP NOTE  [name] Date of Birth:  [birth year] Medical Record Number:  [id number]   Reason for Referral:  hospital stroke follow up    SUBJECTIVE:   CHIEF COMPLAINT:  Chief Complaint  Patient presents with   Follow-up    Rm 2 alone- here for hsp f/u. Pt reports she has been doing ok, feels unsteady on her feet at times. Denies any falls at this time, working with rehab on this issue.     HPI:   Jordan Lane is a 75 y.o. female with PMH significant for hypertension, GERD, hyperlipidemia who presented on 09/11/2021 with sudden onset aphasia and right sided visual disturbance.  Personally reviewed hospitalization pertinent progress notes, lab work and imaging.  Evaluated by Dr. Leonie Man for likely embolic TIA with resolution of symptoms s/p tNK administration.  MRI no acute infarct.  LTM EEG no seizure.  Recommended 30-day cardiac event monitor to assess for A. fib and if negative to consider loop recorder placement.  LDL 45.  A1c 5.3.  Recommended DAPT for 3 weeks then Plavix alone and continuation of home dose atorvastatin.  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  PT/OT recommended OP OT  Today, 10/22/2021, patient being seen for initial hospital follow-up unaccompanied.   Reports continued imbalance - some prior imbalance since prior stroke but slightly worsened after recent event. Working with PT which has been helping but has only been there 3 times so far. Ambulates without assistive device.  Denies any recent falls.  Denies new stroke/TIA symptoms.  Completed 3 weeks DAPT - remains on plavix and atorvastatin without side effects  Blood pressure today 134/82. Occasionally monitor at home.  Currently working cardiac monitor - will be completed Friday and has f/u with cardiology to review 12/2  No further concerns at this  time    PERTINENT IMAGING/LABS  Per recent hospitalization CT no acute finding CT head and neck no LVO CTP 7 cc penumbra in the anterior left frontal lobe and left parietal occipital region MRI no acute infarct LTM EEG no seizure LDL 45 HgbA1c 5.3    ROS:   14 system review of systems performed and negative with exception of those listed in HPI  PMH:  Past Medical History:  Diagnosis Date   Arthritis    Fibroadenoma of breast    Right   GERD (gastroesophageal reflux disease)    History of blood transfusion 52 yrs ago   Hypertension    Reflux    Stroke ([place]) 10/2015    PSH:  Past Surgical History:  Procedure Laterality Date   ABDOMINAL HYSTERECTOMY  1984   TAH, partial   CHOLECYSTECTOMY  2009   ESOPHAGOGASTRODUODENOSCOPY  11/14/2012   Procedure: ESOPHAGOGASTRODUODENOSCOPY (EGD);  Surgeon: Wonda Horner, MD;  Location: [facility] ENDOSCOPY;  Service: Endoscopy;  Laterality: N/A;   ESOPHAGOGASTRODUODENOSCOPY (EGD) WITH PROPOFOL N/A 09/21/2016   Procedure: ESOPHAGOGASTRODUODENOSCOPY (EGD) WITH PROPOFOL;  Surgeon: Garlan Fair, MD;  Location: [facility] ENDOSCOPY;  Service: Endoscopy;  Laterality: N/A;   ESOPHAGOGASTRODUODENOSCOPY (EGD) WITH PROPOFOL N/A 12/17/2020   Procedure: ESOPHAGOGASTRODUODENOSCOPY (EGD) WITH PROPOFOL;  Surgeon: Ronald Lobo, MD;  Location: [facility] ENDOSCOPY;  Service: Endoscopy;  Laterality: N/A;   Herniated disc  1987   lower back    KNEE SURGERY  left 2010, right 2013   Arthroscopic   PARTIAL KNEE ARTHROPLASTY  06/14/2012   Procedure:  UNICOMPARTMENTAL KNEE;  Surgeon: Mauri Pole, MD;  Location: [facility] ORS;  Service: Orthopedics;  Laterality: Left;  Left Medial Unicompartmental Knee   PARTIAL KNEE ARTHROPLASTY Right 11/27/2013   Procedure: UNICOMPARTMENTAL RIGHT KNEE, Steroid injection in right great toe;  Surgeon: Mauri Pole, MD;  Location: [facility] ORS;  Service: Orthopedics;  Laterality: Right;    Social History:  Social History   Socioeconomic History    Marital status: Married    Spouse name: Not on file   Number of children: Not on file   Years of education: Not on file   Highest education level: Not on file  Occupational History   Not on file  Tobacco Use   Smoking status: Never   Smokeless tobacco: Never  Vaping Use   Vaping Use: Never used  Substance and Sexual Activity   Alcohol use: No   Drug use: No   Sexual activity: Yes    Birth control/protection: Surgical  Other Topics Concern   Not on file  Social History Narrative   Not on file   Social Determinants of Health   Financial Resource Strain: Not on file  Food Insecurity: Not on file  Transportation Needs: No Transportation Needs   Lack of Transportation (Medical): No   Lack of Transportation (Non-Medical): No  Physical Activity: Not on file  Stress: Not on file  Social Connections: Not on file  Intimate Partner Violence: Not on file    Family History:  Family History  Problem Relation Age of Onset   Hypertension Father    Diabetes Father    Cancer - Lung Father    Other Mother        Brain tumor   Breast cancer Paternal Aunt        Age 60's   Breast cancer Paternal Grandmother        Age 100    Medications:   Current Outpatient Medications on File Prior to Visit  Medication Sig Dispense Refill   ALPRAZolam (XANAX) 0.25 MG tablet Take 0.25 mg by mouth at bedtime as needed for anxiety or sleep.     atorvastatin (LIPITOR) 20 MG tablet Take 1 tablet (20 mg total) by mouth daily at 6 PM. (Patient taking differently: Take 20 mg by mouth every evening.) 30 tablet 2   cholecalciferol (VITAMIN D) 1000 UNITS tablet Take 1,000 Units by mouth every evening.     clopidogrel (PLAVIX) 75 MG tablet Take 1 tablet (75 mg total) by mouth daily. (Patient taking differently: Take 75 mg by mouth every evening.) 30 tablet 2   diphenhydrAMINE (BENADRYL) 25 mg capsule Take 25 mg by mouth every 6 (six) hours as needed for allergies.     escitalopram (LEXAPRO) 10 MG tablet Take  10 mg by mouth at bedtime.     famotidine (PEPCID) 20 MG tablet Take 20 mg by mouth daily as needed for heartburn or indigestion.     fluticasone (FLONASE) 50 MCG/ACT nasal spray Place 1 spray into both nostrils daily as needed for allergies or rhinitis.     KLOR-CON M20 20 MEQ tablet Take 20 mEq by mouth every evening.     loperamide (IMODIUM A-D) 2 MG tablet Take 2 mg by mouth 4 (four) times daily as needed for diarrhea or loose stools.     Multiple Vitamin (MULTIVITAMIN) tablet Take 1 tablet by mouth every evening.     valsartan-hydrochlorothiazide (DIOVAN-HCT) 160-25 MG tablet Take 1 tablet by mouth every evening.     vitamin B-12 (CYANOCOBALAMIN)  100 MCG tablet Take 100 mcg by mouth every evening.     No current facility-administered medications on file prior to visit.    Allergies:   Allergies  Allergen Reactions   Codeine Nausea And Vomiting   Zofran [Ondansetron]       OBJECTIVE:  Physical Exam  Vitals:   10/22/21 1506  BP: 134/82  Pulse: 70  SpO2: 98%  Weight: 156 lb (70.8 kg)  Height: 5' 2.5" (1.588 m)   Body mass index is 28.08 kg/m. No results found.  Post stroke PHQ 2/9 Depression screen PHQ 2/9 09/16/2021  Decreased Interest 0  Down, Depressed, Hopeless 0  PHQ - 2 Score 0     General: well developed, well nourished, very pleasant elderly Caucasian female, seated, in no evident distress Head: head normocephalic and atraumatic.   Neck: supple with no carotid or supraclavicular bruits Cardiovascular: regular rate and rhythm, no murmurs Musculoskeletal: no deformity Skin:  no rash/petichiae Vascular:  Normal pulses all extremities   Neurologic Exam Mental Status: Awake and fully alert. Fluent speech and language.  Oriented to place and time. Recent and remote memory intact. Attention span, concentration and fund of knowledge appropriate. Mood and affect appropriate.  Cranial Nerves: Fundoscopic exam reveals sharp disc margins. Pupils equal, briskly  reactive to light. Extraocular movements full without nystagmus. Visual fields full to confrontation. Hearing intact. Facial sensation intact. Face, tongue, palate moves normally and symmetrically.  Motor: Normal bulk and tone. Normal strength in all tested extremity muscles Sensory.: intact to touch , pinprick , position and vibratory sensation.  Coordination: Rapid alternating movements normal in all extremities. Finger-to-nose and heel-to-shin performed accurately bilaterally. Gait and Station: Arises from chair without difficulty. Stance is normal. Gait demonstrates normal stride length and mild imbalance/unsteadiness greater with turns without AD.  Difficulty performing tandem walk and heel toe.  Reflexes: 1+ and symmetric. Toes downgoing.     NIHSS  0 Modified Rankin  2      ASSESSMENT: VELEKA DJORDJEVIC is a 75 y.o. year old female with embolic TIA s/p tNK administration with resolution of symptoms on 09/11/2021. 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      PLAN:  TIA vs aborted stroke s/p tNK:  Residual deficit: chronic imbalance post brainstem stroke slightly worse compared to baseline - continue working with PT for hopeful ongoing improvement.  Complete 30-day cardiac event monitor this week. If negative, would recommend proceeding with loop recorder placement. Has f/u with cardiology 12/2  Continue clopidogrel 75 mg daily  and atorvastatin 20 mg daily for secondary stroke prevention.   Discussed secondary stroke prevention measures and importance of close PCP follow up for aggressive stroke risk factor management. I have gone over the pathophysiology of stroke, warning signs and symptoms, risk factors and their management in some detail with instructions to go to the closest emergency room for symptoms of concern. HTN: BP goal <130/90.  Stable on current regimen per PCP HLD: LDL goal <70. Recent LDL 45 on  atorvastatin 20 mg daily per PCP.     Follow up in 6 months or call earlier if needed   CC:  [place] provider: Dr. Leonie Man PCP: Lavone Orn, MD    I spent 56 minutes of face-to-face and non-face-to-face time with patient.  This included previsit chart review including review of recent hospitalization, lab review, study review, electronic health record documentation, patient education regarding recent stroke including possible etiology, secondary stroke prevention  measures and importance of managing stroke risk factors, residual deficits and typical recovery time and answered all other questions to patient satisfaction   Frann Rider, [facility]  [facility] [address] [place], [place] [address]  Phone [phone] Fax [phone] Note: This document was prepared with digital dictation and possible smart phrase technology. Any transcriptional errors that result from this process are unintentional.

## 2021-10-24 NOTE — Progress Notes (Signed)
I agree with the above plan 

## 2021-10-27 ENCOUNTER — Other Ambulatory Visit: Payer: Self-pay | Admitting: Student

## 2021-10-27 DIAGNOSIS — M1711 Unilateral primary osteoarthritis, right knee: Secondary | ICD-10-CM | POA: Diagnosis not present

## 2021-10-27 DIAGNOSIS — I63012 Cerebral infarction due to thrombosis of left vertebral artery: Secondary | ICD-10-CM

## 2021-10-27 DIAGNOSIS — M1712 Unilateral primary osteoarthritis, left knee: Secondary | ICD-10-CM | POA: Diagnosis not present

## 2021-10-27 DIAGNOSIS — I493 Ventricular premature depolarization: Secondary | ICD-10-CM

## 2021-10-27 DIAGNOSIS — I639 Cerebral infarction, unspecified: Secondary | ICD-10-CM

## 2021-10-27 DIAGNOSIS — I4891 Unspecified atrial fibrillation: Secondary | ICD-10-CM

## 2021-10-27 DIAGNOSIS — M25559 Pain in unspecified hip: Secondary | ICD-10-CM | POA: Diagnosis not present

## 2021-10-27 NOTE — Progress Notes (Signed)
CARDIOLOGY CONSULT NOTE       Patient ID: Jordan Lane MRN: [id number] DOB/AGE: [birth year] 75 y.o.  Admit date: (Not on file) Referring Physician: Laurann Montana Primary Physician: Lavone Orn, MD Primary Cardiologist: New Reason for Consultation: Palpitaitons  Active Problems:   * No active hospital problems. *   HPI:  75 y.o. 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   DJD (degenerative joint disease)    knees, toe, right hip   Fibroadenoma of breast 2011   Right   Fractured shoulder 2018   right   GERD (gastroesophageal reflux disease)    H/O colonoscopy    tubular adenoma   History of blood transfusion 52 yrs ago   Hypertension    IBS (irritable bowel syndrome)    Impaired fasting glucose    Left wrist fracture 06/2009   Lower GI bleed 2010   Osteopenia 2007   DEXA scan 1.7 hip   Plantar fasciitis of right foot    Positive colorectal cancer screening using Cologuard test 11/2018   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   Postcholecystectomy diarrhea    Reflux    Seasonal allergic rhinitis    Shingles 2017   R V1   Stroke ([place]) 10/2015    Family History  Problem Relation Age of Onset   Hypertension Mother    Other Mother        Brain tumor   Osteoporosis Mother    Asthma Mother    Coronary artery disease Mother    Hypertension Father    Diabetes Father    Cancer - Lung Father    Colon polyps Father    Anemia Father    Osteoporosis Sister    Colon polyps Sister     Glaucoma Sister    Hyperthyroidism Sister    Rheum arthritis Sister    Breast cancer Paternal Aunt        Age 27's   Breast cancer Paternal Grandmother        Age 42    Social History   Socioeconomic History   Marital status: Married    Spouse name: Not on file   Number of children: 3   Years of education: Not on file   Highest education level: Not on file  Occupational History   Occupation: Retired  Tobacco Use   Smoking status: Never   Smokeless tobacco: Never  Vaping Use   Vaping Use: Never used  Substance and Sexual Activity   Alcohol use: No   Drug use: No   Sexual activity: Yes    Birth control/protection: Surgical  Other Topics Concern   Not on file  Social History Narrative   Not on file   Social Determinants of Health   Financial Resource Strain: Not on file  Food Insecurity: No Food Insecurity   Worried About Running Out of Food in the Last Year: Never true   [place] in the Last Year: Never  true  Transportation Needs: No Transportation Needs   Lack of Transportation (Medical): No   Lack of Transportation (Non-Medical): No  Physical Activity: Not on file  Stress: Not on file  Social Connections: Not on file  Intimate Partner Violence: Not on file    Past Surgical History:  Procedure Laterality Date   ABDOMINAL HYSTERECTOMY  1984   TAH, partial   BREAST BIOPSY Right 2011   CATARACT EXTRACTION, BILATERAL Bilateral 2021   CHOLECYSTECTOMY  2009   ESOPHAGOGASTRODUODENOSCOPY  11/14/2012   Procedure: ESOPHAGOGASTRODUODENOSCOPY (EGD);  Surgeon: Wonda Horner, MD;  Location[facility] ENDOSCOPY;  Service: Endoscopy;  Laterality: N/A;   ESOPHAGOGASTRODUODENOSCOPY (EGD) WITH PROPOFOL N/A 09/21/2016   Procedure: ESOPHAGOGASTRODUODENOSCOPY (EGD) WITH PROPOFOL;  Surgeon: Garlan Fair, MD;  Location: [facility] ENDOSCOPY;  Service: Endoscopy;  Laterality: N/A;   ESOPHAGOGASTRODUODENOSCOPY (EGD) WITH PROPOFOL N/A 12/17/2020   Procedure: ESOPHAGOGASTRODUODENOSCOPY  (EGD) WITH PROPOFOL;  Surgeon: Ronald Lobo, MD;  Location: [facility] ENDOSCOPY;  Service: Endoscopy;  Laterality: N/A;   Herniated disc  1987   lower back    KNEE SURGERY  left 2010, right 2013   Arthroscopic   LUMBAR LAMINECTOMY Left    PARTIAL KNEE ARTHROPLASTY  06/14/2012   Procedure: UNICOMPARTMENTAL KNEE;  Surgeon: Mauri Pole, MD;  Location: [facility] ORS;  Service: Orthopedics;  Laterality: Left;  Left Medial Unicompartmental Knee   PARTIAL KNEE ARTHROPLASTY Right 11/27/2013   Procedure: UNICOMPARTMENTAL RIGHT KNEE, Steroid injection in right great toe;  Surgeon: Mauri Pole, MD;  Location: [facility] ORS;  Service: Orthopedics;  Laterality: Right;      Current Outpatient Medications:    ALPRAZolam (XANAX) 0.25 MG tablet, Take 0.25 mg by mouth at bedtime as needed for anxiety or sleep., Disp: , Rfl:    atorvastatin (LIPITOR) 20 MG tablet, Take 1 tablet (20 mg total) by mouth daily at 6 PM. (Patient taking differently: Take 20 mg by mouth every evening.), Disp: 30 tablet, Rfl: 2   cholecalciferol (VITAMIN D) 1000 UNITS tablet, Take 1,000 Units by mouth every evening., Disp: , Rfl:    clopidogrel (PLAVIX) 75 MG tablet, Take 1 tablet (75 mg total) by mouth daily. (Patient taking differently: Take 75 mg by mouth every evening.), Disp: 30 tablet, Rfl: 2   diphenhydrAMINE (BENADRYL) 25 mg capsule, Take 25 mg by mouth every 6 (six) hours as needed for allergies., Disp: , Rfl:    escitalopram (LEXAPRO) 10 MG tablet, Take 10 mg by mouth at bedtime., Disp: , Rfl:    famotidine (PEPCID) 20 MG tablet, Take 20 mg by mouth daily as needed for heartburn or indigestion., Disp: , Rfl:    fluticasone (FLONASE) 50 MCG/ACT nasal spray, Place 1 spray into both nostrils daily as needed for allergies or rhinitis., Disp: , Rfl:    KLOR-CON M20 20 MEQ tablet, Take 20 mEq by mouth every evening., Disp: , Rfl:    Lidocaine HCl (ASPERCREME LIDOCAINE) 4 % LIQD, Apply 1 application topically as directed., Disp: , Rfl:     loperamide (IMODIUM A-D) 2 MG tablet, Take 2 mg by mouth 4 (four) times daily as needed for diarrhea or loose stools., Disp: , Rfl:    Multiple Vitamin (MULTIVITAMIN) tablet, Take 1 tablet by mouth every evening., Disp: , Rfl:    omeprazole (PRILOSEC) 20 MG capsule, Take 1 capsule by mouth 2 (two) times daily., Disp: , Rfl:    valsartan-hydrochlorothiazide (DIOVAN-HCT) 160-25 MG tablet, Take 1 tablet by mouth every evening., Disp: , Rfl:    vitamin B-12 (  CYANOCOBALAMIN) 100 MCG tablet, Take 100 mcg by mouth every evening., Disp: , Rfl:     Physical Exam: Blood pressure 132/68, pulse 81, height 5' 2.5" (1.588 m), weight 158 lb (71.7 kg), SpO2 98 %.    Affect appropriate Healthy:  appears stated age 44: normal Neck supple with no adenopathy JVP normal no bruits no thyromegaly Lungs clear with no wheezing and good diaphragmatic motion Heart:  S1/S2 no murmur, no rub, gallop or click PMI normal Abdomen: benighn, BS positve, no tenderness, no AAA no bruit.  No HSM or HJR Distal pulses intact with no bruits No edema Neuro  non focal Skin warm and dry No muscular weakness   Labs:   Lab Results  Component Value Date   WBC 10.3 09/11/2021   HGB 15.0 09/11/2021   HCT 44.0 09/11/2021   MCV 93.2 09/11/2021   PLT 286 09/11/2021   No results for input(s): NA, K, CL, CO2, BUN, CREATININE, CALCIUM, PROT, BILITOT, ALKPHOS, ALT, AST, GLUCOSE in the last 168 hours.  Invalid input(s): LABALBU Lab Results  Component Value Date   CKTOTAL 139 06/14/2012   CKMB 2.4 06/14/2012   TROPONINI <0.30 06/14/2012    Lab Results  Component Value Date   CHOL 125 09/12/2021   CHOL 195 10/22/2015   Lab Results  Component Value Date   HDL 48 09/12/2021   HDL 43 10/22/2015   Lab Results  Component Value Date   LDLCALC 45 09/12/2021   LDLCALC 114 (H) 10/22/2015   Lab Results  Component Value Date   TRIG 160 (H) 09/12/2021   TRIG 192 (H) 10/22/2015   Lab Results  Component Value Date    CHOLHDL 2.6 09/12/2021   CHOLHDL 4.5 10/22/2015   No results found for: LDLDIRECT    Radiology: CARDIAC EVENT MONITOR  Result Date: 10/28/2021 NSR One run atrial tachycardia vs flutter No afib PAC;s Jenkins Rouge MD [facility]   EKG: SR rate 70 ICRBBB 09/11/21   ASSESSMENT AND PLAN:   TIA:  post tNK with negative MRI and EEG. On DAPT 3 weeks now just plavix Monitor  With ? Short burst of flutter Refer to EP for ILR  HTN:  continue diovan HCT stable  HLD:  continue statin  GERD:  on protonix stable   F/U PRN   Signed: Jenkins Rouge 11/03/2021, 8:04 AM

## 2021-10-28 ENCOUNTER — Encounter: Payer: Self-pay | Admitting: Physical Therapy

## 2021-10-28 ENCOUNTER — Ambulatory Visit: Payer: Medicare HMO | Admitting: Physical Therapy

## 2021-10-28 ENCOUNTER — Other Ambulatory Visit: Payer: Self-pay

## 2021-10-28 DIAGNOSIS — R2681 Unsteadiness on feet: Secondary | ICD-10-CM | POA: Diagnosis not present

## 2021-10-28 DIAGNOSIS — R2689 Other abnormalities of gait and mobility: Secondary | ICD-10-CM

## 2021-10-28 DIAGNOSIS — M6281 Muscle weakness (generalized): Secondary | ICD-10-CM | POA: Diagnosis not present

## 2021-10-28 NOTE — Patient Instructions (Signed)
Access Code: [email or web address] URL: [email or web address] Date: 10/28/2021 Prepared by: Janann August  Exercises Heel Toe Raises with Counter Support - 1 x daily - 5 x weekly - 2 sets - 10 reps Standing Marching - 1-2 x daily - 5 x weekly - 3 sets Tandem Walking with Counter Support - 1-2 x daily - 5 x weekly - 3 sets Romberg Stance Eyes Closed on Foam Pad - 1-2 x daily - 5 x weekly - 3 sets - 30 hold Wide Stance with Eyes Closed and Head Rotation on Foam Pad - 1-2 x daily - 5 x weekly - 2 sets - 10 reps Standing Single Leg Stance with Counter Support - 1 x daily - 5 x weekly - 3 sets - 30 hold

## 2021-10-28 NOTE — Therapy (Addendum)
[facility] [address] [place], [place], [address] Phone: [phone]   Fax:  [phone]  Physical Therapy Treatment  Patient Details  Name: Jordan Lane MRN: [id number] Date of Birth: 1946-04-02 Referring Provider (PT): Garvin Fila, MD   Encounter Date: 10/28/2021   PT End of Session - 10/28/21 1537     Visit Number 5    Number of Visits 9    Date for PT Re-Evaluation 11/28/21    PT Start Time 9381    PT Stop Time 0175    PT Time Calculation (min) 40 min    Equipment Utilized During Treatment Gait belt    Activity Tolerance Patient tolerated treatment well    Behavior During Therapy [facility] for tasks assessed/performed             Past Medical History:  Diagnosis Date   Arthritis    Fibroadenoma of breast    Right   GERD (gastroesophageal reflux disease)    History of blood transfusion 52 yrs ago   Hypertension    Reflux    Stroke ([place]) 10/2015    Past Surgical History:  Procedure Laterality Date   ABDOMINAL HYSTERECTOMY  1984   TAH, partial   CHOLECYSTECTOMY  2009   ESOPHAGOGASTRODUODENOSCOPY  11/14/2012   Procedure: ESOPHAGOGASTRODUODENOSCOPY (EGD);  Surgeon: Wonda Horner, MD;  Location: [facility] ENDOSCOPY;  Service: Endoscopy;  Laterality: N/A;   ESOPHAGOGASTRODUODENOSCOPY (EGD) WITH PROPOFOL N/A 09/21/2016   Procedure: ESOPHAGOGASTRODUODENOSCOPY (EGD) WITH PROPOFOL;  Surgeon: Garlan Fair, MD;  Location: [facility] ENDOSCOPY;  Service: Endoscopy;  Laterality: N/A;   ESOPHAGOGASTRODUODENOSCOPY (EGD) WITH PROPOFOL N/A 12/17/2020   Procedure: ESOPHAGOGASTRODUODENOSCOPY (EGD) WITH PROPOFOL;  Surgeon: Ronald Lobo, MD;  Location: [facility] ENDOSCOPY;  Service: Endoscopy;  Laterality: N/A;   Herniated disc  1987   lower back    KNEE SURGERY  left 2010, right 2013   Arthroscopic   PARTIAL KNEE ARTHROPLASTY  06/14/2012   Procedure: UNICOMPARTMENTAL KNEE;  Surgeon: Mauri Pole, MD;  Location: [facility] ORS;   Service: Orthopedics;  Laterality: Left;  Left Medial Unicompartmental Knee   PARTIAL KNEE ARTHROPLASTY Right 11/27/2013   Procedure: UNICOMPARTMENTAL RIGHT KNEE, Steroid injection in right great toe;  Surgeon: Mauri Pole, MD;  Location: [facility] ORS;  Service: Orthopedics;  Laterality: Right;    There were no vitals filed for this visit.   Subjective Assessment - 10/28/21 1536     Subjective Exercises are going well. Planning on going back to the [facility] next month.    Patient is accompained by: Family member   pt's spouse Shanon Brow   Pertinent History PMH: hypertension, GERD, hyperlipidemia, breast CA, bil TKR, CVA 2016    Limitations Walking    Patient Stated Goals improve balance    Currently in Pain? No/denies                [facility] PT Assessment - 10/28/21 1537       Ambulation/Gait   Ambulation/Gait Yes    Ambulation/Gait Assistance 5: Supervision    Assistive device None    Gait Pattern Step-through pattern;Right foot flat;Left foot flat;Decreased dorsiflexion - right;Decreased dorsiflexion - left    Ambulation Surface Level;Indoor    Gait velocity 13.09 seconds = 2.51 ft/sec      High Level Balance   High Level Balance Comments SLS: RLE = 1-2 seconds, LLE= <1 second      Functional Gait  Assessment   Gait assessed  Yes    Gait  Level Surface Walks 20 ft, slow speed, abnormal gait pattern, evidence for imbalance or deviates 10-15 in outside of the 12 in walkway width. Requires more than 7 sec to ambulate 20 ft.    Change in Gait Speed Able to change speed, demonstrates mild gait deviations, deviates 6-10 in outside of the 12 in walkway width, or no gait deviations, unable to achieve a major change in velocity, or uses a change in velocity, or uses an assistive device.    Gait with Horizontal Head Turns Performs head turns smoothly with no change in gait. Deviates no more than 6 in outside 12 in walkway width    Gait with Vertical Head Turns Performs head turns with no change in  gait. Deviates no more than 6 in outside 12 in walkway width.    Gait and Pivot Turn Pivot turns safely within 3 sec and stops quickly with no loss of balance.    Step Over Obstacle Is able to step over 2 stacked shoe boxes taped together (9 in total height) without changing gait speed. No evidence of imbalance.    Gait with Narrow Base of Support Ambulates 4-7 steps.    Gait with Eyes Closed Walks 20 ft, uses assistive device, slower speed, mild gait deviations, deviates 6-10 in outside 12 in walkway width. Ambulates 20 ft in less than 9 sec but greater than 7 sec.   8.17   Ambulating Backwards Walks 20 ft, uses assistive device, slower speed, mild gait deviations, deviates 6-10 in outside 12 in walkway width.   14.37   Steps Alternating feet, no rail.    Total Score 23    FGA comment: medium fall risk                     Access Code: [email or web address] URL: [email or web address] Date: 10/28/2021 Prepared by: Janann August  Reviewed and updated pt's HEP:   Exercises Heel Toe Raises with Counter Support - 1 x daily - 5 x weekly - 2 sets - 10 reps Standing Marching - 1-2 x daily - 5 x weekly - 3 sets Tandem Walking with Counter Support - 1-2 x daily - 5 x weekly - 3 sets Romberg Stance Eyes Closed on Foam Pad - 1-2 x daily - 5 x weekly - 3 sets - 30 hold Wide Stance with Eyes Closed and Head Rotation on Foam Pad - 1-2 x daily - 5 x weekly - 2 sets - 10 reps - pt had previously not been performing, 2 x 10 reps head turns, 2 x 10 reps head nods Standing Single Leg Stance with Counter Support - 1 x daily - 5 x weekly - 3 sets - 10 sec hold - *new addition, with fingertip support and trying to let go for a couple seconds before touching counter again.        [facility] Adult PT Treatment/Exercise - 10/28/21 1537       Therapeutic Activites    Therapeutic Activities Other Therapeutic Activities    Other Therapeutic Activities Worked on proper squat technique as pt reports  feeling more off balanced when bending down. Pt demonstrating incr lumbar flexion when bending over. Demonstrated/educated on proper squat technique with wider BOS with pt performing x7 reps. Pt reporting feeling more stable performing this way.                     PT Education - 10/28/21 1615     Education Details Progress towards goals, reviewed HEP,  proper squat technique.    Person(s) Educated Patient    Methods Explanation;Demonstration;Handout    Comprehension Verbalized understanding;Returned demonstration              PT Short Term Goals - 09/29/21 1445       PT SHORT TERM GOAL #1   Title ALL STGS =LTGS               PT Long Term Goals - 10/28/21 1550       PT LONG TERM GOAL #1   Title Pt will be independent with final HEP in order to build upon functional gains made in therapy. ALL LTGS DUE 10/27/21    Time 4    Period Weeks    Status Partially Met      PT LONG TERM GOAL #2   Title Pt will improve FGA score to at least a 23/30 in order to demo decr fall risk.    Baseline 20/30, 23/30 on 10/28/21    Time 4    Period Weeks    Status Achieved      PT LONG TERM GOAL #3   Title Pt will improve gait speed to at least 2.8 ft/sec in order to demo improved community mobility.    Baseline 2.42 ft/sec, 2.51 ft/sec on 10/28/21    Time 4    Period Weeks    Status Not Met      PT LONG TERM GOAL #4   Title Pt will improve SLS time to at least 5 seconds on both legs in order to demo improved SLS time for IADLs.    Baseline RLE <1 second, LLE 1-2 seconds on 10/28/21    Time 4    Period Weeks    Status Not Met            Ongoing LTGs:      PT Long Term Goals - 10/28/21 1724       PT LONG TERM GOAL #1   Title Pt will be independent with final HEP in order to build upon functional gains made in therapy. ALL LTGS DUE 11/25/21    Time 4    Period Weeks    Status New    Target Date 11/25/21                Plan - 10/28/21 1720      Clinical Impression Statement Checked LTGs today. Pt met LTG #2 in regards to FGA, improved to 23/30 (previously 20/30). Pt did not meet LTG #3 and #4. Pt's gait speed slightly improved from 2.42 ft/sec > 2.51 ft/sec, but not to goal level. Pt reports her gait speed feels the same as it was before her CVA. Pt did not meet LTG #4 in regards to SLS, unable to hold for >2 seconds. Remainder of session focused on reviewing and adding to HEP. Pt has 2 more scheduled appts, will continue to work on balance and finalize pt's HEP. Pt in agreement with plan.    Personal Factors and Comorbidities Comorbidity 3+;Past/Current Experience;Time since onset of injury/illness/exacerbation    Comorbidities hypertension, GERD, hyperlipidemia, breast CA, bil TKR, arthritis, hx of CVA (10/2015)    Examination-Activity Limitations Stairs;Squat;Locomotion Level    Examination-Participation Restrictions Community Activity    Stability/Clinical Decision Making Stable/Uncomplicated    Rehab Potential Good    PT Frequency 2x / week    PT Duration 8 weeks    PT Treatment/Interventions ADLs/Self Care Home Management;Gait training;Stair training;Therapeutic activities;Functional mobility training;Therapeutic exercise;Balance training;Neuromuscular re-education;Patient/family  education;Passive range of motion;Vestibular    PT Next Visit Plan finalize HEP and D/C this week. PF strengthening, SLS, balance with eyes closed, unlevel surfaces, tandem, retro gait. Continue with high level balance.    PT Home Exercise Plan [email or web address]    Consulted and Agree with Plan of Care Patient             Patient will benefit from skilled therapeutic intervention in order to improve the following deficits and impairments:  Abnormal gait, Decreased balance, Decreased activity tolerance, Decreased endurance, Decreased strength, Difficulty walking  Visit Diagnosis: Unsteadiness on feet  Muscle weakness (generalized)  Other abnormalities of  gait and mobility     Problem List Patient Active Problem List   Diagnosis Date Noted   Acute ischemic left MCA stroke ([place]) 09/11/2021   Palpitations 06/29/2016   Burning pain 01/07/2016   Acute ischemic stroke ([place]) 10/22/2015   HLD (hyperlipidemia)    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 [place], [place], [address] Phone: [phone]   Fax:  [phone]  Name: Jordan Lane MRN: [id number] Date of Birth: 1946-10-30

## 2021-10-31 ENCOUNTER — Other Ambulatory Visit: Payer: Self-pay

## 2021-10-31 ENCOUNTER — Ambulatory Visit: Payer: Medicare HMO | Admitting: Cardiovascular Disease

## 2021-10-31 VITALS — BP 132/68 | HR 81 | Ht 62.5 in | Wt 158.0 lb

## 2021-10-31 DIAGNOSIS — G459 Transient cerebral ischemic attack, unspecified: Secondary | ICD-10-CM

## 2021-10-31 DIAGNOSIS — R002 Palpitations: Secondary | ICD-10-CM | POA: Diagnosis not present

## 2021-10-31 NOTE — Patient Instructions (Signed)
Medication Instructions:  Your physician recommends that you continue on your current medications as directed. Please refer to the Current Medication list given to you today.  *If you need a refill on your cardiac medications before your next appointment, please call your pharmacy*   Lab Work: NONE If you have labs (blood work) drawn today and your tests are completely normal, you will receive your results only by: [place] (if you have MyChart) OR A paper copy in the mail If you have any lab test that is abnormal or we need to change your treatment, we will call you to review the results.   Testing/Procedures: NONE   Follow-Up: At [facility], you and your health needs are our priority.  As part of our continuing mission to provide you with exceptional heart care, we have created designated Provider Care Teams.  These Care Teams include your primary Cardiologist (physician) and Advanced Practice Providers (APPs -  Physician Assistants and Nurse Practitioners) who all work together to provide you with the care you need, when you need it.  We recommend signing up for the patient portal called "MyChart".  Sign up information is provided on this After Visit Summary.  MyChart is used to connect with patients for Virtual Visits (Telemedicine).  Patients are able to view lab/test results, encounter notes, upcoming appointments, etc.  Non-urgent messages can be sent to your provider as well.   To learn more about what you can do with MyChart, go to [email or web address].    Your next appointment:   AS NEEDED  The format for your next appointment:   In Person  Provider:   DR. Johnsie Cancel  Your physician has recommended that you see an electrophysiologist

## 2021-10-31 NOTE — Patient Instructions (Signed)
Patient Goals/Self-Care Activities: Hypertension check blood pressure 3 times per week write blood pressure results in a log or diary take blood pressure log to all doctor appointments call doctor for signs and symptoms of high blood pressure

## 2021-11-04 ENCOUNTER — Encounter: Payer: Self-pay | Admitting: Physical Therapy

## 2021-11-04 ENCOUNTER — Other Ambulatory Visit: Payer: Self-pay

## 2021-11-04 ENCOUNTER — Ambulatory Visit: Payer: Medicare HMO | Attending: Internal Medicine | Admitting: Physical Therapy

## 2021-11-04 DIAGNOSIS — R2689 Other abnormalities of gait and mobility: Secondary | ICD-10-CM | POA: Insufficient documentation

## 2021-11-04 DIAGNOSIS — M6281 Muscle weakness (generalized): Secondary | ICD-10-CM | POA: Diagnosis not present

## 2021-11-04 DIAGNOSIS — R2681 Unsteadiness on feet: Secondary | ICD-10-CM | POA: Diagnosis not present

## 2021-11-04 NOTE — Therapy (Addendum)
[facility] [address] [place], [place], [address] Phone: [phone]   Fax:  [phone]  Physical Therapy Treatment/Discharge Summary  Patient Details  Name: Jordan Lane MRN: [id number] Date of Birth: [birth year] Referring Provider (PT): Micki Riley, MD   Encounter Date: 11/04/2021   PT End of Session - 11/04/21 1238     Visit Number 6    Number of Visits 9    Date for PT Re-Evaluation 11/28/21    PT Start Time 1235    PT Stop Time 1314    PT Time Calculation (min) 39 min    Equipment Utilized During Treatment Gait belt    Activity Tolerance Patient tolerated treatment well    Behavior During Therapy [facility] for tasks assessed/performed             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   DJD (degenerative joint disease)    knees, toe, right hip   Fibroadenoma of breast 2011   Right   Fractured shoulder 2018   right   GERD (gastroesophageal reflux disease)    H/O colonoscopy    tubular adenoma   History of blood transfusion 52 yrs ago   Hypertension    IBS (irritable bowel syndrome)    Impaired fasting glucose    Left wrist fracture 06/2009   Lower GI bleed 2010   Osteopenia 2007   DEXA scan 1.7 hip   Plantar fasciitis of right foot    Positive colorectal cancer screening using Cologuard test 11/2018   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   Postcholecystectomy diarrhea    Reflux    Seasonal allergic rhinitis    Shingles 2017   R V1   Stroke ([facility]) 10/2015    Past Surgical History:  Procedure Laterality Date   ABDOMINAL HYSTERECTOMY  1984   TAH, partial   BREAST BIOPSY Right 2011   CATARACT EXTRACTION, BILATERAL Bilateral 2021   CHOLECYSTECTOMY  2009   ESOPHAGOGASTRODUODENOSCOPY  11/14/2012   Procedure: ESOPHAGOGASTRODUODENOSCOPY (EGD);  Surgeon: Graylin Shiver, MD;  Location[facility] ENDOSCOPY;   Service: Endoscopy;  Laterality: N/A;   ESOPHAGOGASTRODUODENOSCOPY (EGD) WITH PROPOFOL N/A 09/21/2016   Procedure: ESOPHAGOGASTRODUODENOSCOPY (EGD) WITH PROPOFOL;  Surgeon: Charolett Bumpers, MD;  Location: [facility] ENDOSCOPY;  Service: Endoscopy;  Laterality: N/A;   ESOPHAGOGASTRODUODENOSCOPY (EGD) WITH PROPOFOL N/A 12/17/2020   Procedure: ESOPHAGOGASTRODUODENOSCOPY (EGD) WITH PROPOFOL;  Surgeon: Bernette Redbird, MD;  Location: [facility] ENDOSCOPY;  Service: Endoscopy;  Laterality: N/A;   Herniated disc  1987   lower back    KNEE SURGERY  left 2010, right 2013   Arthroscopic   LUMBAR LAMINECTOMY Left    PARTIAL KNEE ARTHROPLASTY  06/14/2012   Procedure: UNICOMPARTMENTAL KNEE;  Surgeon: Shelda Pal, MD;  Location: [facility] ORS;  Service: Orthopedics;  Laterality: Left;  Left Medial Unicompartmental Knee   PARTIAL KNEE ARTHROPLASTY Right 11/27/2013   Procedure: UNICOMPARTMENTAL RIGHT KNEE, Steroid injection in right great toe;  Surgeon: Shelda Pal, MD;  Location: [facility] ORS;  Service: Orthopedics;  Laterality: Right;    There were no vitals filed for this visit.   Subjective Assessment - 11/04/21 1238     Subjective Going to water aerobics later this week at the [facility]. Wants to wrap up with therapy today. Feels like her balance is the same as it was after her last CVA.  Patient is accompained by: Family member   pt's spouse Shanon Brow   Pertinent History PMH: hypertension, GERD, hyperlipidemia, breast CA, bil TKR, CVA 2016    Limitations Walking    Patient Stated Goals improve balance    Currently in Pain? No/denies                               [facility] Adult PT Treatment/Exercise - 11/04/21 1244       Exercises   Exercises Knee/Hip      Knee/Hip Exercises: Aerobic   Stepper SciFit with BUE/BLE for strengthening/activity tolerance at gear 2.0 for 8 minutes. Discussed with pt using a seated stepper at the [facility] for aerobic activity/strengthening after D/C from therapy.                  Balance Exercises - 11/04/21 1258       Balance Exercises: Standing   Standing Eyes Closed Foam/compliant surface;Narrow base of support (BOS);Limitations    Standing Eyes Closed Limitations On air ex; 3 x 30 seconds, mild postural sway    Tandem Stance Eyes open;Intermittent upper extremity support;3 reps;Foam/compliant surface;Limitations    Tandem Stance Time performed x3 reps bilat in corner, incr difficulty with LLE posteriorly    SLS with Vectors Foam/compliant surface    SLS with Vectors Limitations standing on blue mat, alternating soccer ball taps, 2 x 10 reps each leg    Rockerboard Anterior/posterior;Limitations    Rockerboard Limitations 2 x10 reps head turns, 2 x10 reps head nods - intermittent UE support for balance, incr difficulty with nods.    Tandem Gait Forward;4 reps;Limitations    Tandem Gait Limitations Down and back on blue mat x4 reps with cues for slowed and controlled, intermittent UE support    Marching Foam/compliant surface;5 reps;Limitations    Marching Limitations Forwards and backwards on blue mat, cues for slowed and controlled/incr SLS time x4 reps                PT Education - 11/04/21 1316     Education Details Importance of continuing with HEP    Person(s) Educated Patient;Spouse    Methods Explanation    Comprehension Verbalized understanding              PT Short Term Goals - 09/29/21 1445       PT SHORT TERM GOAL #1   Title ALL STGS =LTGS               PT Long Term Goals - 11/04/21 1241       PT LONG TERM GOAL #1   Title Pt will be independent with final HEP in order to build upon functional gains made in therapy. ALL LTGS DUE 11/25/21    Baseline pt reports feeling comfortable with HEP and will continue to perform at home.    Time 4    Period Weeks    Status Achieved    Target Date 11/25/21            PHYSICAL THERAPY DISCHARGE SUMMARY  Visits from Start of Care: 6  Current functional level  related to goals / functional outcomes: See clinical impression statement.  10/28/21: FGA 23/30, gait speed 2.51 ft/sec with no AD (pt reports this was how her gait speed felt before the stroke)   Remaining deficits: Impaired high level balance.   Education / Equipment: HEP   Patient agrees to discharge. Patient goals were partially  met. Patient is being discharged due to being pleased with the current functional level/pt's request.         Plan - 11/04/21 1419     Clinical Impression Statement Pt wishing to wrap up with PT today and D/C. Updated pt's HEP at last session with pt not having any questions about it. Verbalizes understanding of continuing to perform at home. Pt is planning to return to water aerobics at the [facility]. Tried the SciFit today as a means of aerobic exercises for pt to perform at the gym for aerobic activity/strengthening when discharged. Discussed if pt wishes to return to therapy in the future then she will need a new order.    Personal Factors and Comorbidities Comorbidity 3+;Past/Current Experience;Time since onset of injury/illness/exacerbation    Comorbidities hypertension, GERD, hyperlipidemia, breast CA, bil TKR, arthritis, hx of CVA (10/2015)    Examination-Activity Limitations Stairs;Squat;Locomotion Level    Examination-Participation Restrictions Community Activity    Stability/Clinical Decision Making Stable/Uncomplicated    Rehab Potential Good    PT Frequency 2x / week    PT Duration 8 weeks    PT Treatment/Interventions ADLs/Self Care Home Management;Gait training;Stair training;Therapeutic activities;Functional mobility training;Therapeutic exercise;Balance training;Neuromuscular re-education;Patient/family education;Passive range of motion;Vestibular    PT Next Visit Plan D/C    PT Home Exercise Plan [email or web address]    Consulted and Agree with Plan of Care Patient             Patient will benefit from skilled therapeutic intervention in order to  improve the following deficits and impairments:  Abnormal gait, Decreased balance, Decreased activity tolerance, Decreased endurance, Decreased strength, Difficulty walking  Visit Diagnosis: Unsteadiness on feet  Muscle weakness (generalized)  Other abnormalities of gait and mobility     Problem List Patient Active Problem List   Diagnosis Date Noted   Acute ischemic left MCA stroke ([place]) 09/11/2021   Palpitations 06/29/2016   Burning pain 01/07/2016   Acute ischemic stroke ([place]) 10/22/2015   HLD (hyperlipidemia)    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 [place], [place], [address] Phone: [phone]   Fax:  [phone]  Name: DULCE MARTIAN MRN: [id number] Date of Birth: 02/17/1946

## 2021-11-07 ENCOUNTER — Ambulatory Visit: Payer: Medicare HMO | Admitting: Physical Therapy

## 2021-11-19 DIAGNOSIS — K219 Gastro-esophageal reflux disease without esophagitis: Secondary | ICD-10-CM | POA: Diagnosis not present

## 2021-11-19 DIAGNOSIS — I10 Essential (primary) hypertension: Secondary | ICD-10-CM | POA: Diagnosis not present

## 2021-11-19 DIAGNOSIS — G459 Transient cerebral ischemic attack, unspecified: Secondary | ICD-10-CM | POA: Diagnosis not present

## 2021-11-19 DIAGNOSIS — E782 Mixed hyperlipidemia: Secondary | ICD-10-CM | POA: Diagnosis not present

## 2021-11-19 DIAGNOSIS — M8580 Other specified disorders of bone density and structure, unspecified site: Secondary | ICD-10-CM | POA: Diagnosis not present

## 2021-12-17 ENCOUNTER — Other Ambulatory Visit: Payer: Self-pay

## 2021-12-17 NOTE — Patient Outreach (Signed)
[facility]) Care Management  12/17/2021  ZIONNA HOMEWOOD 1946-10-03 [id number]   CMA made telephone outreach to cancel patients upcoming appointment as care coordinator Jon Billings, RN is out of the office. Patient aware RN will call back to reschedule appointment next month once she returns.  [name] [facility] Management Assistant [phone]

## 2021-12-19 ENCOUNTER — Encounter: Payer: Self-pay | Admitting: Cardiology

## 2021-12-19 ENCOUNTER — Other Ambulatory Visit: Payer: Self-pay

## 2021-12-19 ENCOUNTER — Ambulatory Visit: Payer: Self-pay

## 2021-12-19 ENCOUNTER — Ambulatory Visit: Payer: Medicare HMO | Admitting: Cardiology

## 2021-12-19 VITALS — BP 134/70 | HR 76 | Ht 62.0 in | Wt 155.8 lb

## 2021-12-19 DIAGNOSIS — I639 Cerebral infarction, unspecified: Secondary | ICD-10-CM

## 2021-12-19 NOTE — Progress Notes (Signed)
Electrophysiology Office Note   Date:  12/19/2021   ID:  Jordan Lane, DOB [birth year], MRN [id number]  PCP:  Lavone Orn, MD  Cardiologist:  Johnsie Cancel Primary Electrophysiologist:  Calvin Chura Meredith Leeds, MD    Chief Complaint: CVA   History of Present Illness: Jordan Lane is a 76 y.o. female who is being seen today for the evaluation of CVA at the request of Josue Hector, MD. Presenting today for electrophysiology evaluation.  She has a history significant for hypertension.  She presented to the hospital 09/11/2021 with sudden aphasia and right-sided anopia.  She had TN K administrated which resolved her findings.  She was recommended to have a 30-day monitor.  Monitor did not show evidence of atrial fibrillation.  In 2016, she had an episode where she was only walking to the left.  She went to the hospital was noted to have a stroke at that point.  Today, she denies symptoms of palpitations, chest pain, shortness of breath, orthopnea, PND, lower extremity edema, claudication, dizziness, presyncope, syncope, bleeding, or neurologic sequela. The patient is tolerating medications without difficulties.    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   DJD (degenerative joint disease)    knees, toe, right hip   Fibroadenoma of breast 2011   Right   Fractured shoulder 2018   right   GERD (gastroesophageal reflux disease)    H/O colonoscopy    tubular adenoma   History of blood transfusion 52 yrs ago   Hypertension    IBS (irritable bowel syndrome)    Impaired fasting glucose    Left wrist fracture 06/2009   Lower GI bleed 2010   Osteopenia 2007   DEXA scan 1.7 hip   Plantar fasciitis of right foot    Positive colorectal cancer screening using Cologuard test 11/2018   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   Postcholecystectomy diarrhea    Reflux    Seasonal allergic rhinitis    Shingles  2017   R V1   Stroke ([place]) 10/2015   Past Surgical History:  Procedure Laterality Date   ABDOMINAL HYSTERECTOMY  1984   TAH, partial   BREAST BIOPSY Right 2011   CATARACT EXTRACTION, BILATERAL Bilateral 2021   CHOLECYSTECTOMY  2009   ESOPHAGOGASTRODUODENOSCOPY  11/14/2012   Procedure: ESOPHAGOGASTRODUODENOSCOPY (EGD);  Surgeon: Wonda Horner, MD;  Location: [facility] ENDOSCOPY;  Service: Endoscopy;  Laterality: N/A;   ESOPHAGOGASTRODUODENOSCOPY (EGD) WITH PROPOFOL N/A 09/21/2016   Procedure: ESOPHAGOGASTRODUODENOSCOPY (EGD) WITH PROPOFOL;  Surgeon: Garlan Fair, MD;  Location: [facility] ENDOSCOPY;  Service: Endoscopy;  Laterality: N/A;   ESOPHAGOGASTRODUODENOSCOPY (EGD) WITH PROPOFOL N/A 12/17/2020   Procedure: ESOPHAGOGASTRODUODENOSCOPY (EGD) WITH PROPOFOL;  Surgeon: Ronald Lobo, MD;  Location: [facility] ENDOSCOPY;  Service: Endoscopy;  Laterality: N/A;   Herniated disc  1987   lower back    KNEE SURGERY  left 2010, right 2013   Arthroscopic   LUMBAR LAMINECTOMY Left    PARTIAL KNEE ARTHROPLASTY  06/14/2012   Procedure: UNICOMPARTMENTAL KNEE;  Surgeon: Mauri Pole, MD;  Location: [facility] ORS;  Service: Orthopedics;  Laterality: Left;  Left Medial Unicompartmental Knee   PARTIAL KNEE ARTHROPLASTY Right 11/27/2013   Procedure: UNICOMPARTMENTAL RIGHT KNEE, Steroid injection in right great toe;  Surgeon: Mauri Pole, MD;  Location: [facility] ORS;  Service: Orthopedics;  Laterality: Right;     Current Outpatient Medications  Medication Sig Dispense Refill   ALPRAZolam (XANAX) 0.25 MG tablet Take 0.25 mg by mouth at bedtime as needed for anxiety or sleep.     atorvastatin (LIPITOR) 20 MG tablet Take 1 tablet (20 mg total) by mouth daily at 6 PM. (Patient taking differently: Take 20 mg by mouth every evening.) 30 tablet 2   cholecalciferol (VITAMIN D) 1000 UNITS tablet Take 1,000 Units by mouth every evening.     clopidogrel (PLAVIX) 75 MG tablet Take 1 tablet (75 mg total) by mouth daily. (Patient taking  differently: Take 75 mg by mouth every evening.) 30 tablet 2   diphenhydrAMINE (BENADRYL) 25 mg capsule Take 25 mg by mouth every 6 (six) hours as needed for allergies.     escitalopram (LEXAPRO) 10 MG tablet Take 10 mg by mouth at bedtime.     fluticasone (FLONASE) 50 MCG/ACT nasal spray Place 1 spray into both nostrils daily as needed for allergies or rhinitis.     KLOR-CON M20 20 MEQ tablet Take 20 mEq by mouth every evening.     Lidocaine HCl (ASPERCREME LIDOCAINE) 4 % LIQD Apply 1 application topically as directed.     loperamide (IMODIUM A-D) 2 MG tablet Take 2 mg by mouth 4 (four) times daily as needed for diarrhea or loose stools.     Multiple Vitamin (MULTIVITAMIN) tablet Take 1 tablet by mouth every evening.     omeprazole (PRILOSEC) 20 MG capsule Take 1 capsule by mouth 2 (two) times daily.     valsartan-hydrochlorothiazide (DIOVAN-HCT) 160-25 MG tablet Take 1 tablet by mouth every evening.     vitamin B-12 (CYANOCOBALAMIN) 100 MCG tablet Take 100 mcg by mouth every evening.     famotidine (PEPCID) 20 MG tablet Take 20 mg by mouth daily as needed for heartburn or indigestion. (Patient not taking: Reported on 12/19/2021)     No current facility-administered medications for this visit.    Allergies:   Nexium [esomeprazole], Codeine, Lisinopril, Losartan potassium-hctz, Paroxetine, Sertraline hcl, and Zofran [ondansetron]   Social History:  The patient  reports that she has never smoked. She has never used smokeless tobacco. She reports that she does not drink alcohol and does not use drugs.   Family History:  The patient's family history includes Anemia in her father; Asthma in her mother; Breast cancer in her paternal aunt and paternal grandmother; Cancer - Lung in her father; Colon polyps in her father and sister; Coronary artery disease in her mother; Diabetes in her father; Glaucoma in her sister; Hypertension in her father and mother; Hyperthyroidism in her sister; Osteoporosis in  her mother and sister; Other in her mother; Rheum arthritis in her sister.    ROS:  Please see the history of present illness.   Otherwise, review of systems is positive for none.   All other systems are reviewed and negative.    PHYSICAL EXAM: VS:  BP 134/70    Pulse 76    Ht 5\' 2"  (1.575 m)    Wt 155 lb 12.8 oz (70.7 kg)    SpO2 95%    BMI 28.50 kg/m  , BMI Body mass index is 28.5 kg/m. GEN: Well nourished, well developed, in no acute distress  HEENT: normal  Neck: no JVD, carotid bruits, or masses Cardiac: RRR; no murmurs, rubs, or gallops,no edema  Respiratory:  clear to auscultation bilaterally, normal work of breathing GI: soft, nontender, nondistended, + BS MS: no deformity or atrophy  Skin: warm and dry Neuro:  Strength and  sensation are intact Psych: euthymic mood, full affect  EKG:  EKG is ordered today. Personal review of the ekg ordered shows sinus rhythm  Recent Labs: 09/11/2021: ALT 19; BUN 14; Creatinine, Ser 0.90; Hemoglobin 15.0; Platelets 286; Potassium 3.3; Sodium 141    Lipid Panel     Component Value Date/Time   CHOL 125 09/12/2021 0403   TRIG 160 (H) 09/12/2021 0403   HDL 48 09/12/2021 0403   CHOLHDL 2.6 09/12/2021 0403   VLDL 32 09/12/2021 0403   LDLCALC 45 09/12/2021 0403     Wt Readings from Last 3 Encounters:  12/19/21 155 lb 12.8 oz (70.7 kg)  10/31/21 158 lb (71.7 kg)  10/22/21 156 lb (70.8 kg)      Other studies Reviewed: Additional studies/ records that were reviewed today include: TTE 09/12/21  Review of the above records today demonstrates:   1. Left ventricular ejection fraction, by estimation, is 60 to 65%. The  left ventricle has normal function. Left ventricular endocardial border  not optimally defined to evaluate regional wall motion. Left ventricular  diastolic parameters are  indeterminate.   2. Right ventricular systolic function is normal. The right ventricular  size is normal. Tricuspid regurgitation signal is  inadequate for assessing  PA pressure.   3. The mitral valve is normal in structure. No evidence of mitral valve  regurgitation. No evidence of mitral stenosis.   4. The aortic valve is grossly normal. Aortic valve regurgitation is not  visualized. No aortic stenosis is present.   5. The inferior vena cava is normal in size with greater than 50%  respiratory variability, suggesting right atrial pressure of 3 mmHg.   Monitor 10/28/21 personally reviewed NSR One run atrial tachycardia vs flutter No afib PAC;s    ASSESSMENT AND PLAN:  1.  TIA: Post TNKase with negative MRI and EEG.  Currently on Plavix 75 mg.  She wore a cardiac monitor that showed no evidence of atrial fibrillation.  She would benefit from further monitoring.  Due to that, we Porche Steinberger plan for Linq monitor implant.  Risks and benefits were discussed which include bleeding and infection.  She understands these risks and has agreed to the procedure.  2.  Hypertension: Currently well controlled  3.  Hyperlipidemia: Continue statin per neurology    Current medicines are reviewed at length with the patient today.   The patient does not have concerns regarding her medicines.  The following changes were made today: None  Labs/ tests ordered today include:  No orders of the defined types were placed in this encounter.    Disposition:   FU with Lev Cervone 2 months  Signed, Kyndell Zeiser Meredith Leeds, MD  12/19/2021 12:53 PM     [place] [address] [place] [address] [phone] (office) [phone] (fax)

## 2021-12-19 NOTE — Patient Instructions (Addendum)
Medication Instructions:  Your physician recommends that you continue on your current medications as directed. Please refer to the Current Medication list given to you today.  *If you need a refill on your cardiac medications before your next appointment, please call your pharmacy*   Lab Work: None ordered   Testing/Procedures: Your physician has recommended that you have a loop recorder inserted. The office will call you to scheduled this after we have checked with your insurance  Follow-Up: At [facility], you and your health needs are our priority.  As part of our continuing mission to provide you with exceptional heart care, we have created designated Provider Care Teams.  These Care Teams include your primary Cardiologist (physician) and Advanced Practice Providers (APPs -  Physician Assistants and Nurse Practitioners) who all work together to provide you with the care you need, when you need it.  We recommend signing up for the patient portal called "MyChart".  Sign up information is provided on this After Visit Summary.  MyChart is used to connect with patients for Virtual Visits (Telemedicine).  Patients are able to view lab/test results, encounter notes, upcoming appointments, etc.  Non-urgent messages can be sent to your provider as well.   To learn more about what you can do with MyChart, go to [email or web address].    Your next appointment:   To be   determined  The format for your next appointment:   In Person  Provider:   Allegra Lai, MD    Thank you for choosing [facility]!!   Trinidad Curet, RN [phone]   Other Instructions    Implantable Loop Recorder Placement An implantable loop recorder is a small electronic device that is placed under the skin of your chest. The device records the electrical activity of your heart over a long period of time. Your health care provider can download these recordings to monitor your heart. You may need an  implantable loop recorder if you have periods of abnormal heart activity (arrhythmias) or unexplained fainting (syncope). The recorder can be left in place for 1 year or longer. Tell a health care provider about: Any allergies you have. All medicines you are taking, including vitamins, herbs, eye drops, creams, and over-the-counter medicines. Any problems you or family members have had with anesthetic medicines. Any bleeding problems you have. Any surgeries you have had. Any medical conditions you have. Whether you are pregnant or may be pregnant. What are the risks? Generally, this is a safe procedure. However, problems may occur, including: Infection. Bleeding. Allergic reactions to anesthetic medicines. Damage to nerves or blood vessels. Failure of the device to work. This could require another surgery to replace it. What happens before the procedure?  You may have a physical exam, blood tests, and imaging tests of your heart, such as a chest X-ray. Follow instructions from your health care provider about eating or drinking restrictions. Ask your health care provider about: Changing or stopping your regular medicines. This is especially important if you are taking diabetes medicines or blood thinners. Taking medicines such as aspirin and ibuprofen. These medicines can thin your blood. Do not take these medicines unless your health care provider tells you to take them. Taking over-the-counter medicines, vitamins, herbs, and supplements. Ask your health care provider how your surgical site will be marked or identified. Ask your health care provider what steps will be taken to help prevent infection. These may include: Removing hair at the surgery site. Washing skin with a germ-killing soap. Plan  to have someone take you home from the hospital or clinic. Plan to have a responsible adult care for you for at least 24 hours after you leave the hospital or clinic. This is important. Do not  use any products that contain nicotine or tobacco, such as cigarettes and e-cigarettes. If you need help quitting, ask your health care provider. What happens during the procedure? An IV will be inserted into one of your veins. You may be given one or more of the following: A medicine to help you relax (sedative). A medicine to numb the area (local anesthetic). A small incision will be made on the left side of your upper chest. A pocket will be created under your skin. The device will be placed in the pocket. The incision will be closed with stitches (sutures) or adhesive strips. A bandage (dressing) will be placed over the incision. The procedure may vary among health care providers and hospitals. What happens after the procedure? Your blood pressure, heart rate, breathing rate, and blood oxygen level will be monitored until you leave the hospital or clinic. You may be able to go home on the day of your surgery. Before you go home: Your health care provider will program your recorder. You will learn how to trigger your device with a handheld activator. You will learn how to send recordings to your health care provider. You will get an ID card for your device, and you will be told when to use it. Do not drive for 24 hours if you were given a sedative during your procedure. Summary An implantable loop recorder is a small electronic device that is placed under the skin of your chest to monitor your heart over a long period of time. The recorder can be left in place for 1 year or longer. Plan to have someone take you home from the hospital or clinic. This information is not intended to replace advice given to you by your health care provider. Make sure you discuss any questions you have with your health care provider. Document Revised: 03/18/2021 Document Reviewed: 03/18/2021 Elsevier Patient Education  [place] Placement, Care After This sheet  gives you information about how to care for yourself after your procedure. Your health care provider may also give you more specific instructions. If you have problems or questions, contact your health care provider. What can I expect after the procedure? After the procedure, it is common to have: Soreness or discomfort near the incision. Some swelling or bruising near the incision.  Follow these instructions at home: Incision care   Leave your outer dressing on for 72 hours.  After 72 hours you can remove your outer dressing and shower. Leave adhesive strips in place. These skin closures may need to stay in place for 1-2 weeks. If adhesive strip edges start to loosen and curl up, you may trim the loose edges.  You may remove the strips if they have not fallen off after 2 weeks. Check your incision area every day for signs of infection. Check for: Redness, swelling, or pain. Fluid or blood. Warmth. Pus or a bad smell. Do not take baths, swim, or use a hot tub until your incision is completely healed. If your wound site starts to bleed apply pressure.      If you have any questions/concerns please call the device clinic at [phone].  Activity  Return to your normal activities.  General instructions Follow instructions from your health care  provider about how to manage your implantable loop recorder and transmit the information. Learn how to activate a recording if this is necessary for your type of device. You may go through a metal detection gate, and you may let someone hold a metal detector over your chest. Show your ID card if needed. Do not have an MRI unless you check with your health care provider first. Take over-the-counter and prescription medicines only as told by your health care provider. Keep all follow-up visits as told by your health care provider. This is important. Contact a health care provider if: You have redness, swelling, or pain around your incision. You  have a fever. You have pain that is not relieved by your pain medicine. You have triggered your device because of fainting (syncope) or because of a heartbeat that feels like it is racing, slow, fluttering, or skipping (palpitations). Get help right away if you have: Chest pain. Difficulty breathing. Summary After the procedure, it is common to have soreness or discomfort near the incision. Change your dressing as told by your health care provider. Follow instructions from your health care provider about how to manage your implantable loop recorder and transmit the information. Keep all follow-up visits as told by your health care provider. This is important. This information is not intended to replace advice given to you by your health care provider. Make sure you discuss any questions you have with your health care provider. Document Released: 10/28/2015 Document Revised: 01/01/2018 Document Reviewed: 01/01/2018 Elsevier Patient Education  2020 [employer].

## 2021-12-22 NOTE — Addendum Note (Signed)
Addended by: Jordan Likes on: 12/22/2021 03:44 PM   Modules accepted: Orders

## 2021-12-23 ENCOUNTER — Other Ambulatory Visit: Payer: Self-pay

## 2021-12-23 NOTE — Patient Outreach (Signed)
[facility] [facility]) Care Management  12/23/2021  Jordan Lane [birth year] [id number]   Telephone outreach to patient to obtain mRS was successfully completed. MRS= 0   [place] Care Management Assistant

## 2021-12-26 DIAGNOSIS — K580 Irritable bowel syndrome with diarrhea: Secondary | ICD-10-CM | POA: Diagnosis not present

## 2021-12-26 DIAGNOSIS — Z0000 Encounter for general adult medical examination without abnormal findings: Secondary | ICD-10-CM | POA: Diagnosis not present

## 2021-12-26 DIAGNOSIS — E781 Pure hyperglyceridemia: Secondary | ICD-10-CM | POA: Diagnosis not present

## 2021-12-26 DIAGNOSIS — Z1389 Encounter for screening for other disorder: Secondary | ICD-10-CM | POA: Diagnosis not present

## 2021-12-26 DIAGNOSIS — Z8673 Personal history of transient ischemic attack (TIA), and cerebral infarction without residual deficits: Secondary | ICD-10-CM | POA: Diagnosis not present

## 2021-12-26 DIAGNOSIS — K219 Gastro-esophageal reflux disease without esophagitis: Secondary | ICD-10-CM | POA: Diagnosis not present

## 2021-12-26 DIAGNOSIS — M8580 Other specified disorders of bone density and structure, unspecified site: Secondary | ICD-10-CM | POA: Diagnosis not present

## 2021-12-26 DIAGNOSIS — F419 Anxiety disorder, unspecified: Secondary | ICD-10-CM | POA: Diagnosis not present

## 2021-12-26 DIAGNOSIS — I10 Essential (primary) hypertension: Secondary | ICD-10-CM | POA: Diagnosis not present

## 2021-12-26 DIAGNOSIS — I6930 Unspecified sequelae of cerebral infarction: Secondary | ICD-10-CM | POA: Diagnosis not present

## 2022-01-12 DIAGNOSIS — Z1231 Encounter for screening mammogram for malignant neoplasm of breast: Secondary | ICD-10-CM | POA: Diagnosis not present

## 2022-01-26 ENCOUNTER — Other Ambulatory Visit: Payer: Self-pay

## 2022-01-26 NOTE — Patient Instructions (Signed)
Patient Goals/Self-Care Activities: Hypertension check blood pressure weekly write blood pressure results in a log or diary take blood pressure log to all doctor appointments call doctor for signs and symptoms of high blood pressure

## 2022-01-26 NOTE — Patient Outreach (Signed)
[place] [facility]) Care Management  01/26/2022  Jordan Lane 11/23/1946 [id number]  CMA made telephone outreach call to complete [facility] Patient Satisfaction Survey. Call successfully completed and documented for future purposes.  [name] [facility] Management Assistant [phone]

## 2022-01-26 NOTE — Patient Outreach (Signed)
[place] [facility]) Care Management  01/26/2022  Jordan Lane 04-17-1946 [id number]   Telephone call to patient for follow up. Patient reports doing well.  Hypertension management continues. Patient active with [employer].  No concerns.    Care Plan : Hypertension (Adult)  Updates made by Jon Billings, RN since 01/26/2022 12:00 AM  Completed 01/26/2022   Problem: Hypertension (Hypertension) Resolved 01/26/2022     Care Plan : RN Care Manager Plan of Care  Updates made by Jon Billings, RN since 01/26/2022 12:00 AM     Problem: [place] Disease Management and Care Coordination Needs HTN   Priority: High     Long-Range Goal: Development of Plan of Care for Management of HTN   Start Date: 10/31/2021  Expected End Date: 11/21/2022  This Visit's Progress: On track  Priority: High  Note:   Current Barriers:  Chronic Disease Management support and education needs related to HTN   RNCM Clinical Goal(s):  Patient will verbalize basic understanding of  HTN disease process and self health management plan as evidenced by Blood pressure less than 140/80  through collaboration with RN Care manager, provider, and care team.   Interventions: Education and support for HTN Inter-disciplinary care team collaboration (see longitudinal plan of care) Evaluation of current treatment plan related to  self management and patient's adherence to plan as established by provider   Hypertension Interventions:  (Status:  Goal on track:  Yes.) Long Term Goal Last practice recorded BP readings:  BP Readings from Last 3 Encounters:  10/22/21 134/82  09/13/21 (!) 130/51  12/17/20 105/62  Most recent eGFR/CrCl: No results found for: EGFR  No components found for: CRCL  Evaluation of current treatment plan related to hypertension self management and patient's adherence to plan as established by provider Discussed plans with patient for ongoing care management follow up and provided  patient with direct contact information for care management team 10/31/21 Patient following low salt diet.  Blood pressure 125/70.    01/26/22 Patient continues to do well. She reports blood pressure last check 128/70.  Patient active with the [employer].   Patient Goals/Self-Care Activities: Hypertension check blood pressure weekly write blood pressure results in a log or diary take blood pressure log to all doctor appointments call doctor for signs and symptoms of high blood pressure  Follow Up Plan:  Telephone follow up appointment with care management team member scheduled for:  May The patient has been provided with contact information for the care management team and has been advised to call with any health related questions or concerns.      Plan: Follow-up: Patient agrees to Care Plan and Follow-up. Follow-up in 3 .  Jone Baseman, RN, MSN [facility] Care Management Care Management Coordinator Direct Line [phone] Toll Free: [phone]  Fax: [phone]

## 2022-01-27 ENCOUNTER — Ambulatory Visit: Payer: Self-pay

## 2022-01-30 ENCOUNTER — Ambulatory Visit: Payer: Medicare HMO

## 2022-02-22 IMAGING — MR MR HEAD W/O CM
12 series · 48 of 48 positions shown · non-contrast
Comparison: [id number]

CLINICAL DATA: Neuro deficit, acute, stroke suspected

EXAM:
MRI HEAD WITHOUT CONTRAST
TECHNIQUE: Multiplanar, multiecho pulse sequences of the brain and surrounding
structures were obtained without intravenous contrast.

[Series 5: DWI · axial · 3.0mm · 0.88mm/px · z∈[-108,+32]mm · 10 of 96 slices shown (1 of 4)]
[im 1/96]
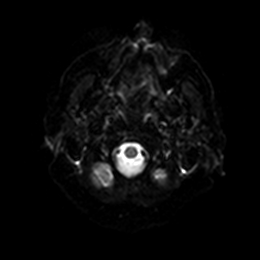
[im 11/96]
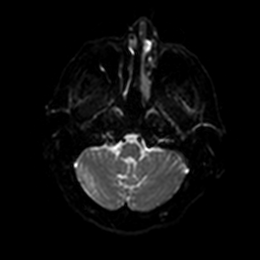
[im 22/96]
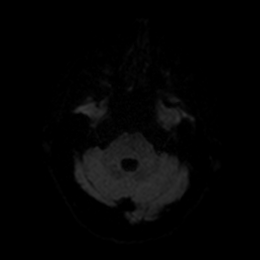
[im 32/96]
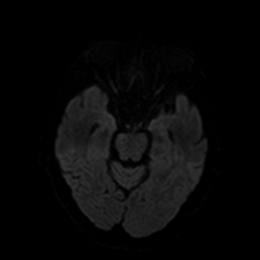
[im 43/96]
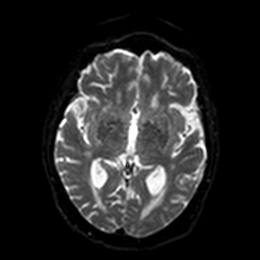
[im 53/96]
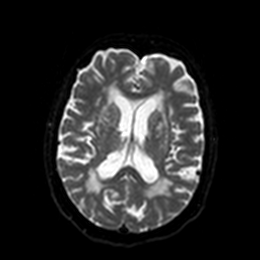
[im 64/96]
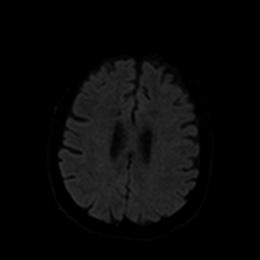
[im 74/96]
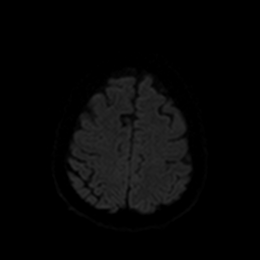
[im 85/96]
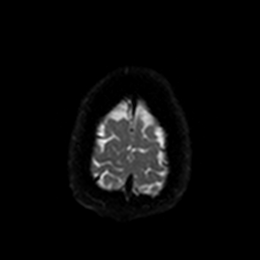
[im 96/96]
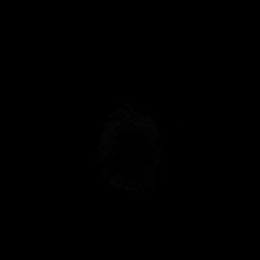

[Series 6: DWI · axial · 3.0mm · 0.88mm/px · z∈[-108,+32]mm · 4 of 48 slices shown (2 of 4)]
[im 1/48]
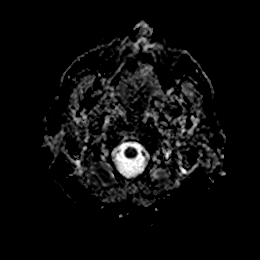
[im 16/48]
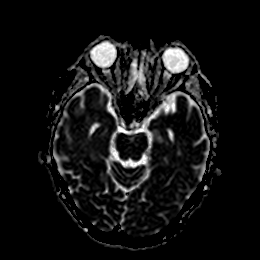
[im 32/48]
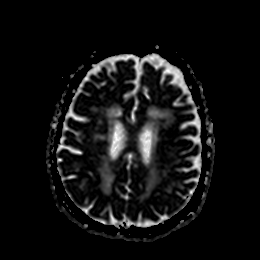
[im 48/48]
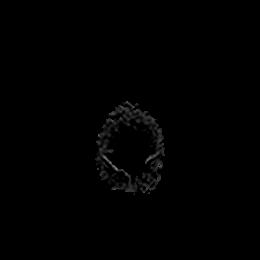

[Series 7: DWI · coronal · 4.0mm · 0.88mm/px · 6 of 64 slices shown (3 of 4)]
[im 1/64]
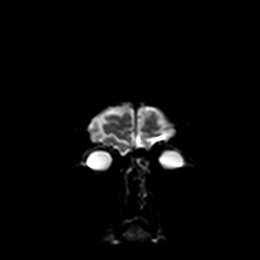
[im 13/64]
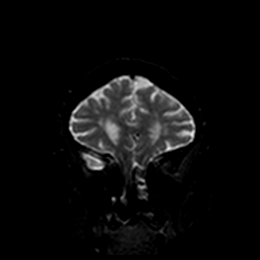
[im 26/64]
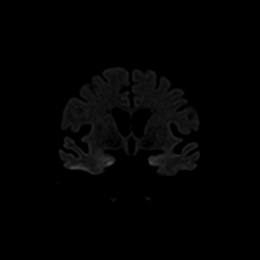
[im 38/64]
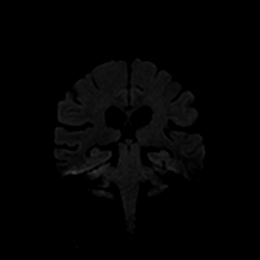
[im 51/64]
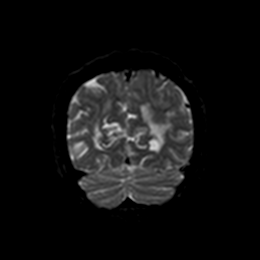
[im 64/64]
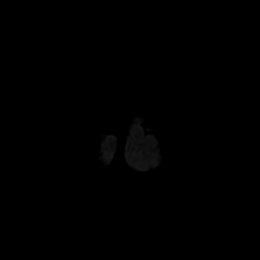

[Series 8: DWI · coronal · 4.0mm · 0.88mm/px · 3 of 32 slices shown (4 of 4)]
[im 1/32]
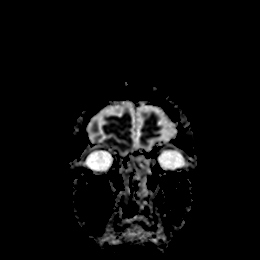
[im 16/32]
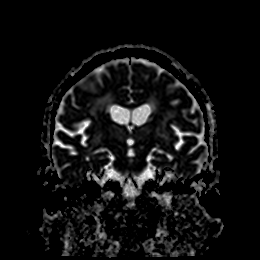
[im 32/32]
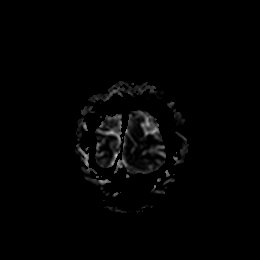

[Series 9: T1 · sagittal · 5.0mm · 0.75mm/px · 2 of 23 slices shown]
[im 1/23]
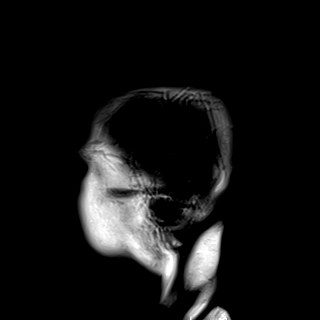
[im 23/23]
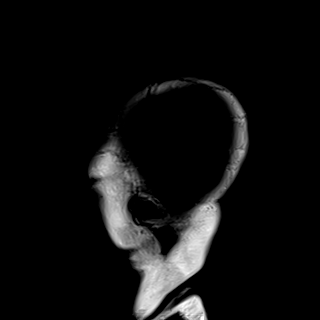

[Series 10: T2 · axial · 5.0mm · 0.72mm/px · z∈[-110,+39]mm · 2 of 26 slices shown (1 of 2)]
[im 1/26]
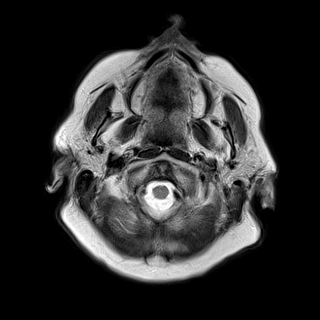
[im 26/26]
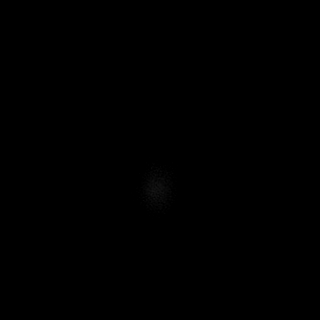

[Series 15: FLAIR · axial · 5.0mm · 0.90mm/px · z∈[-135,+9]mm · 2 of 25 slices shown (1 of 2)]
[im 1/25]
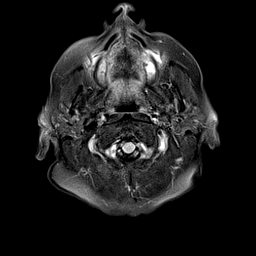
[im 25/25]
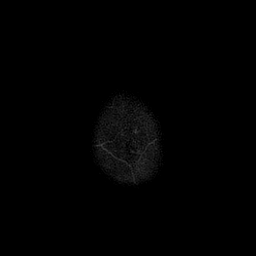

[Series 16: mag_images · axial · 3.0mm · 0.90mm/px · z∈[-137,+16]mm · 5 of 52 slices shown]
[im 1/52]
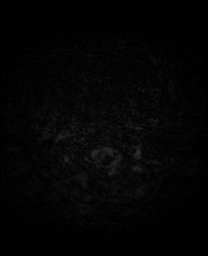
[im 13/52]
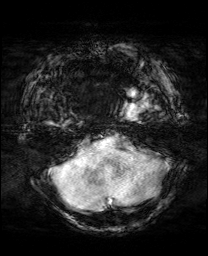
[im 26/52]
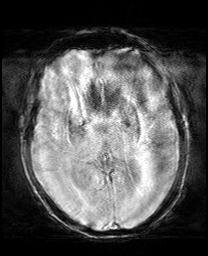
[im 39/52]
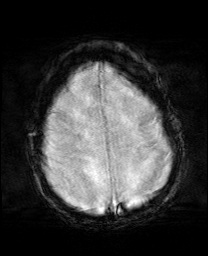
[im 52/52]
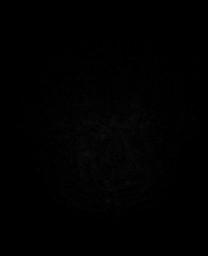

[Series 17: pha_images · axial · 3.0mm · 0.90mm/px · z∈[-131,+7]mm · 4 of 47 slices shown]
[im 1/47]
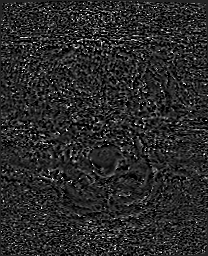
[im 16/47]
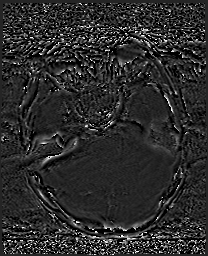
[im 31/47]
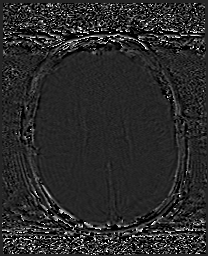
[im 47/47]
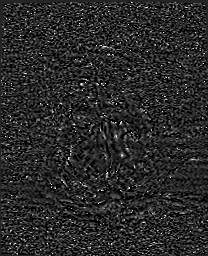

[Series 18: swi_images · axial · 3.0mm · 0.90mm/px · z∈[-137,+16]mm · 5 of 52 slices shown]
[im 1/52]
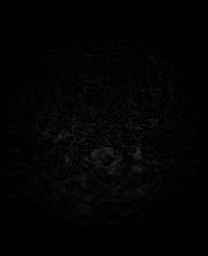
[im 13/52]
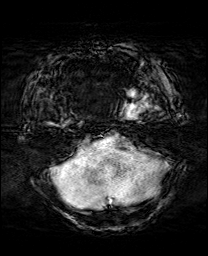
[im 26/52]
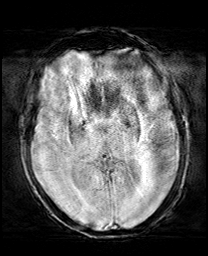
[im 39/52]
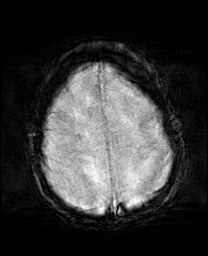
[im 52/52]
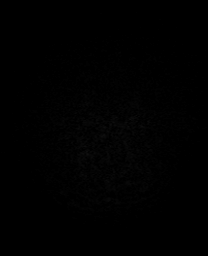

[Series 21: T2 · coronal · 5.0mm · 0.72mm/px · 3 of 29 slices shown (2 of 2)]
[im 1/29]
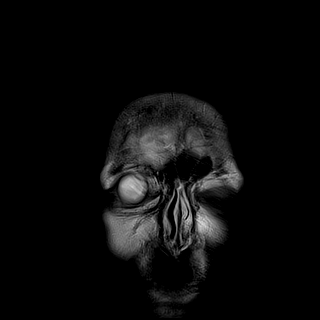
[im 15/29]
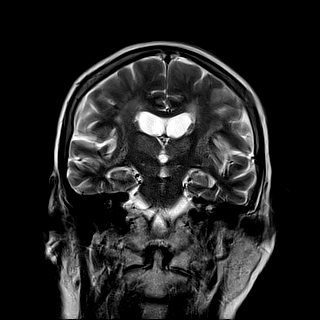
[im 29/29]
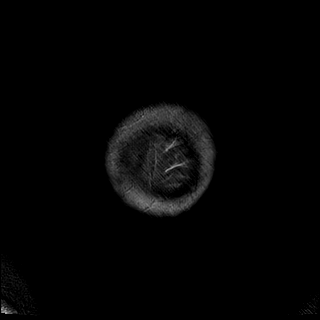

[Series 22: FLAIR · axial · 5.0mm · 1.03mm/px · z∈[-133,+11]mm · 2 of 25 slices shown (2 of 2)]
[im 1/25]
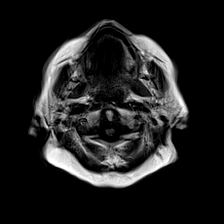
[im 25/25]
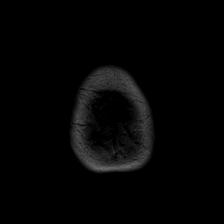

[48 of 48 positions shown; findings below may reference images not displayed]

FINDINGS: Motion artifact is present.

Brain: There is no acute infarction or intracranial hemorrhage.
There is no intracranial mass, mass effect, or edema. There is no
hydrocephalus or extra-axial fluid collection.

Prominence of the ventricles and sulci reflects parenchymal volume
loss. Patchy and confluent areas T2 hyperintensity in the
supratentorial and pontine white matter are nonspecific but probably
reflect similar moderate chronic microvascular ischemic changes.
There are chronic small vessel infarcts of the central white matter
bilaterally. Chronic small vessel infarcts and prominent
perivascular spaces of the basal ganglia and thalami.

Vascular: Major vessel flow voids at the skull base are preserved.

Skull and upper cervical spine: Normal marrow signal is preserved.

Sinuses/Orbits: Minor mucosal thickening. Bilateral lens
replacements.

Other: Sella is unremarkable.  Mastoid air cells are clear.
IMPRESSION: No evidence of recent infarction, hemorrhage, or mass.

Moderate chronic microvascular ischemic changes and few chronic
small vessel infarcts similar to prior study.

## 2022-03-05 ENCOUNTER — Ambulatory Visit: Payer: Medicare HMO | Admitting: Cardiology

## 2022-03-05 ENCOUNTER — Encounter: Payer: Self-pay | Admitting: Cardiology

## 2022-03-05 VITALS — BP 160/92 | HR 64 | Ht 62.0 in | Wt 159.0 lb

## 2022-03-05 DIAGNOSIS — I639 Cerebral infarction, unspecified: Secondary | ICD-10-CM

## 2022-03-05 NOTE — Patient Instructions (Signed)
Medication Instructions:  ?Your physician recommends that you continue on your current medications as directed. Please refer to the Current Medication list given to you today. ? ?Labwork: ?None ordered. ? ?Testing/Procedures: ?None ordered. ? ?Follow-Up: ?As needed with Dr. Curt Bears ? ? ?Thank you for choosing [facility]!! ? ? ?Trinidad Curet, RN ?[phone] ? ? ? ?Implantable Loop Recorder Placement, Care After ?This sheet gives you information about how to care for yourself after your procedure. Your health care provider may also give you more specific instructions. If you have problems or questions, contact your health care provider. ?What can I expect after the procedure? ?After the procedure, it is common to have: ?Soreness or discomfort near the incision. ?Some swelling or bruising near the incision. ? ?Follow these instructions at home: ?Incision care ? ? Leave your outer dressing on for 72 hours.  After 72 hours you can remove your outer dressing and shower. ?Leave adhesive strips in place. These skin closures may need to stay in place for 1-2 weeks. If adhesive strip edges start to loosen and curl up, you may trim the loose edges.  You may remove the strips if they have not fallen off after 2 weeks. ?Check your incision area every day for signs of infection. Check for: ?Redness, swelling, or pain. ?Fluid or blood. ?Warmth. ?Pus or a bad smell. ?Do not take baths, swim, or use a hot tub until your incision is completely healed. ?If your wound site starts to bleed apply pressure.   ?   ?If you have any questions/concerns please call the device clinic at [phone]. ? ?Activity ? ?Return to your normal activities. ? ?General instructions ?Follow instructions from your health care provider about how to manage your implantable loop recorder and transmit the information. Learn how to activate a recording if this is necessary for your type of device. ?You may go through a metal detection gate, and you may  let someone hold a metal detector over your chest. Show your ID card if needed. ?Do not have an MRI unless you check with your health care provider first. ?Take over-the-counter and prescription medicines only as told by your health care provider. ?Keep all follow-up visits as told by your health care provider. This is important. ?Contact a health care provider if: ?You have redness, swelling, or pain around your incision. ?You have a fever. ?You have pain that is not relieved by your pain medicine. ?You have triggered your device because of fainting (syncope) or because of a heartbeat that feels like it is racing, slow, fluttering, or skipping (palpitations). ?Get help right away if you have: ?Chest pain. ?Difficulty breathing. ?Summary ?After the procedure, it is common to have soreness or discomfort near the incision. ?Change your dressing as told by your health care provider. ?Follow instructions from your health care provider about how to manage your implantable loop recorder and transmit the information. ?Keep all follow-up visits as told by your health care provider. This is important. ?This information is not intended to replace advice given to you by your health care provider. Make sure you discuss any questions you have with your health care provider. ?Document Released: 10/28/2015 Document Revised: 01/01/2018 Document Reviewed: 01/01/2018 ?Elsevier Patient Education ? [place]. ? ?  ?

## 2022-03-05 NOTE — Progress Notes (Signed)
? ?Electrophysiology Office Note ? ? ?Date:  03/05/2022  ? ?ID:  Jordan Lane, DOB [birth year], MRN [id number] ? ?PCP:  Lavone Orn, MD  ?Cardiologist:  Johnsie Cancel ?Primary Electrophysiologist:  Shirin Echeverry Meredith Leeds, MD   ? ?Chief Complaint: CVA ?  ?History of Present Illness: ?Jordan Lane is a 76 y.o. female who is being seen today for the evaluation of CVA at the request of Lavone Orn, MD. Presenting today for electrophysiology evaluation. ? ?She has a history significant for hypertension.  She presented to the hospital 09/11/2021 with sudden aphasia and right-sided anopia.  She had TNKase administered with resolution of her findings.  She was recommended to have a 30-day monitor which did not show any evidence of atrial fibrillation.  In 2016 she had an episode where she could only walk to the left.  She went in the hospital and was noted to have a stroke at that point. ? ?Today, denies symptoms of palpitations, chest pain, shortness of breath, orthopnea, PND, lower extremity edema, claudication, dizziness, presyncope, syncope, bleeding, or neurologic sequela. The patient is tolerating medications without difficulties.  He is feeling well.  She has no chest pain or shortness of breath.  She is mildly anxious about her Linq monitor implant. ? ? ?Past Medical History:  ?Diagnosis Date  ? Arthritis   ? Chronic insomnia   ? Diverticular disease of left colon   ? colonic AVMs  ? DJD (degenerative joint disease)   ? knees, toe, right hip  ? Fibroadenoma of breast 2011  ? Right  ? Fractured shoulder 2018  ? right  ? GERD (gastroesophageal reflux disease)   ? H/O colonoscopy   ? tubular adenoma  ? History of blood transfusion 52 yrs ago  ? Hypertension   ? IBS (irritable bowel syndrome)   ? Impaired fasting glucose   ? Left wrist fracture 06/2009  ? Lower GI bleed 2010  ? Osteopenia 2007  ? DEXA scan 1.7 hip  ? Plantar fasciitis of right foot   ? Positive colorectal cancer screening using Cologuard test 11/2018   ? Positive PPD 1960  ? not treated  ? Postcholecystectomy diarrhea   ? Reflux   ? Seasonal allergic rhinitis   ? Shingles 2017  ? R V1  ? Stroke [name]) 10/2015  ? ?Past Surgical History:  ?Procedure Laterality Date  ? ABDOMINAL HYSTERECTOMY  1984  ? TAH, partial  ? BREAST BIOPSY Right 2011  ? CATARACT EXTRACTION, BILATERAL Bilateral 2021  ? CHOLECYSTECTOMY  2009  ? ESOPHAGOGASTRODUODENOSCOPY  11/14/2012  ? Procedure: ESOPHAGOGASTRODUODENOSCOPY (EGD);  Surgeon: Wonda Horner, MD;  Location: [facility] ENDOSCOPY;  Service: Endoscopy;  Laterality: N/A;  ? ESOPHAGOGASTRODUODENOSCOPY (EGD) WITH PROPOFOL N/A 09/21/2016  ? Procedure: ESOPHAGOGASTRODUODENOSCOPY (EGD) WITH PROPOFOL;  Surgeon: Garlan Fair, MD;  Location: [facility] ENDOSCOPY;  Service: Endoscopy;  Laterality: N/A;  ? ESOPHAGOGASTRODUODENOSCOPY (EGD) WITH PROPOFOL N/A 12/17/2020  ? Procedure: ESOPHAGOGASTRODUODENOSCOPY (EGD) WITH PROPOFOL;  Surgeon: Ronald Lobo, MD;  Location: [facility] ENDOSCOPY;  Service: Endoscopy;  Laterality: N/A;  ? Herniated disc  1987  ? lower back   ? KNEE SURGERY  left 2010, right 2013  ? Arthroscopic  ? LUMBAR LAMINECTOMY Left   ? PARTIAL KNEE ARTHROPLASTY  06/14/2012  ? Procedure: UNICOMPARTMENTAL KNEE;  Surgeon: Mauri Pole, MD;  Location: [facility] ORS;  Service: Orthopedics;  Laterality: Left;  Left Medial Unicompartmental Knee  ? PARTIAL KNEE ARTHROPLASTY Right 11/27/2013  ? Procedure: UNICOMPARTMENTAL RIGHT KNEE, Steroid injection in right great toe;  Surgeon: Mauri Pole, MD;  Location: [facility] ORS;  Service: Orthopedics;  Laterality: Right;  ? ? ? ?Current Outpatient Medications  ?Medication Sig Dispense Refill  ? ALPRAZolam (XANAX) 0.25 MG tablet Take 0.25 mg by mouth at bedtime as needed for anxiety or sleep.    ? atorvastatin (LIPITOR) 20 MG tablet Take 1 tablet (20 mg total) by mouth daily at 6 PM. (Patient taking differently: Take 20 mg by mouth every evening.) 30 tablet 2  ? cholecalciferol (VITAMIN D) 1000 UNITS tablet Take 1,000 Units  by mouth every evening.    ? clopidogrel (PLAVIX) 75 MG tablet Take 1 tablet (75 mg total) by mouth daily. (Patient taking differently: Take 75 mg by mouth every evening.) 30 tablet 2  ? diphenhydrAMINE (BENADRYL) 25 mg capsule Take 25 mg by mouth every 6 (six) hours as needed for allergies.    ? escitalopram (LEXAPRO) 10 MG tablet Take 10 mg by mouth at bedtime.    ? famotidine (PEPCID) 20 MG tablet Take 20 mg by mouth daily as needed for heartburn or indigestion.    ? fluticasone (FLONASE) 50 MCG/ACT nasal spray Place 1 spray into both nostrils daily as needed for allergies or rhinitis.    ? KLOR-CON M20 20 MEQ tablet Take 20 mEq by mouth every evening.    ? Lidocaine HCl (ASPERCREME LIDOCAINE) 4 % LIQD Apply 1 application topically as directed.    ? loperamide (IMODIUM A-D) 2 MG tablet Take 2 mg by mouth 4 (four) times daily as needed for diarrhea or loose stools.    ? Multiple Vitamin (MULTIVITAMIN) tablet Take 1 tablet by mouth every evening.    ? omeprazole (PRILOSEC) 20 MG capsule Take 1 capsule by mouth daily.    ? valsartan-hydrochlorothiazide (DIOVAN-HCT) 160-25 MG tablet Take 1 tablet by mouth every evening.    ? vitamin B-12 (CYANOCOBALAMIN) 100 MCG tablet Take 100 mcg by mouth every evening.    ? ?No current facility-administered medications for this visit.  ? ? ?Allergies:   Nexium [esomeprazole], Codeine, Lisinopril, Lorazepam, Losartan potassium-hctz, Omeprazole, Paroxetine, Sertraline hcl, and Zofran [ondansetron]  ? ?Social History:  The patient  reports that she has never smoked. She has never used smokeless tobacco. She reports that she does not drink alcohol and does not use drugs.  ? ?Family History:  The patient's family history includes Anemia in her father; Asthma in her mother; Breast cancer in her paternal aunt and paternal grandmother; Cancer - Lung in her father; Colon polyps in her father and sister; Coronary artery disease in her mother; Diabetes in her father; Glaucoma in her sister;  Hypertension in her father and mother; Hyperthyroidism in her sister; Osteoporosis in her mother and sister; Other in her mother; Rheum arthritis in her sister.  ? ? ?ROS:  Please see the history of present illness.   Otherwise, review of systems is positive for none.   All other systems are reviewed and negative.  ? ?PHYSICAL EXAM: ?VS:  BP (!) 160/92   Pulse 64   Ht '5\' 2"'$  (1.575 m)   Wt 159 lb (72.1 kg)   SpO2 98%   BMI 29.08 kg/m?  , BMI Body mass index is 29.08 kg/m?. ?GEN: Well nourished, well developed, in no acute distress  ?HEENT: normal  ?Neck: no JVD, carotid bruits, or masses ?Cardiac: RRR; no murmurs, rubs, or gallops,no edema  ?Respiratory:  clear to auscultation bilaterally, normal work of breathing ?GI: soft, nontender, nondistended, + BS ?MS: no deformity or atrophy  ?  Skin: warm and dry ?Neuro:  Strength and sensation are intact ?Psych: euthymic mood, full affect ? ?EKG:  EKG is ordered today. ?Personal review of the ekg ordered shows sinus rhythm  ? ?Recent Labs: ?09/11/2021: ALT 19; BUN 14; Creatinine, Ser 0.90; Hemoglobin 15.0; Platelets 286; Potassium 3.3; Sodium 141  ? ? ?Lipid Panel  ?   ?Component Value Date/Time  ? CHOL 125 09/12/2021 0403  ? TRIG 160 (H) 09/12/2021 0403  ? HDL 48 09/12/2021 0403  ? CHOLHDL 2.6 09/12/2021 0403  ? VLDL 32 09/12/2021 0403  ? [place] 45 09/12/2021 0403  ? ? ? ?Wt Readings from Last 3 Encounters:  ?03/05/22 159 lb (72.1 kg)  ?12/19/21 155 lb 12.8 oz (70.7 kg)  ?10/31/21 158 lb (71.7 kg)  ?  ? ? ?Other studies Reviewed: ?Additional studies/ records that were reviewed today include: TTE 09/12/21  ?Review of the above records today demonstrates:  ? 1. Left ventricular ejection fraction, by estimation, is 60 to 65%. The  ?left ventricle has normal function. Left ventricular endocardial border  ?not optimally defined to evaluate regional wall motion. Left ventricular  ?diastolic parameters are  ?indeterminate.  ? 2. Right ventricular systolic function is normal.  The right ventricular  ?size is normal. Tricuspid regurgitation signal is inadequate for assessing  ?PA pressure.  ? 3. The mitral valve is normal in structure. No evidence of mitral valve  ?regurgitation. N

## 2022-03-25 DIAGNOSIS — E538 Deficiency of other specified B group vitamins: Secondary | ICD-10-CM | POA: Diagnosis not present

## 2022-03-25 DIAGNOSIS — R4189 Other symptoms and signs involving cognitive functions and awareness: Secondary | ICD-10-CM | POA: Diagnosis not present

## 2022-03-26 ENCOUNTER — Other Ambulatory Visit: Payer: Self-pay | Admitting: Internal Medicine

## 2022-03-26 DIAGNOSIS — R4189 Other symptoms and signs involving cognitive functions and awareness: Secondary | ICD-10-CM

## 2022-03-31 ENCOUNTER — Ambulatory Visit: Payer: Medicare HMO | Admitting: Adult Health

## 2022-03-31 ENCOUNTER — Encounter: Payer: Self-pay | Admitting: Adult Health

## 2022-03-31 VITALS — BP 147/88 | HR 65 | Ht 62.0 in | Wt 153.0 lb

## 2022-03-31 DIAGNOSIS — I639 Cerebral infarction, unspecified: Secondary | ICD-10-CM

## 2022-03-31 DIAGNOSIS — R410 Disorientation, unspecified: Secondary | ICD-10-CM

## 2022-03-31 NOTE — Patient Instructions (Addendum)
Your Plan: ? ?Schedule EEG to rule out any seizures ? ?Complete MRI brain on 5/16 to rule out any new stroke ? ?Please call [phone] immediately with any worsening confusion or new stroke/TIA symptoms for more emergent evaluation ? ? ? ?Follow up in 3 months or call earlier if needed ? ? ? ? ? ? ?Thank you for coming to see [place] at [facility]. I hope we have been able to provide you high quality care today. ? ?You may receive a patient satisfaction survey over the next few weeks. We would appreciate your feedback and comments so that we may continue to improve ourselves and the health of our patients. ? ?

## 2022-03-31 NOTE — Progress Notes (Signed)
?[facility] ?[address] [address] ?[phone] ? ?     STROKE FOLLOW UP NOTE ? ?[name] ?Date of Birth:  October 13, 1946 ?Medical Record Number:  [id number]  ? ?Reason for Referral: stroke follow up ? ? ? ?SUBJECTIVE: ? ? ?CHIEF COMPLAINT:  ?Chief Complaint  ?Patient presents with  ? Follow-up  ?  Rm 1 with sister Jordan Lane ?Pt is well and stable, no new concerns   ? ? ?HPI:  ? ?Update 03/31/2022 JM: Patient returns for 66-monthstroke follow-up accompanied by her sister.  Reports onset of confusion over the past 2 weeks. Pt reports this only lasted for a few minutes but sister is concerned that there is still some lingering confusion present. Does complain of increased fatigue over past 2 weeks. Was seen by PCP 4/26 - lab work largely unremarkable, memory test completed but unable to view via epic, MRI ordered which is scheduled on 5/16. Denies any other symptoms such as changes in speech/language, denies weakness, numbness, balance changes or vision changes. Does have chronic imbalance but she believes this is from chronic knee pain. Denies any changes to medications in the past 2 weeks, denies any recent illness, no witnessed seizure activity, denies any falls or trauma, denies any significant increased stressors, denies alcohol or recreational drug use.  ? ?Compliant on Plavix and atorvastatin, denies side effects.  Blood pressure today 147/88.  Routinely monitors at home and typically stable.  Cardiac monitor negative for A-fib.  Had loop recorder placed 4/6 with Dr. CCurt Lane  ? ?No further concerns at this time. ? ? ? ? ?History provided for reference purposes only ?Initial visit 10/22/2021 JM: Patient being seen for initial hospital follow-up unaccompanied.  ? ?Reports continued imbalance - some prior imbalance since prior stroke but slightly worsened after recent event. Working with PT which has been helping but has only been there 3 times so far. Ambulates without  assistive device.  Denies any recent falls.  Denies new stroke/TIA symptoms. ? ?Completed 3 weeks DAPT - remains on plavix and atorvastatin without side effects  ?Blood pressure today 134/82. Occasionally monitor at home.  ?Currently working cardiac monitor - will be completed Friday and has f/u with cardiology to review 12/2 ? ?No further concerns at this time ? ?Stroke admission 09/11/2021 ?Jordan SAHAGIANis a 76y.o. female with PMH significant for hypertension, GERD, hyperlipidemia who presented on 09/11/2021 with sudden onset aphasia and right sided visual disturbance.  Personally reviewed hospitalization pertinent progress notes, lab work and imaging.  Evaluated by Dr. SLeonie Manfor likely embolic TIA with resolution of symptoms s/p tNK administration.  MRI no acute infarct.  LTM EEG no seizure.  Recommended 30-day cardiac event monitor to assess for A. fib and if negative to consider loop recorder placement.  LDL 45.  A1c 5.3.  Recommended DAPT for 3 weeks then Plavix alone and continuation of home dose atorvastatin.  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  PT/OT recommended OP OT ? ? ? ? ? ?PERTINENT IMAGING/LABS ? ?Per recent hospitalization ?CT no acute finding ?CT head and neck no LVO ?CTP 7 cc penumbra in the anterior left frontal lobe and left parietal occipital region ?MRI no acute infarct ?LTM EEG no seizure ?LDL 45 ?HgbA1c 5.3 ? ? ? ?ROS:   ?14 system review of systems performed and negative with exception of those listed in HPI ? ?PMH:  ?Past Medical History:  ?Diagnosis Date  ? Arthritis   ?  Chronic insomnia   ? Diverticular disease of left colon   ? colonic AVMs  ? DJD (degenerative joint disease)   ? knees, toe, right hip  ? Fibroadenoma of breast 2011  ? Right  ? Fractured shoulder 2018  ? right  ? GERD (gastroesophageal reflux disease)   ? H/O colonoscopy   ? tubular adenoma  ? History of blood transfusion 52 yrs ago  ? Hypertension   ? IBS (irritable bowel syndrome)    ? Impaired fasting glucose   ? Left wrist fracture 06/2009  ? Lower GI bleed 2010  ? Osteopenia 2007  ? DEXA scan 1.7 hip  ? Plantar fasciitis of right foot   ? Positive colorectal cancer screening using Cologuard test 11/2018  ? Positive PPD 1960  ? not treated  ? Postcholecystectomy diarrhea   ? Reflux   ? Seasonal allergic rhinitis   ? Shingles 2017  ? R V1  ? Stroke [facility]) 10/2015  ? ? ?PSH:  ?Past Surgical History:  ?Procedure Laterality Date  ? ABDOMINAL HYSTERECTOMY  1984  ? TAH, partial  ? BREAST BIOPSY Right 2011  ? CATARACT EXTRACTION, BILATERAL Bilateral 2021  ? CHOLECYSTECTOMY  2009  ? ESOPHAGOGASTRODUODENOSCOPY  11/14/2012  ? Procedure: ESOPHAGOGASTRODUODENOSCOPY (EGD);  Surgeon: Wonda Horner, MD;  Location: [facility] ENDOSCOPY;  Service: Endoscopy;  Laterality: N/A;  ? ESOPHAGOGASTRODUODENOSCOPY (EGD) WITH PROPOFOL N/A 09/21/2016  ? Procedure: ESOPHAGOGASTRODUODENOSCOPY (EGD) WITH PROPOFOL;  Surgeon: Garlan Fair, MD;  Location: [facility] ENDOSCOPY;  Service: Endoscopy;  Laterality: N/A;  ? ESOPHAGOGASTRODUODENOSCOPY (EGD) WITH PROPOFOL N/A 12/17/2020  ? Procedure: ESOPHAGOGASTRODUODENOSCOPY (EGD) WITH PROPOFOL;  Surgeon: Ronald Lobo, MD;  Location: [facility] ENDOSCOPY;  Service: Endoscopy;  Laterality: N/A;  ? Herniated disc  1987  ? lower back   ? KNEE SURGERY  left 2010, right 2013  ? Arthroscopic  ? LUMBAR LAMINECTOMY Left   ? PARTIAL KNEE ARTHROPLASTY  06/14/2012  ? Procedure: UNICOMPARTMENTAL KNEE;  Surgeon: Mauri Pole, MD;  Location: [facility] ORS;  Service: Orthopedics;  Laterality: Left;  Left Medial Unicompartmental Knee  ? PARTIAL KNEE ARTHROPLASTY Right 11/27/2013  ? Procedure: UNICOMPARTMENTAL RIGHT KNEE, Steroid injection in right great toe;  Surgeon: Mauri Pole, MD;  Location: [facility] ORS;  Service: Orthopedics;  Laterality: Right;  ? ? ?Social History:  ?Social History  ? ?Socioeconomic History  ? Marital status: Married  ?  Spouse name: Not on file  ? Number of children: 3  ? Years of education: Not  on file  ? Highest education level: Not on file  ?Occupational History  ? Occupation: Retired  ?Tobacco Use  ? Smoking status: Never  ? Smokeless tobacco: Never  ?Vaping Use  ? Vaping Use: Never used  ?Substance and Sexual Activity  ? Alcohol use: No  ? Drug use: No  ? Sexual activity: Yes  ?  Birth control/protection: Surgical  ?Other Topics Concern  ? Not on file  ?Social History Narrative  ? Not on file  ? ?Social Determinants of Health  ? ?Financial Resource Strain: Not on file  ?Food Insecurity: No Food Insecurity  ? Worried About Charity fundraiser in the Last Year: Never true  ? Ran Out of Food in the Last Year: Never true  ?Transportation Needs: No Transportation Needs  ? Lack of Transportation (Medical): No  ? Lack of Transportation (Non-Medical): No  ?Physical Activity: Not on file  ?Stress: Not on file  ?Social Connections: Not on file  ?Intimate Partner Violence: Not on file  ? ? ?  Family History:  ?Family History  ?Problem Relation Age of Onset  ? Hypertension Mother   ? Other Mother   ?     Brain tumor  ? Osteoporosis Mother   ? Asthma Mother   ? Coronary artery disease Mother   ? Hypertension Father   ? Diabetes Father   ? Cancer - Lung Father   ? Colon polyps Father   ? Anemia Father   ? Osteoporosis Sister   ? Colon polyps Sister   ? Glaucoma Sister   ? Hyperthyroidism Sister   ? Rheum arthritis Sister   ? Breast cancer Paternal Aunt   ?     Age 60's  ? Breast cancer Paternal Grandmother   ?     Age 36  ? ? ?Medications:   ?Current Outpatient Medications on File Prior to Visit  ?Medication Sig Dispense Refill  ? ALPRAZolam (XANAX) 0.25 MG tablet Take 0.25 mg by mouth at bedtime as needed for anxiety or sleep.    ? atorvastatin (LIPITOR) 20 MG tablet Take 1 tablet (20 mg total) by mouth daily at 6 PM. (Patient taking differently: Take 20 mg by mouth every evening.) 30 tablet 2  ? cholecalciferol (VITAMIN D) 1000 UNITS tablet Take 1,000 Units by mouth every evening.    ? clopidogrel (PLAVIX) 75 MG  tablet Take 1 tablet (75 mg total) by mouth daily. (Patient taking differently: Take 75 mg by mouth every evening.) 30 tablet 2  ? diphenhydrAMINE (BENADRYL) 25 mg capsule Take 25 mg by mouth every 6 (six

## 2022-04-06 ENCOUNTER — Ambulatory Visit: Payer: Medicare HMO | Admitting: Neurology

## 2022-04-06 DIAGNOSIS — R410 Disorientation, unspecified: Secondary | ICD-10-CM

## 2022-04-07 ENCOUNTER — Emergency Department (HOSPITAL_BASED_OUTPATIENT_CLINIC_OR_DEPARTMENT_OTHER)
Admission: EM | Admit: 2022-04-07 | Discharge: 2022-04-08 | Disposition: A | Discharge status: Home / Self Care | Payer: Medicare HMO | Source: Home / Self Care | Attending: Emergency Medicine | Admitting: Emergency Medicine

## 2022-04-07 ENCOUNTER — Emergency Department (HOSPITAL_COMMUNITY): Payer: Medicare HMO

## 2022-04-07 ENCOUNTER — Encounter (HOSPITAL_COMMUNITY): Payer: Self-pay

## 2022-04-07 DIAGNOSIS — R630 Anorexia: Secondary | ICD-10-CM | POA: Diagnosis present

## 2022-04-07 DIAGNOSIS — R338 Other retention of urine: Secondary | ICD-10-CM | POA: Diagnosis not present

## 2022-04-07 DIAGNOSIS — R4701 Aphasia: Secondary | ICD-10-CM

## 2022-04-07 DIAGNOSIS — F419 Anxiety disorder, unspecified: Secondary | ICD-10-CM | POA: Diagnosis present

## 2022-04-07 DIAGNOSIS — Z950 Presence of cardiac pacemaker: Secondary | ICD-10-CM | POA: Diagnosis not present

## 2022-04-07 DIAGNOSIS — E785 Hyperlipidemia, unspecified: Secondary | ICD-10-CM | POA: Diagnosis present

## 2022-04-07 DIAGNOSIS — R4182 Altered mental status, unspecified: Secondary | ICD-10-CM | POA: Diagnosis present

## 2022-04-07 DIAGNOSIS — G9341 Metabolic encephalopathy: Secondary | ICD-10-CM | POA: Diagnosis present

## 2022-04-07 DIAGNOSIS — N390 Urinary tract infection, site not specified: Secondary | ICD-10-CM | POA: Diagnosis present

## 2022-04-07 DIAGNOSIS — R0902 Hypoxemia: Secondary | ICD-10-CM | POA: Diagnosis not present

## 2022-04-07 DIAGNOSIS — Z7902 Long term (current) use of antithrombotics/antiplatelets: Secondary | ICD-10-CM | POA: Insufficient documentation

## 2022-04-07 DIAGNOSIS — R001 Bradycardia, unspecified: Secondary | ICD-10-CM | POA: Diagnosis present

## 2022-04-07 DIAGNOSIS — R791 Abnormal coagulation profile: Secondary | ICD-10-CM | POA: Insufficient documentation

## 2022-04-07 DIAGNOSIS — I63012 Cerebral infarction due to thrombosis of left vertebral artery: Secondary | ICD-10-CM | POA: Diagnosis not present

## 2022-04-07 DIAGNOSIS — R4781 Slurred speech: Secondary | ICD-10-CM | POA: Diagnosis present

## 2022-04-07 DIAGNOSIS — A419 Sepsis, unspecified organism: Secondary | ICD-10-CM | POA: Diagnosis present

## 2022-04-07 DIAGNOSIS — R410 Disorientation, unspecified: Secondary | ICD-10-CM | POA: Diagnosis not present

## 2022-04-07 DIAGNOSIS — G9340 Encephalopathy, unspecified: Secondary | ICD-10-CM | POA: Diagnosis not present

## 2022-04-07 DIAGNOSIS — E876 Hypokalemia: Secondary | ICD-10-CM | POA: Diagnosis not present

## 2022-04-07 DIAGNOSIS — K5792 Diverticulitis of intestine, part unspecified, without perforation or abscess without bleeding: Secondary | ICD-10-CM | POA: Diagnosis not present

## 2022-04-07 DIAGNOSIS — Z885 Allergy status to narcotic agent status: Secondary | ICD-10-CM | POA: Diagnosis not present

## 2022-04-07 DIAGNOSIS — I700 Atherosclerosis of aorta: Secondary | ICD-10-CM | POA: Diagnosis not present

## 2022-04-07 DIAGNOSIS — Z20822 Contact with and (suspected) exposure to covid-19: Secondary | ICD-10-CM | POA: Insufficient documentation

## 2022-04-07 DIAGNOSIS — Z888 Allergy status to other drugs, medicaments and biological substances status: Secondary | ICD-10-CM | POA: Diagnosis not present

## 2022-04-07 DIAGNOSIS — I442 Atrioventricular block, complete: Secondary | ICD-10-CM | POA: Diagnosis not present

## 2022-04-07 DIAGNOSIS — R6889 Other general symptoms and signs: Secondary | ICD-10-CM | POA: Diagnosis not present

## 2022-04-07 DIAGNOSIS — I441 Atrioventricular block, second degree: Secondary | ICD-10-CM | POA: Diagnosis not present

## 2022-04-07 DIAGNOSIS — R451 Restlessness and agitation: Secondary | ICD-10-CM | POA: Diagnosis not present

## 2022-04-07 DIAGNOSIS — I10 Essential (primary) hypertension: Secondary | ICD-10-CM | POA: Diagnosis present

## 2022-04-07 DIAGNOSIS — Z9049 Acquired absence of other specified parts of digestive tract: Secondary | ICD-10-CM | POA: Diagnosis not present

## 2022-04-07 DIAGNOSIS — I959 Hypotension, unspecified: Secondary | ICD-10-CM | POA: Diagnosis present

## 2022-04-07 DIAGNOSIS — I639 Cerebral infarction, unspecified: Secondary | ICD-10-CM | POA: Diagnosis not present

## 2022-04-07 DIAGNOSIS — K219 Gastro-esophageal reflux disease without esophagitis: Secondary | ICD-10-CM | POA: Diagnosis present

## 2022-04-07 DIAGNOSIS — I499 Cardiac arrhythmia, unspecified: Secondary | ICD-10-CM | POA: Diagnosis not present

## 2022-04-07 DIAGNOSIS — E669 Obesity, unspecified: Secondary | ICD-10-CM | POA: Diagnosis present

## 2022-04-07 DIAGNOSIS — Z1152 Encounter for screening for COVID-19: Secondary | ICD-10-CM | POA: Diagnosis not present

## 2022-04-07 DIAGNOSIS — Z743 Need for continuous supervision: Secondary | ICD-10-CM | POA: Diagnosis not present

## 2022-04-07 DIAGNOSIS — K5732 Diverticulitis of large intestine without perforation or abscess without bleeding: Secondary | ICD-10-CM | POA: Diagnosis present

## 2022-04-07 DIAGNOSIS — R569 Unspecified convulsions: Secondary | ICD-10-CM | POA: Diagnosis present

## 2022-04-07 LAB — RAPID URINE DRUG SCREEN, HOSP PERFORMED
Amphetamines: NOT DETECTED
Barbiturates: NOT DETECTED
Benzodiazepines: NOT DETECTED
Cocaine: NOT DETECTED
Opiates: NOT DETECTED
Tetrahydrocannabinol: NOT DETECTED

## 2022-04-07 LAB — URINALYSIS, ROUTINE W REFLEX MICROSCOPIC
Bilirubin Urine: NEGATIVE
Glucose, UA: NEGATIVE mg/dL
Hgb urine dipstick: NEGATIVE
Ketones, ur: NEGATIVE mg/dL
Nitrite: NEGATIVE
Protein, ur: NEGATIVE mg/dL
Specific Gravity, Urine: 1.014 (ref 1.005–1.030)
pH: 5 (ref 5.0–8.0)

## 2022-04-07 LAB — PROTIME-INR
INR: 1.1 (ref 0.8–1.2)
Prothrombin Time: 14 seconds (ref 11.4–15.2)

## 2022-04-07 LAB — I-STAT CHEM 8, ED
BUN: 9 mg/dL (ref 8–23)
Calcium, Ion: 1 mmol/L — ABNORMAL LOW (ref 1.15–1.40)
Chloride: 101 mmol/L (ref 98–111)
Creatinine, Ser: 0.7 mg/dL (ref 0.44–1.00)
Glucose, Bld: 110 mg/dL — ABNORMAL HIGH (ref 70–99)
HCT: 43 % (ref 36.0–46.0)
Hemoglobin: 14.6 g/dL (ref 12.0–15.0)
Potassium: 3.4 mmol/L — ABNORMAL LOW (ref 3.5–5.1)
Sodium: 137 mmol/L (ref 135–145)
TCO2: 25 mmol/L (ref 22–32)

## 2022-04-07 LAB — COMPREHENSIVE METABOLIC PANEL
ALT: 14 U/L (ref 0–44)
AST: 24 U/L (ref 15–41)
Albumin: 3.8 g/dL (ref 3.5–5.0)
Alkaline Phosphatase: 67 U/L (ref 38–126)
Anion gap: 11 (ref 5–15)
BUN: 8 mg/dL (ref 8–23)
CO2: 24 mmol/L (ref 22–32)
Calcium: 9.4 mg/dL (ref 8.9–10.3)
Chloride: 103 mmol/L (ref 98–111)
Creatinine, Ser: 0.84 mg/dL (ref 0.44–1.00)
GFR, Estimated: >60 mL/min (ref >60)
Glucose, Bld: 109 mg/dL — ABNORMAL HIGH (ref 70–99)
Potassium: 3.5 mmol/L (ref 3.5–5.1)
Sodium: 138 mmol/L (ref 135–145)
Total Bilirubin: 0.9 mg/dL (ref 0.3–1.2)
Total Protein: 7.2 g/dL (ref 6.5–8.1)

## 2022-04-07 LAB — CBC
HCT: 41.7 % (ref 36.0–46.0)
Hemoglobin: 14 g/dL (ref 12.0–15.0)
MCH: 31.7 pg (ref 26.0–34.0)
MCHC: 33.6 g/dL (ref 30.0–36.0)
MCV: 94.6 fL (ref 80.0–100.0)
Platelets: 265 10*3/uL (ref 150–400)
RBC: 4.41 MIL/uL (ref 3.87–5.11)
RDW: 12.7 % (ref 11.5–15.5)
WBC: 18.4 10*3/uL — ABNORMAL HIGH (ref 4.0–10.5)
nRBC: 0 % (ref 0.0–0.2)

## 2022-04-07 LAB — DIFFERENTIAL
Abs Immature Granulocytes: 0.07 10*3/uL (ref 0.00–0.07)
Basophils Absolute: 0.1 10*3/uL (ref 0.0–0.1)
Basophils Relative: 0 %
Eosinophils Absolute: 0.1 10*3/uL (ref 0.0–0.5)
Eosinophils Relative: 1 %
Immature Granulocytes: 0 %
Lymphocytes Relative: 19 %
Lymphs Abs: 3.4 10*3/uL (ref 0.7–4.0)
Monocytes Absolute: 1.8 10*3/uL — ABNORMAL HIGH (ref 0.1–1.0)
Monocytes Relative: 10 %
Neutro Abs: 12.9 10*3/uL — ABNORMAL HIGH (ref 1.7–7.7)
Neutrophils Relative %: 70 %

## 2022-04-07 LAB — RESP PANEL BY RT-PCR (FLU A&B, COVID) ARPGX2
Influenza A by PCR: NEGATIVE
Influenza B by PCR: NEGATIVE
SARS Coronavirus 2 by RT PCR: NEGATIVE

## 2022-04-07 LAB — APTT: aPTT: 29 seconds (ref 24–36)

## 2022-04-07 LAB — ETHANOL: Alcohol, Ethyl (B): <10 mg/dL (ref <10)

## 2022-04-07 MED ORDER — LORAZEPAM 2 MG/ML IJ SOLN
0.5000 mg | Freq: Once | INTRAMUSCULAR | Status: DC
Start: 2022-04-07 — End: 2022-04-07

## 2022-04-07 MED ORDER — ALPRAZOLAM 0.25 MG PO TABS
0.5000 mg | ORAL_TABLET | Freq: Once | ORAL | Status: AC | PRN
  Administered 2022-04-08: 0.5 mg via ORAL
  Filled 2022-04-07: qty 2

## 2022-04-07 NOTE — Consult Note (Addendum)
NEUROLOGY CONSULTATION NOTE   Date of service: Apr 07, 2022 Patient Name: Jordan Lane MRN:  [id number] DOB:  [birth year] Reason for consult: "Stroke code for confusion" Requesting Provider: Charlynne Pander, MD _ _ _   _ __   _ __ _ _  __ __   _ __   __ _  History of Present Illness  Jordan Lane is a 76 y.o. female with PMH significant for hypertension, GERD, hyperlipidemia, prior episode of aphasia that resolved and concerning for a potential TIA vs aborted stroke from tNKASE who presents with recurrence of an episode of aphasia. Was on the phone with daughter in the evening around 1930 when she suddenly stopped talking and then would not answer the phone. Daughter who lives closeby went over and found her with one leg on the chair, sock on just one leg. She appeared confused and frustrated, phones were not put down. Daughter called EMS who noted that patient appeared confused, trouble following some commands, no focal weakness thou. She was brought in as a stroke code. On my evaluation, patient had no deficit.  She just saw neurology within the last week. Had a routine EEG obtained outpatient and plan to get an MRI Brain next week.  No tNKASE or thrombectomy was offered due to resolution of symptoms.  On discussion with daughter, seems like this episode was similar to her prior episode of aphasia in Oct 2022. Chart review demonstrates that workup at that time with MRI Brain negative for an acute stroke. rEEG with focal L hemispheric slowing. She had recurrence of aphasia while she was admitted in the ICU and was put on cEEG. However, no slowing noted on cEEG.   LKW: 1930 mRS: 0 tNKASE: not offered, due to resolution of symptoms. Thrombectomy: not offered. No deficit, resolution of symptoms. NIHSS components Score: Comment  1a Level of Conscious 0[x]  1[]  2[]  3[]      1b LOC Questions 0[x]  1[]  2[]       1c LOC Commands 0[x]  1[]  2[]       2 Best Gaze 0[x]  1[]  2[]       3 Visual 0[x]   1[]  2[]  3[]      4 Facial Palsy 0[x]  1[]  2[]  3[]      5a Motor Arm - left 0[x]  1[]  2[]  3[]  4[]  UN[]    5b Motor Arm - Right 0[x]  1[]  2[]  3[]  4[]  UN[]    6a Motor Leg - Left 0[x]  1[]  2[]  3[]  4[]  UN[]    6b Motor Leg - Right 0[x]  1[]  2[]  3[]  4[]  UN[]    7 Limb Ataxia 0[x]  1[]  2[]  3[]  UN[]     8 Sensory 0[x]  1[]  2[]  UN[]      9 Best Language 0[x]  1[]  2[]  3[]      10 Dysarthria 0[x]  1[]  2[]  UN[]      11 Extinct. and Inattention 0[x]  1[]  2[]       TOTAL: 0     ROS   Constitutional Denies weight loss, fever and chills.   HEENT Denies changes in vision and hearing.   Respiratory Denies SOB and cough.   CV Denies palpitations and CP   GI Denies abdominal pain, nausea, vomiting and diarrhea.   GU Denies dysuria and urinary frequency.   MSK Denies myalgia and joint pain.   Skin Denies rash and pruritus.   Neurological Denies headache and syncope.   Psychiatric Denies recent changes in mood. Denies anxiety and depression.    Past History   Past Medical History:  Diagnosis Date  . Arthritis   .  Chronic insomnia   . 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 DJD (degenerative joint disease)    knees, toe, right hip  . Fibroadenoma of breast 2011   Right  . Fractured shoulder 2018   right  . GERD (gastroesophageal reflux disease)   . H/O colonoscopy    tubular adenoma  . History of blood transfusion 52 yrs ago  . Hypertension   . IBS (irritable bowel syndrome)   . Impaired fasting glucose   . Left wrist fracture 06/2009  . Lower GI bleed 2010  . Osteopenia 2007   DEXA scan 1.7 hip  . Plantar fasciitis of right foot   . Positive colorectal cancer screening using Cologuard test 11/2018  . 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 Postcholecystectomy diarrhea   . Reflux   . Seasonal allergic rhinitis   . Shingles 2017   R V1  . Stroke [facility]) 10/2015   Past Surgical History:  Procedure Laterality Date  . ABDOMINAL HYSTERECTOMY  1984   TAH, partial  . BREAST BIOPSY Right 2011   . CATARACT EXTRACTION, BILATERAL Bilateral 2021  . CHOLECYSTECTOMY  2009  . ESOPHAGOGASTRODUODENOSCOPY  11/14/2012   Procedure: ESOPHAGOGASTRODUODENOSCOPY (EGD);  Surgeon: Graylin Shiver, MD;  Location: [facility] ENDOSCOPY;  Service: Endoscopy;  Laterality: N/A;  . ESOPHAGOGASTRODUODENOSCOPY (EGD) WITH PROPOFOL N/A 09/21/2016   Procedure: ESOPHAGOGASTRODUODENOSCOPY (EGD) WITH PROPOFOL;  Surgeon: Charolett Bumpers, MD;  Location: [facility] ENDOSCOPY;  Service: Endoscopy;  Laterality: N/A;  . ESOPHAGOGASTRODUODENOSCOPY (EGD) WITH PROPOFOL N/A 12/17/2020   Procedure: ESOPHAGOGASTRODUODENOSCOPY (EGD) WITH PROPOFOL;  Surgeon: Bernette Redbird, MD;  Location: [facility] ENDOSCOPY;  Service: Endoscopy;  Laterality: N/A;  . Herniated disc  1987   lower back   . KNEE SURGERY  left 2010, right 2013   Arthroscopic  . LUMBAR LAMINECTOMY Left   . PARTIAL KNEE ARTHROPLASTY  06/14/2012   Procedure: UNICOMPARTMENTAL KNEE;  Surgeon: Shelda Pal, MD;  Location: [facility] ORS;  Service: Orthopedics;  Laterality: Left;  Left Medial Unicompartmental Knee  . PARTIAL KNEE ARTHROPLASTY Right 11/27/2013   Procedure: UNICOMPARTMENTAL RIGHT KNEE, Steroid injection in right great toe;  Surgeon: Shelda Pal, MD;  Location: [facility] ORS;  Service: Orthopedics;  Laterality: Right;   Family History  Problem Relation Age of Onset  . Hypertension Mother   . Other Mother        Brain tumor  . Osteoporosis Mother   . Asthma Mother   . Coronary artery disease Mother   . Hypertension Father   . Diabetes Father   . Cancer - Lung Father   . Colon polyps Father   . Anemia Father   . Osteoporosis Sister   . Colon polyps Sister   . Glaucoma Sister   . Hyperthyroidism Sister   . Rheum arthritis Sister   . Breast cancer Paternal Aunt        Age 7's  . Breast cancer Paternal Grandmother        Age 36   Social History   Socioeconomic History  . Marital status: Married    Spouse name: Not on file  . Number of children: 3  . Years of education:  Not on file  . Highest education level: Not on file  Occupational History  . Occupation: Retired  Tobacco Use  . Smoking status: Never  . Smokeless tobacco: Never  Vaping Use  . Vaping Use: Never used  Substance and Sexual Activity  . Alcohol use: No  .  Drug use: No  . Sexual activity: Yes    Birth control/protection: Surgical  Other Topics Concern  . Not on file  Social History Narrative  . Not on file   Social Determinants of Health   Financial Resource Strain: Not on file  Food Insecurity: No Food Insecurity  . Worried About Programme researcher, broadcasting/film/video in the Last Year: Never true  . Ran Out of Food in the Last Year: Never true  Transportation Needs: No Transportation Needs  . Lack of Transportation (Medical): No  . Lack of Transportation (Non-Medical): No  Physical Activity: Not on file  Stress: Not on file  Social Connections: Not on file   Allergies  Allergen Reactions  . Nexium [Esomeprazole] Diarrhea  . Codeine Nausea And Vomiting  . Lisinopril Cough  . Lorazepam     Other reaction(s): felt funny  . Losartan Potassium-Hctz Other (See Comments)    Other reaction(s): dizzy  . Omeprazole Diarrhea    Other reaction(s): diarrhea  . Paroxetine Nausea Only  . Sertraline Hcl Nausea Only  . Zofran [Ondansetron]     Medications  (Not in a hospital admission)    Vitals   Vitals:   04/07/22 2152 04/07/22 2153 04/07/22 2153 04/07/22 2215  BP:    122/84  Pulse: 85 83  80  Resp: 15 12  16   Temp:   98.2 F (36.8 C)   TempSrc:   Oral   SpO2: 97% 96%  97%  Weight:  68 kg    Height:  5\' 2"  (1.575 m)       Body mass index is 27.44 kg/m.  Physical Exam   General: Laying comfortably in bed; in no acute distress.  HENT: Normal oropharynx and mucosa. Normal external appearance of ears and nose.  Neck: Supple, no pain or tenderness  CV: No JVD. No peripheral edema.  Pulmonary: Symmetric Chest rise. Normal respiratory effort.  Abdomen: Soft to touch, non-tender.   Ext: No cyanosis, edema, or deformity  Skin: No rash. Normal palpation of skin.   Musculoskeletal: Normal digits and nails by inspection. No clubbing.   Neurologic Examination  Mental status/Cognition: Alert, oriented to self, place, month and year, good attention.  Speech/language: Fluent, comprehension intact, object naming intact, repetition intact.  Cranial nerves:   CN II Pupils equal and reactive to light, no VF deficits    CN III,IV,VI EOM intact, no gaze preference or deviation, no nystagmus    CN V normal sensation in V1, V2, and V3 segments bilaterally    CN VII no asymmetry, no nasolabial fold flattening    CN VIII normal hearing to speech    CN IX & X normal palatal elevation, no uvular deviation    CN XI 5/5 head turn and 5/5 shoulder shrug bilaterally    CN XII midline tongue protrusion    Motor:  Muscle bulk: normal, tone normal, pronator drift none tremor none Mvmt Root Nerve  Muscle Right Left Comments  SA C5/6 Ax Deltoid 5 5   EF C5/6 Mc Biceps 5 5   EE C6/7/8 Rad Triceps 5 5   WF C6/7 Med FCR     WE C7/8 PIN ECU     F Ab C8/T1 U ADM/FDI 5 5   HF L1/2/3 Fem Illopsoas 5 5   KE L2/3/4 Fem Quad 5 5   DF L4/5 D Peron Tib Ant 5 5   PF S1/2 Tibial Grc/Sol 5 5    Reflexes:  Right Left Comments  Pectoralis  Biceps (C5/6) 2 2   Brachioradialis (C5/6) 2 2    Triceps (C6/7) 2 2    Patellar (L3/4) 2 2    Achilles (S1)      Hoffman      Plantar     Jaw jerk    Sensation:  Light touch Intact throughout   Pin prick    Temperature    Vibration   Proprioception    Coordination/Complex Motor:  - Finger to Nose intact BL - Heel to shin intact BL - Rapid alternating movement are normal - Gait: Deferred.  Labs   CBC:  Recent Labs  Lab 04/07/22 2144 04/07/22 2147  WBC 18.4*  --   NEUTROABS 12.9*  --   HGB 14.0 14.6  HCT 41.7 43.0  MCV 94.6  --   PLT 265  --     Basic Metabolic Panel:  Lab Results  Component Value Date   NA 137 04/07/2022    K 3.4 (L) 04/07/2022   CO2 24 04/07/2022   GLUCOSE 110 (H) 04/07/2022   BUN 9 04/07/2022   CREATININE 0.70 04/07/2022   CALCIUM 9.4 04/07/2022   GFRNONAA >60 04/07/2022   GFRAA >60 10/21/2015   Lipid Panel:  Lab Results  Component Value Date   LDLCALC 45 09/12/2021   HgbA1c:  Lab Results  Component Value Date   HGBA1C 5.3 09/12/2021   Urine Drug Screen: No results found for: LABOPIA, COCAINSCRNUR, LABBENZ, AMPHETMU, THCU, LABBARB  Alcohol Level     Component Value Date/Time   [facility] <10 04/07/2022 2144    MRI Brain: pending  rEEG:  pending  Impression   Jordan Lane is a 76 y.o. female with PMH significant for hypertension, GERD, hyperlipidemia, prior episode of aphasia that resolved and concerning for a potential TIA vs aborted stroke from tNKASE who presents with recurrence of an episode of aphasia. Symptoms had resolved by the time she came in to the ED with no focal deficit noted. Patient has very poor recollection of what brought her to the ED. On discussion with daughter, episode seems to be concerning for aphasia with encephalopathy that was almost instantaneous in onset(happened while patient was on the phone) and resolved by the time she came to the ED.  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 She did have a routine EEG in Oct 2022 with L temporal slowing but a cEEG was completely normal.  Recommendations  - Recommend repeat rEEG - recommend MRI brain without contrast. - I would lean towards starting Keppra 500mg  BID if the MRI brain is unrevealing. _____________________________________________________________  This patient is critically ill and at significant risk of neurological worsening, death and care requires constant monitoring of vital signs, hemodynamics,respiratory and cardiac  monitoring, neurological assessment, discussion with family, other specialists and medical decision making of high complexity. I spent 40 minutes of neurocritical care time  in the care of  this patient. This was time spent independent of any time provided by nurse practitioner or PA.  Jordan Lane [facility] Pager Number [phone] 04/07/2022  10:51 PM   Thank you for the opportunity to take part in the care of this patient. If you have any further questions, please contact the neurology consultation attending.  Signed,  Jordan Lane [facility] Pager Number [phone] _ _ _  _ __   _ __ _ _  __ __   _ __   __ _  

## 2022-04-07 NOTE — ED Provider Notes (Signed)
?Jordan Lane ?Provider Note ? ? ?CSN: [id number] ?Arrival date & time: 04/07/22  2138 ? ?  ? ?History ? ?Chief Complaint  ?Patient presents with  ? Altered Mental Status  ?  Patient arrives from home via EMS due to altered mental status. Per family patient was not acting like her normal self around 1900 and was acting confused. Patient does have history of stroke but was downgraded once patient arrived to department. No deficits noted and patient answering all questions on arrival.   ? ? ?Jordan Lane is a 76 y.o. female.  Who presents emergency department for aphasia.  Patient was seen as a code stroke, evaluated by Dr. Annice Pih at the bridge.  I cleared her airway.  He canceled the code stroke and CT head.  Patient has a previous history of similar symptoms 1 year ago.  She was given TNKase at that time, had an MRI and EEGs both short and long all of which were negative.  Patient history is given by her family at bedside.  Patient was last known normal at 7 PM.  She was apparently not picking up the phone so when her daughters called the other daughter to go over and check on her.  The daughter at bedside states that she was around 9 PM and noted that her mother was having slurred speech and difficulty finding words.  This lasted for approximately 30 to 45 minutes and resolved prior to arrival.  She has no other complaints at this time, she denies urinary symptoms, recent illness. ? ? ?Altered Mental Status ? ?  ? ?Home Medications ?Prior to Admission medications   ?Medication Sig Start Date End Date Taking? Authorizing Provider  ?ALPRAZolam (XANAX) 0.25 MG tablet Take 0.25 mg by mouth at bedtime as needed for anxiety or sleep. 09/13/15   [provider]  ?atorvastatin (LIPITOR) 20 MG tablet Take 1 tablet (20 mg total) by mouth daily at 6 PM. ?Patient taking differently: Take 20 mg by mouth every evening. 10/23/15   Bonnielee Haff, MD  ?cholecalciferol  (VITAMIN D) 1000 UNITS tablet Take 1,000 Units by mouth every evening.    [provider]  ?clopidogrel (PLAVIX) 75 MG tablet Take 1 tablet (75 mg total) by mouth daily. ?Patient taking differently: Take 75 mg by mouth every evening. 10/23/15   Bonnielee Haff, MD  ?diphenhydrAMINE (BENADRYL) 25 mg capsule Take 25 mg by mouth every 6 (six) hours as needed for allergies.    [provider]  ?escitalopram (LEXAPRO) 10 MG tablet Take 10 mg by mouth at bedtime. 04/28/21   [provider]  ?famotidine (PEPCID) 20 MG tablet Take 20 mg by mouth daily as needed for heartburn or indigestion.    [provider]  ?fluticasone (FLONASE) 50 MCG/ACT nasal spray Place 1 spray into both nostrils daily as needed for allergies or rhinitis.    [provider]  ?KLOR-CON M20 20 MEQ tablet Take 20 mEq by mouth every evening. 06/20/21   [provider]  ?Lidocaine HCl (ASPERCREME LIDOCAINE) 4 % LIQD Apply 1 application topically as directed.    [provider]  ?loperamide (IMODIUM A-D) 2 MG tablet Take 2 mg by mouth 4 (four) times daily as needed for diarrhea or loose stools.    [provider]  ?Multiple Vitamin (MULTIVITAMIN) tablet Take 1 tablet by mouth every evening.    [provider]  ?omeprazole (PRILOSEC) 20 MG capsule Take 1 capsule by mouth daily.  [provider]  ?valsartan-hydrochlorothiazide (DIOVAN-HCT) 160-25 MG tablet Take 1 tablet by mouth every evening.    [provider]  ?vitamin B-12 (CYANOCOBALAMIN) 100 MCG tablet Take 100 mcg by mouth every evening.    [provider]  ?   ? ?Allergies    ?Nexium [esomeprazole], Codeine, Lisinopril, Lorazepam, Losartan potassium-hctz, Omeprazole, Paroxetine, Sertraline hcl, and Zofran [ondansetron]   ? ?Review of Systems   ?Review of Systems ? ?Physical Exam ?Updated Vital Signs ?BP (!) 164/72   Pulse 83   Temp 98.2 ?F (36.8 ?C) (Oral)   Resp 12   Ht '5\' 2"'$  (1.575 m)    Wt 68 kg   SpO2 96%   BMI 27.44 kg/m?  ?Physical Exam ?Vitals and nursing note reviewed.  ?Constitutional:   ?   General: She is not in acute distress. ?   Appearance: She is well-developed. She is not diaphoretic.  ?HENT:  ?   Head: Normocephalic and atraumatic.  ?   Right Ear: External ear normal.  ?   Left Ear: External ear normal.  ?   Nose: Nose normal.  ?   Mouth/Throat:  ?   Mouth: Mucous membranes are moist.  ?Eyes:  ?   General: No scleral icterus. ?   Conjunctiva/sclera: Conjunctivae normal.  ?Cardiovascular:  ?   Rate and Rhythm: Normal rate and regular rhythm.  ?   Heart sounds: Normal heart sounds. No murmur heard. ?  No friction rub. No gallop.  ?Pulmonary:  ?   Effort: Pulmonary effort is normal. No respiratory distress.  ?   Breath sounds: Normal breath sounds.  ?Abdominal:  ?   General: Bowel sounds are normal. There is no distension.  ?   Palpations: Abdomen is soft. There is no mass.  ?   Tenderness: There is no abdominal tenderness. There is no guarding.  ?Musculoskeletal:  ?   Cervical back: Normal range of motion.  ?Skin: ?   General: Skin is warm and dry.  ?Neurological:  ?   General: No focal deficit present.  ?   Mental Status: She is alert and oriented to person, place, and time.  ?   Cranial Nerves: No cranial nerve deficit.  ?   Sensory: No sensory deficit.  ?   Motor: No weakness.  ?   Coordination: Coordination normal.  ?   Gait: Gait normal.  ?   Deep Tendon Reflexes: Reflexes normal.  ?Psychiatric:     ?   Behavior: Behavior normal.  ? ? ?ED Results / Procedures / Treatments   ?Labs ?(all labs ordered are listed, but only abnormal results are displayed) ?Labs Reviewed  ?CBC - Abnormal; Notable for the following components:  ?    Result Value  ? WBC 18.4 (*)   ? All other components within normal limits  ?DIFFERENTIAL - Abnormal; Notable for the following components:  ? Neutro Abs 12.9 (*)   ? Monocytes Absolute 1.8 (*)   ? All other components within normal limits  ?I-STAT CHEM 8,  ED - Abnormal; Notable for the following components:  ? Potassium 3.4 (*)   ? Glucose, Bld 110 (*)   ? Calcium, Ion 1.00 (*)   ? All other components within normal limits  ?RESP PANEL BY RT-PCR (FLU A&B, COVID) ARPGX2  ?ETHANOL  ?PROTIME-INR  ?APTT  ?COMPREHENSIVE METABOLIC PANEL  ?RAPID URINE DRUG SCREEN, HOSP PERFORMED  ?URINALYSIS, ROUTINE W REFLEX MICROSCOPIC  ? ? ?EKG ?None ? ?Radiology ?No results found. ? ?Procedures ?Procedures  ? ? ?  Medications Ordered in ED ?Medications  ?LORazepam (ATIVAN) injection 0.5 mg (has no administration in time range)  ? ? ?ED Course/ Medical Decision Making/ A&P ?Clinical Course as of 04/10/22 1148  ?Wed Apr 08, 2022  ?[place] [MB]  ?  ?Clinical Course User Index ?[MB] Garald Balding, PA-C  ? ?                        ?Medical Decision Making ?Amount and/or Complexity of Data Reviewed ?Labs: ordered. ?Radiology: ordered. Decision-making details documented in ED Course. ? ?Risk ?Prescription drug management. ? ? ?Patient's work-up is initiated.  Orders placed.  Work-up pending, signout given at shift handoff ? ? ?Final Clinical Impression(s) / ED Diagnoses ?Final diagnoses:  ?None  ? ? ?Rx / DC Orders ?ED Discharge Orders   ? ? None  ? ?  ? ? ?  ?Margarita Mail, PA-C ?04/10/22 1149 ? ?  ?Drenda Freeze, MD ?04/10/22 1451 ? ?

## 2022-04-07 NOTE — ED Notes (Signed)
Neurology at bedside to evaluate patient.

## 2022-04-08 ENCOUNTER — Emergency Department (HOSPITAL_COMMUNITY): Payer: Medicare HMO

## 2022-04-08 ENCOUNTER — Other Ambulatory Visit: Payer: Self-pay

## 2022-04-08 ENCOUNTER — Ambulatory Visit (INDEPENDENT_AMBULATORY_CARE_PROVIDER_SITE_OTHER): Payer: Medicare HMO

## 2022-04-08 ENCOUNTER — Inpatient Hospital Stay (HOSPITAL_COMMUNITY)
Admission: EM | Admit: 2022-04-08 | Discharge: 2022-04-18 | DRG: 853 | Disposition: A | Discharge status: Home Health | Payer: Medicare HMO | Attending: Family Medicine | Admitting: Family Medicine

## 2022-04-08 ENCOUNTER — Encounter (HOSPITAL_COMMUNITY): Payer: Self-pay | Admitting: *Deleted

## 2022-04-08 ENCOUNTER — Telehealth: Payer: Self-pay | Admitting: Adult Health

## 2022-04-08 DIAGNOSIS — I700 Atherosclerosis of aorta: Secondary | ICD-10-CM | POA: Diagnosis not present

## 2022-04-08 DIAGNOSIS — A419 Sepsis, unspecified organism: Principal | ICD-10-CM | POA: Diagnosis present

## 2022-04-08 DIAGNOSIS — Z8673 Personal history of transient ischemic attack (TIA), and cerebral infarction without residual deficits: Secondary | ICD-10-CM

## 2022-04-08 DIAGNOSIS — F419 Anxiety disorder, unspecified: Secondary | ICD-10-CM | POA: Diagnosis present

## 2022-04-08 DIAGNOSIS — K5792 Diverticulitis of intestine, part unspecified, without perforation or abscess without bleeding: Secondary | ICD-10-CM | POA: Diagnosis not present

## 2022-04-08 DIAGNOSIS — I959 Hypotension, unspecified: Secondary | ICD-10-CM | POA: Diagnosis present

## 2022-04-08 DIAGNOSIS — G9340 Encephalopathy, unspecified: Secondary | ICD-10-CM | POA: Diagnosis not present

## 2022-04-08 DIAGNOSIS — R0902 Hypoxemia: Secondary | ICD-10-CM | POA: Diagnosis not present

## 2022-04-08 DIAGNOSIS — Z6829 Body mass index (BMI) 29.0-29.9, adult: Secondary | ICD-10-CM

## 2022-04-08 DIAGNOSIS — G9341 Metabolic encephalopathy: Secondary | ICD-10-CM | POA: Diagnosis present

## 2022-04-08 DIAGNOSIS — R630 Anorexia: Secondary | ICD-10-CM | POA: Diagnosis present

## 2022-04-08 DIAGNOSIS — Z79899 Other long term (current) drug therapy: Secondary | ICD-10-CM

## 2022-04-08 DIAGNOSIS — K5732 Diverticulitis of large intestine without perforation or abscess without bleeding: Secondary | ICD-10-CM

## 2022-04-08 DIAGNOSIS — R001 Bradycardia, unspecified: Secondary | ICD-10-CM | POA: Diagnosis not present

## 2022-04-08 DIAGNOSIS — R569 Unspecified convulsions: Secondary | ICD-10-CM

## 2022-04-08 DIAGNOSIS — Z833 Family history of diabetes mellitus: Secondary | ICD-10-CM

## 2022-04-08 DIAGNOSIS — R4182 Altered mental status, unspecified: Secondary | ICD-10-CM | POA: Diagnosis not present

## 2022-04-08 DIAGNOSIS — Z888 Allergy status to other drugs, medicaments and biological substances status: Secondary | ICD-10-CM

## 2022-04-08 DIAGNOSIS — R4701 Aphasia: Secondary | ICD-10-CM | POA: Diagnosis present

## 2022-04-08 DIAGNOSIS — I499 Cardiac arrhythmia, unspecified: Secondary | ICD-10-CM | POA: Diagnosis not present

## 2022-04-08 DIAGNOSIS — I639 Cerebral infarction, unspecified: Secondary | ICD-10-CM | POA: Diagnosis not present

## 2022-04-08 DIAGNOSIS — Z1152 Encounter for screening for COVID-19: Secondary | ICD-10-CM

## 2022-04-08 DIAGNOSIS — Z45018 Encounter for adjustment and management of other part of cardiac pacemaker: Secondary | ICD-10-CM

## 2022-04-08 DIAGNOSIS — R451 Restlessness and agitation: Secondary | ICD-10-CM | POA: Diagnosis not present

## 2022-04-08 DIAGNOSIS — K219 Gastro-esophageal reflux disease without esophagitis: Secondary | ICD-10-CM | POA: Diagnosis present

## 2022-04-08 DIAGNOSIS — Z825 Family history of asthma and other chronic lower respiratory diseases: Secondary | ICD-10-CM

## 2022-04-08 DIAGNOSIS — R4781 Slurred speech: Secondary | ICD-10-CM | POA: Diagnosis present

## 2022-04-08 DIAGNOSIS — E876 Hypokalemia: Secondary | ICD-10-CM | POA: Diagnosis not present

## 2022-04-08 DIAGNOSIS — I10 Essential (primary) hypertension: Secondary | ICD-10-CM | POA: Diagnosis not present

## 2022-04-08 DIAGNOSIS — E669 Obesity, unspecified: Secondary | ICD-10-CM | POA: Diagnosis present

## 2022-04-08 DIAGNOSIS — Z885 Allergy status to narcotic agent status: Secondary | ICD-10-CM

## 2022-04-08 DIAGNOSIS — I442 Atrioventricular block, complete: Secondary | ICD-10-CM | POA: Diagnosis not present

## 2022-04-08 DIAGNOSIS — I63012 Cerebral infarction due to thrombosis of left vertebral artery: Secondary | ICD-10-CM | POA: Diagnosis present

## 2022-04-08 DIAGNOSIS — Z8371 Family history of colonic polyps: Secondary | ICD-10-CM

## 2022-04-08 DIAGNOSIS — Z90710 Acquired absence of both cervix and uterus: Secondary | ICD-10-CM

## 2022-04-08 DIAGNOSIS — E785 Hyperlipidemia, unspecified: Secondary | ICD-10-CM | POA: Diagnosis present

## 2022-04-08 DIAGNOSIS — Z8249 Family history of ischemic heart disease and other diseases of the circulatory system: Secondary | ICD-10-CM

## 2022-04-08 DIAGNOSIS — Z83511 Family history of glaucoma: Secondary | ICD-10-CM

## 2022-04-08 DIAGNOSIS — Z803 Family history of malignant neoplasm of breast: Secondary | ICD-10-CM

## 2022-04-08 DIAGNOSIS — N390 Urinary tract infection, site not specified: Secondary | ICD-10-CM | POA: Diagnosis present

## 2022-04-08 DIAGNOSIS — R338 Other retention of urine: Secondary | ICD-10-CM | POA: Diagnosis not present

## 2022-04-08 DIAGNOSIS — Z8262 Family history of osteoporosis: Secondary | ICD-10-CM

## 2022-04-08 DIAGNOSIS — Z7902 Long term (current) use of antithrombotics/antiplatelets: Secondary | ICD-10-CM

## 2022-04-08 DIAGNOSIS — Z9049 Acquired absence of other specified parts of digestive tract: Secondary | ICD-10-CM

## 2022-04-08 DIAGNOSIS — Z743 Need for continuous supervision: Secondary | ICD-10-CM | POA: Diagnosis not present

## 2022-04-08 DIAGNOSIS — R6889 Other general symptoms and signs: Secondary | ICD-10-CM | POA: Diagnosis not present

## 2022-04-08 DIAGNOSIS — Z96651 Presence of right artificial knee joint: Secondary | ICD-10-CM | POA: Diagnosis present

## 2022-04-08 LAB — CBC WITH DIFFERENTIAL/PLATELET
Abs Immature Granulocytes: 0.11 10*3/uL — ABNORMAL HIGH (ref 0.00–0.07)
Basophils Absolute: 0.1 10*3/uL (ref 0.0–0.1)
Basophils Relative: 0 %
Eosinophils Absolute: 0 10*3/uL (ref 0.0–0.5)
Eosinophils Relative: 0 %
HCT: 40 % (ref 36.0–46.0)
Hemoglobin: 13.9 g/dL (ref 12.0–15.0)
Immature Granulocytes: 1 %
Lymphocytes Relative: 11 %
Lymphs Abs: 2.2 10*3/uL (ref 0.7–4.0)
MCH: 32 pg (ref 26.0–34.0)
MCHC: 34.8 g/dL (ref 30.0–36.0)
MCV: 92 fL (ref 80.0–100.0)
Monocytes Absolute: 1.9 10*3/uL — ABNORMAL HIGH (ref 0.1–1.0)
Monocytes Relative: 9 %
Neutro Abs: 16 10*3/uL — ABNORMAL HIGH (ref 1.7–7.7)
Neutrophils Relative %: 79 %
Platelets: 298 10*3/uL (ref 150–400)
RBC: 4.35 MIL/uL (ref 3.87–5.11)
RDW: 12.7 % (ref 11.5–15.5)
WBC: 20.3 10*3/uL — ABNORMAL HIGH (ref 4.0–10.5)
nRBC: 0 % (ref 0.0–0.2)

## 2022-04-08 LAB — I-STAT VENOUS BLOOD GAS, ED
Acid-Base Excess: 3 mmol/L — ABNORMAL HIGH (ref 0.0–2.0)
Bicarbonate: 28.8 mmol/L — ABNORMAL HIGH (ref 20.0–28.0)
Calcium, Ion: 1.14 mmol/L — ABNORMAL LOW (ref 1.15–1.40)
HCT: 41 % (ref 36.0–46.0)
Hemoglobin: 13.9 g/dL (ref 12.0–15.0)
O2 Saturation: 48 %
Potassium: 3.2 mmol/L — ABNORMAL LOW (ref 3.5–5.1)
Sodium: 140 mmol/L (ref 135–145)
TCO2: 30 mmol/L (ref 22–32)
pCO2, Ven: 47.4 mmHg (ref 44–60)
pH, Ven: 7.392 (ref 7.25–7.43)
pO2, Ven: 26 mmHg — CL (ref 32–45)

## 2022-04-08 LAB — COMPREHENSIVE METABOLIC PANEL
ALT: 12 U/L (ref 0–44)
AST: 18 U/L (ref 15–41)
Albumin: 3.4 g/dL — ABNORMAL LOW (ref 3.5–5.0)
Alkaline Phosphatase: 64 U/L (ref 38–126)
Anion gap: 8 (ref 5–15)
BUN: 10 mg/dL (ref 8–23)
CO2: 29 mmol/L (ref 22–32)
Calcium: 9.1 mg/dL (ref 8.9–10.3)
Chloride: 104 mmol/L (ref 98–111)
Creatinine, Ser: 0.92 mg/dL (ref 0.44–1.00)
GFR, Estimated: >60 mL/min (ref >60)
Glucose, Bld: 124 mg/dL — ABNORMAL HIGH (ref 70–99)
Potassium: 3.2 mmol/L — ABNORMAL LOW (ref 3.5–5.1)
Sodium: 141 mmol/L (ref 135–145)
Total Bilirubin: 1.9 mg/dL — ABNORMAL HIGH (ref 0.3–1.2)
Total Protein: 7 g/dL (ref 6.5–8.1)

## 2022-04-08 LAB — APTT: aPTT: 31 seconds (ref 24–36)

## 2022-04-08 LAB — CBG MONITORING, ED: Glucose-Capillary: 119 mg/dL — ABNORMAL HIGH (ref 70–99)

## 2022-04-08 LAB — LACTIC ACID, PLASMA: Lactic Acid, Venous: 1.2 mmol/L (ref 0.5–1.9)

## 2022-04-08 LAB — PROTIME-INR
INR: 1.2 (ref 0.8–1.2)
Prothrombin Time: 14.8 seconds (ref 11.4–15.2)

## 2022-04-08 MED ORDER — IOHEXOL 300 MG/ML  SOLN
80.0000 mL | Freq: Once | INTRAMUSCULAR | Status: AC | PRN
  Administered 2022-04-08: 80 mL via INTRAVENOUS

## 2022-04-08 MED ORDER — CEPHALEXIN 500 MG PO CAPS: 500.0000 mg | ORAL_CAPSULE | Freq: Two times a day (BID) | ORAL | 0 refills | Status: DC

## 2022-04-08 MED ORDER — CEPHALEXIN 500 MG PO CAPS: 500.0000 mg | ORAL_CAPSULE | Freq: Four times a day (QID) | ORAL | 0 refills | Status: DC

## 2022-04-08 MED ORDER — LEVETIRACETAM 500 MG PO TABS
500.0000 mg | ORAL_TABLET | Freq: Once | ORAL | Status: AC
  Administered 2022-04-08: 500 mg via ORAL
  Filled 2022-04-08: qty 1

## 2022-04-08 MED ORDER — GADOBUTROL 1 MMOL/ML IV SOLN
7.0000 mL | Freq: Once | INTRAVENOUS | Status: AC | PRN
  Administered 2022-04-08: 7 mL via INTRAVENOUS

## 2022-04-08 MED ORDER — LEVETIRACETAM 500 MG PO TABS: 500.0000 mg | ORAL_TABLET | Freq: Two times a day (BID) | ORAL | 0 refills | Status: AC

## 2022-04-08 MED ORDER — SODIUM CHLORIDE 0.9 % IV BOLUS
1000.0000 mL | Freq: Once | INTRAVENOUS | Status: AC
  Administered 2022-04-08: 1000 mL via INTRAVENOUS

## 2022-04-08 NOTE — Telephone Encounter (Signed)
Pt's daughter is asking for a call with results to EEG just as soon as they are available ?

## 2022-04-08 NOTE — ED Notes (Signed)
Patient provided with warm blankets at this time. NIBP, cardiac monitor and pulse ox remain in place.  ?

## 2022-04-08 NOTE — Discharge Instructions (Signed)
You were evaluated in the Emergency Department and after careful evaluation, we did not find any emergent condition requiring admission or further testing in the hospital. ? ?Your MRI did not show any signs of acute stroke today.  Your EEG read is still pending.  Please start taking Keppra twice daily as directed.  We will also treat you for a possible UTI.  Please make sure to follow-up with [facility] neurology for follow-up on your EEG read and further evaluation. ? ?Please return to the Emergency Department if you experience any worsening of your condition.  Thank you for allowing [place] to be a part of your care. ? ?

## 2022-04-08 NOTE — ED Provider Notes (Addendum)
Care of the patient received from [facility].  Please see her note for full HPI. ? ?In short, 76 year old female who presented to the ER with aphasia, seen as a code stroke and evaluated by Dr. Lorrin Goodell  who canceled the code stroke and the CT of the head.  She had a very similar presentation about a year ago and was given TNKase at this time, had a MRI and EEG which were negative. ? ?Lab work overall reassuring, CBC without leukocytosis, CMP without any significant abnormalities.  UDS negative.  UA with moderate leukocytes and rare bacteria, sent for culture.  EKG normal sinus rhythm.COVID and flu are negative.  Per discussion with neurology, care of the patient signed out to me pending MRI imaging and EEG.  ? ?I personally reviewed and interpreted her imaging.  Agree with radiology read. ? ?   ?IMPRESSION:  ?1. No acute finding.  No focal cortical abnormality.  ?2. Extensive chronic small vessel ischemia.  ?3. Motion artifact.  ?Unclear cause of patient's symptoms but will treat for UTI given leukocytosis, leukocytes and rare bacteria on UA in the setting of altered mental status. No signs of sepsis.  Also per discussion w/ Dr. Lorrin Goodell, will start on [place] with concerns for possible seizure activty and have her follow up with [facility] neurology .EEG read still pending, will need to follow up on the results OP. Patient has returned to baseline. Discussed outpatient neurology follow-up w/ patient and family at bedside. They  voiced understanding and is agreeable.  Stable for discharge ? ?Results for orders placed or performed during the hospital encounter of 04/07/22  ?Resp Panel by RT-PCR (Flu A&B, Covid) Nasopharyngeal Swab  ? Specimen: Nasopharyngeal Swab; Nasopharyngeal(NP) swabs in vial transport medium  ?Result Value Ref Range  ? SARS Coronavirus 2 by RT PCR NEGATIVE NEGATIVE  ? Influenza A by PCR NEGATIVE NEGATIVE  ? Influenza B by PCR NEGATIVE NEGATIVE  ?Ethanol  ?Result Value Ref Range  ? Alcohol,  Ethyl (B) <10 <10 mg/dL  ?CBC  ?Result Value Ref Range  ? WBC 18.4 (H) 4.0 - 10.5 K/uL  ? RBC 4.41 3.87 - 5.11 MIL/uL  ? Hemoglobin 14.0 12.0 - 15.0 g/dL  ? HCT 41.7 36.0 - 46.0 %  ? MCV 94.6 80.0 - 100.0 fL  ? MCH 31.7 26.0 - 34.0 pg  ? MCHC 33.6 30.0 - 36.0 g/dL  ? RDW 12.7 11.5 - 15.5 %  ? Platelets 265 150 - 400 K/uL  ? nRBC 0.0 0.0 - 0.2 %  ?Differential  ?Result Value Ref Range  ? Neutrophils Relative % 70 %  ? Neutro Abs 12.9 (H) 1.7 - 7.7 K/uL  ? Lymphocytes Relative 19 %  ? Lymphs Abs 3.4 0.7 - 4.0 K/uL  ? Monocytes Relative 10 %  ? Monocytes Absolute 1.8 (H) 0.1 - 1.0 K/uL  ? Eosinophils Relative 1 %  ? Eosinophils Absolute 0.1 0.0 - 0.5 K/uL  ? Basophils Relative 0 %  ? Basophils Absolute 0.1 0.0 - 0.1 K/uL  ? Immature Granulocytes 0 %  ? Abs Immature Granulocytes 0.07 0.00 - 0.07 K/uL  ?Comprehensive metabolic panel  ?Result Value Ref Range  ? Sodium 138 135 - 145 mmol/L  ? Potassium 3.5 3.5 - 5.1 mmol/L  ? Chloride 103 98 - 111 mmol/L  ? CO2 24 22 - 32 mmol/L  ? Glucose, Bld 109 (H) 70 - 99 mg/dL  ? BUN 8 8 - 23 mg/dL  ? Creatinine, Ser 0.84 0.44 -  1.00 mg/dL  ? Calcium 9.4 8.9 - 10.3 mg/dL  ? Total Protein 7.2 6.5 - 8.1 g/dL  ? Albumin 3.8 3.5 - 5.0 g/dL  ? AST 24 15 - 41 U/L  ? ALT 14 0 - 44 U/L  ? Alkaline Phosphatase 67 38 - 126 U/L  ? Total Bilirubin 0.9 0.3 - 1.2 mg/dL  ? GFR, Estimated >60 >60 mL/min  ? Anion gap 11 5 - 15  ?Urine rapid drug screen (hosp performed)  ?Result Value Ref Range  ? Opiates NONE DETECTED NONE DETECTED  ? Cocaine NONE DETECTED NONE DETECTED  ? Benzodiazepines NONE DETECTED NONE DETECTED  ? Amphetamines NONE DETECTED NONE DETECTED  ? Tetrahydrocannabinol NONE DETECTED NONE DETECTED  ? Barbiturates NONE DETECTED NONE DETECTED  ?Urinalysis, Routine w reflex microscopic Urine, Clean Catch  ?Result Value Ref Range  ? Color, Urine YELLOW YELLOW  ? APPearance CLEAR CLEAR  ? Specific Gravity, Urine 1.014 1.005 - 1.030  ? pH 5.0 5.0 - 8.0  ? Glucose, UA NEGATIVE NEGATIVE mg/dL  ?  Hgb urine dipstick NEGATIVE NEGATIVE  ? Bilirubin Urine NEGATIVE NEGATIVE  ? Ketones, ur NEGATIVE NEGATIVE mg/dL  ? Protein, ur NEGATIVE NEGATIVE mg/dL  ? Nitrite NEGATIVE NEGATIVE  ? Leukocytes,Ua MODERATE (A) NEGATIVE  ? RBC / HPF 0-5 0 - 5 RBC/hpf  ? WBC, UA 6-10 0 - 5 WBC/hpf  ? Bacteria, UA RARE (A) NONE SEEN  ? Squamous Epithelial / LPF 0-5 0 - 5  ? Mucus PRESENT   ?Protime-INR  ?Result Value Ref Range  ? Prothrombin Time 14.0 11.4 - 15.2 seconds  ? INR 1.1 0.8 - 1.2  ?APTT  ?Result Value Ref Range  ? aPTT 29 24 - 36 seconds  ?I-stat chem 8, ED  ?Result Value Ref Range  ? Sodium 137 135 - 145 mmol/L  ? Potassium 3.4 (L) 3.5 - 5.1 mmol/L  ? Chloride 101 98 - 111 mmol/L  ? BUN 9 8 - 23 mg/dL  ? Creatinine, Ser 0.70 0.44 - 1.00 mg/dL  ? Glucose, Bld 110 (H) 70 - 99 mg/dL  ? Calcium, Ion 1.00 (L) 1.15 - 1.40 mmol/L  ? TCO2 25 22 - 32 mmol/L  ? Hemoglobin 14.6 12.0 - 15.0 g/dL  ? HCT 43.0 36.0 - 46.0 %  ? ?MR BRAIN W WO CONTRAST ? ?Result Date: 04/08/2022 ?CLINICAL DATA:  Canceled stroke workup.  Seizure evaluation. EXAM: MRI HEAD WITHOUT AND WITH CONTRAST TECHNIQUE: Multiplanar, multiecho pulse sequences of the brain and surrounding structures were obtained without and with intravenous contrast. CONTRAST:  68m GADAVIST GADOBUTROL 1 MMOL/ML IV SOLN COMPARISON:  09/12/2021 FINDINGS: Brain: No acute infarction, hemorrhage, hydrocephalus, extra-axial collection or mass lesion. Confluent FLAIR hyperintensity in the cerebral white matter with superimposed lacunar infarcts in the bilateral centrum semiovale (with wallerian degeneration crossing the corpus callosum) and in the right thalamus. Accentuated dilated perivascular spaces at the basal ganglia. Cerebral volume loss is preserved for age. No chronic hemorrhagic injury Vascular: Major flow voids and vascular enhancements are preserved Skull and upper cervical spine: Normal marrow signal Sinuses/Orbits: Bilateral cataract resection. Essentially clear sinuses. Other:  Intermittent, progressive motion artifact. IMPRESSION: 1. No acute finding.  No focal cortical abnormality. 2. Extensive chronic small vessel ischemia. 3. Motion artifact. Electronically Signed   By: JJorje GuildM.D.   On: 04/08/2022 04:06   ? ? ?  ? ? ?  ?BGarald Balding PA-C ?04/08/22 0454 ? ?  ?DVeryl Speak MD ?04/08/22 07253? ?

## 2022-04-08 NOTE — ED Provider Notes (Signed)
?[facility] ?Provider Note ? ? ?CSN: [id number] ?Arrival date & time: 04/08/22  2125 ? ?  ? ?History ? ?Chief Complaint  ?Patient presents with  ? Altered Mental Status  ? ? ?Jordan Lane is a 76 y.o. female. ? ?Jordan Lane is a 76 y.o. female with a history of hypertension, stroke, GERD, IBS, degenerative joint disease, who presents to the emergency department via EMS for evaluation of altered mental status.  She is accompanied by her husband and daughters to help to provide history.  Last known well time was noon.  Of note patient was seen in the emergency department yesterday and initially arrived as a code stroke, code stroke was canceled patient was not a candidate for TNK but was having some altered mental status and garbled speech.  MRI negative for stroke and EEG without seizure activity.  Patient was started on Keppra and she was started on antibiotics for potential UTI.  Family report initially when she went home she seemed to be doing okay but throughout the afternoon became increasingly altered and weak.  They report she had nonsensical speech intermittently and wanted to sleep throughout the whole day which is atypical for her.  They report later in the day she was so weak that she could not get up without assistance.  When EMS arrived patient had a blood pressure of 82/60, given 1 L of fluids with improvement.  Daughters at bedside reports that her health has been declining over the past few months but that this has been a more acute change.  Patient has been complaining of pain all over throughout the day, she has difficulty providing history or more detail regarding pain.  When asked about pain in the chest, abdomen or back she just answers yes to all questions but cannot provide any further details.  Intermittently has nonsensical speech when responding to questions. ? ?The history is provided by the patient and a relative.  ?Altered Mental Status ? ?   ? ?Home Medications ?Prior to Admission medications   ?Medication Sig Start Date End Date Taking? Authorizing Provider  ?ALPRAZolam (XANAX) 0.25 MG tablet Take 0.25 mg by mouth at bedtime as needed for anxiety or sleep. 09/13/15   [provider]  ?atorvastatin (LIPITOR) 20 MG tablet Take 1 tablet (20 mg total) by mouth daily at 6 PM. ?Patient taking differently: Take 20 mg by mouth every evening. 10/23/15   Bonnielee Haff, MD  ?cephALEXin (KEFLEX) 500 MG capsule Take 1 capsule (500 mg total) by mouth 2 (two) times daily for 7 days. 04/08/22 04/15/22  Garald Balding, PA-C  ?cholecalciferol (VITAMIN D) 1000 UNITS tablet Take 1,000 Units by mouth every evening.    [provider]  ?clopidogrel (PLAVIX) 75 MG tablet Take 1 tablet (75 mg total) by mouth daily. ?Patient taking differently: Take 75 mg by mouth every evening. 10/23/15   Bonnielee Haff, MD  ?diphenhydrAMINE (BENADRYL) 25 mg capsule Take 25 mg by mouth every 6 (six) hours as needed for allergies.    [provider]  ?escitalopram (LEXAPRO) 10 MG tablet Take 10 mg by mouth at bedtime. 04/28/21   [provider]  ?famotidine (PEPCID) 20 MG tablet Take 20 mg by mouth daily as needed for heartburn or indigestion.    [provider]  ?fluticasone (FLONASE) 50 MCG/ACT nasal spray Place 1 spray into both nostrils daily as needed for allergies or rhinitis.    [provider]  ?KLOR-CON M20 20  MEQ tablet Take 20 mEq by mouth every evening. 06/20/21   [provider]  ?levETIRAcetam (KEPPRA) 500 MG tablet Take 1 tablet (500 mg total) by mouth 2 (two) times daily. 04/08/22   Garald Balding, PA-C  ?Lidocaine HCl (ASPERCREME LIDOCAINE) 4 % LIQD Apply 1 application topically as directed.    [provider]  ?loperamide (IMODIUM A-D) 2 MG tablet Take 2 mg by mouth 4 (four) times daily as needed for diarrhea or loose stools.    [provider]  ?Multiple Vitamin (MULTIVITAMIN) tablet Take 1  tablet by mouth every evening.    [provider]  ?omeprazole (PRILOSEC) 20 MG capsule Take 1 capsule by mouth daily.    [provider]  ?valsartan-hydrochlorothiazide (DIOVAN-HCT) 160-25 MG tablet Take 1 tablet by mouth every evening.    [provider]  ?vitamin B-12 (CYANOCOBALAMIN) 100 MCG tablet Take 100 mcg by mouth every evening.    [provider]  ?   ? ?Allergies    ?Nexium [esomeprazole], Codeine, Lisinopril, Lorazepam, Losartan potassium-hctz, Omeprazole, Paroxetine, Sertraline hcl, and Zofran [ondansetron]   ? ?Review of Systems   ?Review of Systems  ?Unable to perform ROS: Mental status change  ? ?Physical Exam ?Updated Vital Signs ?BP (!) 141/57   Pulse 85   Temp 99.1 ?F (37.3 ?C)   Resp 18   SpO2 100%  ?Physical Exam ?Vitals and nursing note reviewed.  ?Constitutional:   ?   General: She is not in acute distress. ?   Appearance: Normal appearance. She is well-developed. She is ill-appearing. She is not diaphoretic.  ?   Comments: Patient is somewhat somnolent but easily awakens to verbal stimuli, she is ill-appearing, but not in acute distress  ?HENT:  ?   Head: Normocephalic and atraumatic.  ?   Mouth/Throat:  ?   Mouth: Mucous membranes are moist.  ?   Pharynx: Oropharynx is clear.  ?Eyes:  ?   General:     ?   Right eye: No discharge.     ?   Left eye: No discharge.  ?   Extraocular Movements: Extraocular movements intact.  ?   Pupils: Pupils are equal, round, and reactive to light.  ?Cardiovascular:  ?   Rate and Rhythm: Regular rhythm. Bradycardia present.  ?   Pulses: Normal pulses.  ?   Heart sounds: Normal heart sounds.  ?   Comments: Patient with intermittent bradycardia into the 40s and 50s in the room ?Pulmonary:  ?   Effort: Pulmonary effort is normal. No respiratory distress.  ?   Breath sounds: Normal breath sounds. No wheezing or rales.  ?   Comments: Respirations equal and unlabored, satting well on room air, she has some decreased air  movement but no focal wheezes, rales or rhonchi noted ?Abdominal:  ?   General: Bowel sounds are normal. There is no distension.  ?   Palpations: Abdomen is soft. There is no mass.  ?   Tenderness: There is abdominal tenderness. There is guarding.  ?   Comments: Abdomen is soft, nondistended, bowel sounds present, patient reports generalized tenderness to palpation in all quadrants but patient does have some guarding with palpation in the right lower quadrant.  ?Musculoskeletal:     ?   General: No deformity.  ?   Cervical back: Neck supple.  ?Skin: ?   General: Skin is warm and dry.  ?   Capillary Refill: Capillary refill takes less than 2 seconds.  ?Neurological:  ?  Mental Status: She is oriented to person, place, and time.  ?   Coordination: Coordination normal.  ?   Comments: Patient is alert and oriented but speech is intermittently garbled or nonsensical and pt has difficulty answering questions, she is able to follow all commands ?CN III-XII intact ?Normal strength in upper and lower extremities bilaterally including dorsiflexion and plantar flexion, strong and equal grip strength ?Sensation normal to light and sharp touch ?Moves extremities without ataxia, coordination intact  ?Psychiatric:     ?   Mood and Affect: Mood normal.     ?   Behavior: Behavior normal.  ? ? ?ED Results / Procedures / Treatments   ?Labs ?(all labs ordered are listed, but only abnormal results are displayed) ?Labs Reviewed  ?COMPREHENSIVE METABOLIC PANEL - Abnormal; Notable for the following components:  ?    Result Value  ? Potassium 3.2 (*)   ? Glucose, Bld 124 (*)   ? Albumin 3.4 (*)   ? Total Bilirubin 1.9 (*)   ? All other components within normal limits  ?CBC WITH DIFFERENTIAL/PLATELET - Abnormal; Notable for the following components:  ? WBC 20.3 (*)   ? Neutro Abs 16.0 (*)   ? Monocytes Absolute 1.9 (*)   ? Abs Immature Granulocytes 0.11 (*)   ? All other components within normal limits  ?CBG MONITORING, ED - Abnormal;  Notable for the following components:  ? Glucose-Capillary 119 (*)   ? All other components within normal limits  ?I-STAT VENOUS BLOOD GAS, ED - Abnormal; Notable for the following components:  ? pO2, Ven 26 (*)   ? Bi

## 2022-04-08 NOTE — Progress Notes (Signed)
EEG complete - results pending 

## 2022-04-08 NOTE — Procedures (Signed)
Patient Name: Jordan Lane  ?MRN: [id number]  ?Epilepsy Attending: Lora Havens  ?Referring Physician/Provider: Donnetta Simpers, MD ?Date: 04/08/2022 ?Duration: 22.53 mins ? ?Patient history:  76 y.o. female with PMH significant for hypertension, GERD, hyperlipidemia, prior episode of aphasia that resolved and concerning for a potential TIA vs aborted stroke from tNKASE who presents with recurrence of an episode of aphasia. EEG to evaluate for seizure. ? ?Level of alertness: Awake, asleep ? ?AEDs during EEG study: LEV ? ?Technical aspects: This EEG study was done with scalp electrodes positioned according to the 10-20 International system of electrode placement. Electrical activity was acquired at a sampling rate of '500Hz'$  and reviewed with a high frequency filter of '70Hz'$  and a low frequency filter of '1Hz'$ . EEG data were recorded continuously and digitally stored.  ? ?Description: The posterior dominant rhythm consists of 7.5 Hz activity of moderate voltage (25-35 uV) seen predominantly in posterior head regions, symmetric and reactive to eye opening and eye closing. Sleep was characterized by vertex waves, sleep spindles (12 to 14 Hz), maximal frontocentral region.  Hyperventilation and photic stimulation were not performed.    ? ?IMPRESSION: ?This study is within normal limits. No seizures or epileptiform discharges were seen throughout the recording. ? ?Lora Havens  ? ?

## 2022-04-08 NOTE — ED Triage Notes (Signed)
Pt arrived with [facility] for AMS. Lkw noon. Family noted pt to be weak and lethargic. Able to stand with assistance. Pt was seen yesterday and treated for UTI  and started on Keppra per chart review. EMS gave 1L NS enroute. Initial Bp 82/60, improved after fluids. PT A&Ox3 ?

## 2022-04-08 NOTE — Telephone Encounter (Signed)
EEG results from recent admission no evidence of seizures but based on presenting symptoms of aphasia and confusion with similar episode last Fall, neurology during hospital evaluation felt episode possibly in setting of seizures therefore initiated Keppra.  Recommend continuation of Keppra for seizure prevention. Can schedule a f/u visit for June/July. Thank you.  ?

## 2022-04-08 NOTE — Telephone Encounter (Signed)
It appears pt went to ED yesterday and had EEG completed. Per report from Dr. Hortense Ramal IMPRESSION: ?This study is within normal limits. No seizures or epileptiform discharges were seen throughout the recording. Will fwd to NP for review.  ?

## 2022-04-08 NOTE — Code Documentation (Signed)
Code stroke called at 2123 for AMS. LKW 1900. On arrival to ED bay, NIH stroke scale performed by neurologist Bartholome Bill- NIH 0. MD verbally cancelled code stroke. POC per MD note- Seizure workup with MRI and EEG.  ?

## 2022-04-09 ENCOUNTER — Encounter (HOSPITAL_COMMUNITY): Payer: Self-pay | Admitting: Internal Medicine

## 2022-04-09 DIAGNOSIS — Z950 Presence of cardiac pacemaker: Secondary | ICD-10-CM | POA: Diagnosis not present

## 2022-04-09 DIAGNOSIS — Z888 Allergy status to other drugs, medicaments and biological substances status: Secondary | ICD-10-CM | POA: Diagnosis not present

## 2022-04-09 DIAGNOSIS — F419 Anxiety disorder, unspecified: Secondary | ICD-10-CM | POA: Diagnosis present

## 2022-04-09 DIAGNOSIS — I10 Essential (primary) hypertension: Secondary | ICD-10-CM | POA: Diagnosis present

## 2022-04-09 DIAGNOSIS — Z1152 Encounter for screening for COVID-19: Secondary | ICD-10-CM | POA: Diagnosis not present

## 2022-04-09 DIAGNOSIS — K219 Gastro-esophageal reflux disease without esophagitis: Secondary | ICD-10-CM | POA: Diagnosis present

## 2022-04-09 DIAGNOSIS — E876 Hypokalemia: Secondary | ICD-10-CM | POA: Diagnosis not present

## 2022-04-09 DIAGNOSIS — R001 Bradycardia, unspecified: Secondary | ICD-10-CM | POA: Diagnosis present

## 2022-04-09 DIAGNOSIS — Z885 Allergy status to narcotic agent status: Secondary | ICD-10-CM | POA: Diagnosis not present

## 2022-04-09 DIAGNOSIS — R4781 Slurred speech: Secondary | ICD-10-CM | POA: Diagnosis present

## 2022-04-09 DIAGNOSIS — G9341 Metabolic encephalopathy: Secondary | ICD-10-CM | POA: Diagnosis present

## 2022-04-09 DIAGNOSIS — I639 Cerebral infarction, unspecified: Secondary | ICD-10-CM | POA: Diagnosis not present

## 2022-04-09 DIAGNOSIS — R4701 Aphasia: Secondary | ICD-10-CM | POA: Diagnosis present

## 2022-04-09 DIAGNOSIS — Z9049 Acquired absence of other specified parts of digestive tract: Secondary | ICD-10-CM | POA: Diagnosis not present

## 2022-04-09 DIAGNOSIS — N390 Urinary tract infection, site not specified: Secondary | ICD-10-CM | POA: Diagnosis present

## 2022-04-09 DIAGNOSIS — E669 Obesity, unspecified: Secondary | ICD-10-CM | POA: Diagnosis present

## 2022-04-09 DIAGNOSIS — I441 Atrioventricular block, second degree: Secondary | ICD-10-CM

## 2022-04-09 DIAGNOSIS — R338 Other retention of urine: Secondary | ICD-10-CM | POA: Diagnosis not present

## 2022-04-09 DIAGNOSIS — K5732 Diverticulitis of large intestine without perforation or abscess without bleeding: Secondary | ICD-10-CM | POA: Diagnosis present

## 2022-04-09 DIAGNOSIS — R630 Anorexia: Secondary | ICD-10-CM | POA: Diagnosis present

## 2022-04-09 DIAGNOSIS — R451 Restlessness and agitation: Secondary | ICD-10-CM | POA: Diagnosis not present

## 2022-04-09 DIAGNOSIS — I63012 Cerebral infarction due to thrombosis of left vertebral artery: Secondary | ICD-10-CM | POA: Diagnosis not present

## 2022-04-09 DIAGNOSIS — G9340 Encephalopathy, unspecified: Secondary | ICD-10-CM | POA: Diagnosis not present

## 2022-04-09 DIAGNOSIS — I442 Atrioventricular block, complete: Secondary | ICD-10-CM | POA: Diagnosis not present

## 2022-04-09 DIAGNOSIS — E785 Hyperlipidemia, unspecified: Secondary | ICD-10-CM | POA: Diagnosis not present

## 2022-04-09 DIAGNOSIS — A419 Sepsis, unspecified organism: Secondary | ICD-10-CM | POA: Diagnosis not present

## 2022-04-09 DIAGNOSIS — R569 Unspecified convulsions: Secondary | ICD-10-CM | POA: Diagnosis present

## 2022-04-09 DIAGNOSIS — R4182 Altered mental status, unspecified: Secondary | ICD-10-CM | POA: Diagnosis present

## 2022-04-09 DIAGNOSIS — I959 Hypotension, unspecified: Secondary | ICD-10-CM | POA: Diagnosis present

## 2022-04-09 LAB — URINALYSIS, ROUTINE W REFLEX MICROSCOPIC
Bilirubin Urine: NEGATIVE
Glucose, UA: NEGATIVE mg/dL
Hgb urine dipstick: NEGATIVE
Ketones, ur: 5 mg/dL — AB
Leukocytes,Ua: NEGATIVE
Nitrite: NEGATIVE
Protein, ur: NEGATIVE mg/dL
Specific Gravity, Urine: >1.046 — ABNORMAL HIGH (ref 1.005–1.030)
pH: 5 (ref 5.0–8.0)

## 2022-04-09 LAB — BASIC METABOLIC PANEL
Anion gap: 9 (ref 5–15)
BUN: 8 mg/dL (ref 8–23)
CO2: 23 mmol/L (ref 22–32)
Calcium: 8.5 mg/dL — ABNORMAL LOW (ref 8.9–10.3)
Chloride: 108 mmol/L (ref 98–111)
Creatinine, Ser: 0.76 mg/dL (ref 0.44–1.00)
GFR, Estimated: >60 mL/min (ref >60)
Glucose, Bld: 115 mg/dL — ABNORMAL HIGH (ref 70–99)
Potassium: 3.3 mmol/L — ABNORMAL LOW (ref 3.5–5.1)
Sodium: 140 mmol/L (ref 135–145)

## 2022-04-09 LAB — CUP PACEART REMOTE DEVICE CHECK
Date Time Interrogation Session: 20230509213323
Implantable Pulse Generator Implant Date: 20230406

## 2022-04-09 LAB — AMMONIA: Ammonia: 20 umol/L (ref 9–35)

## 2022-04-09 LAB — URINE CULTURE: Culture: 10000 — AB

## 2022-04-09 LAB — LACTIC ACID, PLASMA: Lactic Acid, Venous: 1.4 mmol/L (ref 0.5–1.9)

## 2022-04-09 MED ORDER — ATORVASTATIN CALCIUM 10 MG PO TABS
20.0000 mg | ORAL_TABLET | Freq: Every evening | ORAL | Status: DC
Start: 2022-04-09 — End: 2022-04-18
  Administered 2022-04-09 – 2022-04-17 (×8): 20 mg via ORAL
  Filled 2022-04-09 (×9): qty 2

## 2022-04-09 MED ORDER — PANTOPRAZOLE SODIUM 40 MG PO TBEC
40.0000 mg | DELAYED_RELEASE_TABLET | Freq: Every day | ORAL | Status: DC
  Administered 2022-04-09 – 2022-04-18 (×9): 40 mg via ORAL
  Filled 2022-04-09 (×10): qty 1

## 2022-04-09 MED ORDER — ACETAMINOPHEN 500 MG PO TABS
1000.0000 mg | ORAL_TABLET | Freq: Once | ORAL | Status: AC
  Administered 2022-04-09: 1000 mg via ORAL
  Filled 2022-04-09: qty 2

## 2022-04-09 MED ORDER — PIPERACILLIN-TAZOBACTAM 3.375 G IVPB 30 MIN
3.3750 g | Freq: Once | INTRAVENOUS | Status: AC
  Administered 2022-04-09: 3.375 g via INTRAVENOUS
  Filled 2022-04-09: qty 50

## 2022-04-09 MED ORDER — CLOPIDOGREL BISULFATE 75 MG PO TABS
75.0000 mg | ORAL_TABLET | Freq: Every evening | ORAL | Status: DC
  Administered 2022-04-09 – 2022-04-15 (×6): 75 mg via ORAL
  Filled 2022-04-09 (×7): qty 1

## 2022-04-09 MED ORDER — POTASSIUM CHLORIDE CRYS ER 20 MEQ PO TBCR
40.0000 meq | EXTENDED_RELEASE_TABLET | Freq: Once | ORAL | Status: AC
  Administered 2022-04-09: 40 meq via ORAL
  Filled 2022-04-09: qty 2

## 2022-04-09 MED ORDER — LACTATED RINGERS IV SOLN: INTRAVENOUS | Status: AC

## 2022-04-09 MED ORDER — VALSARTAN-HYDROCHLOROTHIAZIDE 160-25 MG PO TABS: 1.0000 | ORAL_TABLET | Freq: Every evening | ORAL | Status: DC

## 2022-04-09 MED ORDER — DIPHENHYDRAMINE HCL 25 MG PO CAPS: 25.0000 mg | ORAL_CAPSULE | Freq: Four times a day (QID) | ORAL | Status: DC | PRN

## 2022-04-09 MED ORDER — ESCITALOPRAM OXALATE 10 MG PO TABS
20.0000 mg | ORAL_TABLET | Freq: Every day | ORAL | Status: DC
  Administered 2022-04-09 – 2022-04-18 (×9): 20 mg via ORAL
  Filled 2022-04-09 (×10): qty 2

## 2022-04-09 MED ORDER — SENNA 8.6 MG PO TABS
1.0000 | ORAL_TABLET | Freq: Two times a day (BID) | ORAL | Status: DC
  Administered 2022-04-09 – 2022-04-15 (×5): 8.6 mg via ORAL
  Filled 2022-04-09 (×8): qty 1

## 2022-04-09 MED ORDER — HYDROCHLOROTHIAZIDE 25 MG PO TABS
25.0000 mg | ORAL_TABLET | Freq: Every evening | ORAL | Status: DC
  Administered 2022-04-09: 25 mg via ORAL
  Filled 2022-04-09: qty 1

## 2022-04-09 MED ORDER — IRBESARTAN 150 MG PO TABS
150.0000 mg | ORAL_TABLET | Freq: Every evening | ORAL | Status: DC
  Administered 2022-04-09: 150 mg via ORAL
  Filled 2022-04-09 (×2): qty 1

## 2022-04-09 MED ORDER — PIPERACILLIN-TAZOBACTAM 3.375 G IVPB 30 MIN: 3.3750 g | Freq: Three times a day (TID) | INTRAVENOUS | Status: DC

## 2022-04-09 MED ORDER — KETOROLAC TROMETHAMINE 15 MG/ML IJ SOLN
15.0000 mg | Freq: Four times a day (QID) | INTRAMUSCULAR | Status: AC | PRN
  Administered 2022-04-09 – 2022-04-13 (×8): 15 mg via INTRAVENOUS
  Filled 2022-04-09 (×9): qty 1

## 2022-04-09 MED ORDER — ACETAMINOPHEN 650 MG RE SUPP: 650.0000 mg | Freq: Four times a day (QID) | RECTAL | Status: DC | PRN

## 2022-04-09 MED ORDER — PIPERACILLIN-TAZOBACTAM 3.375 G IVPB
3.3750 g | Freq: Three times a day (TID) | INTRAVENOUS | Status: DC
  Administered 2022-04-09 – 2022-04-18 (×27): 3.375 g via INTRAVENOUS
  Filled 2022-04-09 (×34): qty 50

## 2022-04-09 MED ORDER — ACETAMINOPHEN 325 MG PO TABS
650.0000 mg | ORAL_TABLET | Freq: Four times a day (QID) | ORAL | Status: DC | PRN
  Administered 2022-04-10 – 2022-04-16 (×11): 650 mg via ORAL
  Filled 2022-04-09 (×15): qty 2

## 2022-04-09 MED ORDER — ENOXAPARIN SODIUM 40 MG/0.4ML IJ SOSY
40.0000 mg | PREFILLED_SYRINGE | INTRAMUSCULAR | Status: DC
  Administered 2022-04-09 – 2022-04-12 (×4): 40 mg via SUBCUTANEOUS
  Filled 2022-04-09 (×5): qty 0.4

## 2022-04-09 MED ORDER — FLUTICASONE PROPIONATE 50 MCG/ACT NA SUSP
1.0000 | Freq: Every day | NASAL | Status: DC | PRN
  Administered 2022-04-12 – 2022-04-14 (×3): 1 via NASAL
  Filled 2022-04-09: qty 16

## 2022-04-09 MED ORDER — POTASSIUM CHLORIDE CRYS ER 20 MEQ PO TBCR
20.0000 meq | EXTENDED_RELEASE_TABLET | Freq: Every evening | ORAL | Status: DC
  Administered 2022-04-09: 20 meq via ORAL
  Filled 2022-04-09: qty 1

## 2022-04-09 MED ORDER — SODIUM CHLORIDE 0.45 % IV SOLN: INTRAVENOUS | Status: AC

## 2022-04-09 MED ORDER — FENTANYL CITRATE PF 50 MCG/ML IJ SOSY
50.0000 ug | PREFILLED_SYRINGE | Freq: Once | INTRAMUSCULAR | Status: DC
  Filled 2022-04-09: qty 1

## 2022-04-09 MED ORDER — FENTANYL CITRATE PF 50 MCG/ML IJ SOSY
50.0000 ug | PREFILLED_SYRINGE | Freq: Once | INTRAMUSCULAR | Status: AC
  Administered 2022-04-09: 50 ug via INTRAVENOUS
  Filled 2022-04-09: qty 1

## 2022-04-09 MED ORDER — LACTATED RINGERS IV BOLUS (SEPSIS)
250.0000 mL | Freq: Once | INTRAVENOUS | Status: AC
Start: 2022-04-09 — End: 2022-04-09
  Administered 2022-04-09: 250 mL via INTRAVENOUS

## 2022-04-09 MED ORDER — ALPRAZOLAM 0.25 MG PO TABS
0.2500 mg | ORAL_TABLET | Freq: Every evening | ORAL | Status: DC | PRN
  Filled 2022-04-09: qty 1

## 2022-04-09 NOTE — Assessment & Plan Note (Signed)
BP appears stable. ? ?Plan  Continue home meds-ARB + diuretic ?

## 2022-04-09 NOTE — H&P (Signed)
?History and Physical  ? ? ?Jordan Lane LGX:[id number] DOB: 04/04/1946 DOA: 04/08/2022 ? ?DOS: the patient was seen and examined on 04/08/2022 ? ?PCP: Lavone Orn, MD  ? ?Patient coming from: Home ? ?I have personally briefly reviewed patient's old medical records in [place] ? ?Jordan Lane is a 76 y.o. female with a history of hypertension, stroke, GERD, IBS, degenerative joint disease, who presents to the emergency department via EMS for evaluation of altered mental status.  She is accompanied by her husband and daughters to help to provide history.  Last known well time was noon.   ? ?Patient was seen in the emergency department 04/07/22 as a code stroke,  MRI negative for stroke and EEG without seizure activity. Code stroke cancelled. U/A was positive and she was started on Keflex.  Family reports when she went home she seemed to be doing okay but throughout the afternoon became increasingly altered and weak.  They report she had nonsensical speech intermittently and wanted to sleep throughout the whole day which is atypical for her.  They report later in the day she was so weak that she could not get up without assistance.  Due to her symptoms EMS was activated. Initial eval reveal BP 82/60 and 1 L IVF bolus given.  ? ?Daughters at bedside reports that her health has been declining over the past few months but that this has been a more acute change.  Patient has been complaining of pain all over throughout the day, she has difficulty providing history or more detail regarding pain.  When asked about pain in the chest, abdomen or back she just answers yes to all questions but cannot provide any further details.  Intermittently has nonsensical speech when responding to questions.  ? ?ED Course: Tmax 100.7  HR 39-85, holding at 40 during exam, RR 15, WBC 20.3 w/ 79/11/9. CT A/P -acute descending colon diverticulitis w/o abscess. Zosyn started. TRH called to admit for continued treatment and  evaluation of bradycardia and decline.  ? ?Review of Systems:  ?Review of Systems  ?Constitutional:  Positive for chills, fever and malaise/fatigue.  ?HENT: Negative.    ?Eyes: Negative.   ?Respiratory: Negative.    ?Cardiovascular:  Negative for chest pain and palpitations.  ?Gastrointestinal:  Positive for abdominal pain and nausea.  ?Genitourinary: Negative.   ?Musculoskeletal: Negative.   ?Skin: Negative.   ?Neurological:  Positive for weakness.  ?Psychiatric/Behavioral: Negative.    ? ?Past Medical History:  ?Diagnosis Date  ? Arthritis   ? Chronic insomnia   ? Diverticular disease of left colon   ? colonic AVMs  ? DJD (degenerative joint disease)   ? knees, toe, right hip  ? Fibroadenoma of breast 2011  ? Right  ? Fractured shoulder 2018  ? right  ? GERD (gastroesophageal reflux disease)   ? H/O colonoscopy   ? tubular adenoma  ? History of blood transfusion 52 yrs ago  ? Hypertension   ? IBS (irritable bowel syndrome)   ? Impaired fasting glucose   ? Left wrist fracture 06/2009  ? Lower GI bleed 2010  ? Osteopenia 2007  ? DEXA scan 1.7 hip  ? Plantar fasciitis of right foot   ? Positive colorectal cancer screening using Cologuard test 11/2018  ? Positive PPD 1960  ? not treated  ? Postcholecystectomy diarrhea   ? Reflux   ? Seasonal allergic rhinitis   ? Shingles 2017  ? R V1  ? Stroke [facility]) 10/2015  ? ? ?  Past Surgical History:  ?Procedure Laterality Date  ? ABDOMINAL HYSTERECTOMY  1984  ? TAH, partial  ? BREAST BIOPSY Right 2011  ? CATARACT EXTRACTION, BILATERAL Bilateral 2021  ? CHOLECYSTECTOMY  2009  ? ESOPHAGOGASTRODUODENOSCOPY  11/14/2012  ? Procedure: ESOPHAGOGASTRODUODENOSCOPY (EGD);  Surgeon: Wonda Horner, MD;  Location: [facility] ENDOSCOPY;  Service: Endoscopy;  Laterality: N/A;  ? ESOPHAGOGASTRODUODENOSCOPY (EGD) WITH PROPOFOL N/A 09/21/2016  ? Procedure: ESOPHAGOGASTRODUODENOSCOPY (EGD) WITH PROPOFOL;  Surgeon: Garlan Fair, MD;  Location: [facility] ENDOSCOPY;  Service: Endoscopy;  Laterality: N/A;  ?  ESOPHAGOGASTRODUODENOSCOPY (EGD) WITH PROPOFOL N/A 12/17/2020  ? Procedure: ESOPHAGOGASTRODUODENOSCOPY (EGD) WITH PROPOFOL;  Surgeon: Ronald Lobo, MD;  Location: [facility] ENDOSCOPY;  Service: Endoscopy;  Laterality: N/A;  ? Herniated disc  1987  ? lower back   ? KNEE SURGERY  left 2010, right 2013  ? Arthroscopic  ? LUMBAR LAMINECTOMY Left   ? PARTIAL KNEE ARTHROPLASTY  06/14/2012  ? Procedure: UNICOMPARTMENTAL KNEE;  Surgeon: Mauri Pole, MD;  Location: [facility] ORS;  Service: Orthopedics;  Laterality: Left;  Left Medial Unicompartmental Knee  ? PARTIAL KNEE ARTHROPLASTY Right 11/27/2013  ? Procedure: UNICOMPARTMENTAL RIGHT KNEE, Steroid injection in right great toe;  Surgeon: Mauri Pole, MD;  Location: [facility] ORS;  Service: Orthopedics;  Laterality: Right;  ? ? ?Soc Hx - married 90 years. Had 3 daughters, two are living. Lives with spouse. Has been IADLs until more recent decline. Daughter involved in her care ? ? reports that she has never smoked. She has never used smokeless tobacco. She reports that she does not drink alcohol and does not use drugs. ? ?Allergies  ?Allergen Reactions  ? Nexium [Esomeprazole] Diarrhea  ? Codeine Nausea And Vomiting  ? Lisinopril Cough  ? Lorazepam   ?  Other reaction(s): felt funny  ? Losartan Potassium-Hctz Other (See Comments)  ?  Other reaction(s): dizzy  ? Omeprazole Diarrhea  ?  Other reaction(s): diarrhea  ? Paroxetine Nausea Only  ? Sertraline Hcl Nausea Only  ? Zofran [Ondansetron]   ? ? ?Family History  ?Problem Relation Age of Onset  ? Hypertension Mother   ? Other Mother   ?     Brain tumor  ? Osteoporosis Mother   ? Asthma Mother   ? Coronary artery disease Mother   ? Hypertension Father   ? Diabetes Father   ? Cancer - Lung Father   ? Colon polyps Father   ? Anemia Father   ? Osteoporosis Sister   ? Colon polyps Sister   ? Glaucoma Sister   ? Hyperthyroidism Sister   ? Rheum arthritis Sister   ? Breast cancer Paternal Aunt   ?     Age 61's  ? Breast cancer Paternal  Grandmother   ?     Age 2  ? ? ?Prior to Admission medications   ?Medication Sig Start Date End Date Taking? Authorizing Provider  ?ALPRAZolam (XANAX) 0.25 MG tablet Take 0.25 mg by mouth at bedtime as needed for anxiety or sleep. 09/13/15  Yes [provider]  ?atorvastatin (LIPITOR) 20 MG tablet Take 1 tablet (20 mg total) by mouth daily at 6 PM. ?Patient taking differently: Take 20 mg by mouth every evening. 10/23/15  Yes Bonnielee Haff, MD  ?cholecalciferol (VITAMIN D) 1000 UNITS tablet Take 1,000 Units by mouth every evening.   Yes [provider]  ?clopidogrel (PLAVIX) 75 MG tablet Take 1 tablet (75 mg total) by mouth daily. ?Patient taking differently: Take 75 mg by mouth every  evening. 10/23/15  Yes Bonnielee Haff, MD  ?diphenhydrAMINE (BENADRYL) 25 mg capsule Take 25 mg by mouth every 6 (six) hours as needed for allergies.   Yes [provider]  ?escitalopram (LEXAPRO) 20 MG tablet Take 20 mg by mouth daily. 03/27/22  Yes [provider]  ?famotidine (PEPCID) 20 MG tablet Take 20 mg by mouth daily as needed for heartburn or indigestion.   Yes [provider]  ?fluticasone (FLONASE) 50 MCG/ACT nasal spray Place 1 spray into both nostrils daily as needed for allergies or rhinitis.   Yes [provider]  ?KLOR-CON M20 20 MEQ tablet Take 20 mEq by mouth every evening. 06/20/21  Yes [provider]  ?Lidocaine HCl (ASPERCREME LIDOCAINE) 4 % LIQD Apply 1 application. topically daily as needed (pain).   Yes [provider]  ?loperamide (IMODIUM A-D) 2 MG tablet Take 2 mg by mouth 4 (four) times daily as needed for diarrhea or loose stools.   Yes [provider]  ?Multiple Vitamin (MULTIVITAMIN) tablet Take 1 tablet by mouth every evening.   Yes [provider]  ?omeprazole (PRILOSEC) 20 MG capsule Take 20 mg by mouth daily.   Yes [provider]  ?valsartan-hydrochlorothiazide (DIOVAN-HCT) 160-25 MG tablet Take 1  tablet by mouth every evening.   Yes [provider]  ?vitamin B-12 (CYANOCOBALAMIN) 100 MCG tablet Take 100 mcg by mouth every evening.   Yes [provider]  ?cephALEXin (KEFLEX) 500 MG capsule Take

## 2022-04-09 NOTE — Progress Notes (Signed)
Sepsis tracking by eLINK 

## 2022-04-09 NOTE — Assessment & Plan Note (Signed)
Patient with CVA 2016. TIA/CVA Oct '22 with aphasia, right anopia - resolved with TNKase. Negative eval for occult a. Fib. No evidence of new stroke at ED evaluation 04/07/22 with negative MRI brain, nl EEG.  ? ?Plan Risk factor modification ? Continue home meds. ?

## 2022-04-09 NOTE — Consult Note (Addendum)
?Cardiology Consultation:  ? ?Patient ID: Jordan Lane ?MRN: [id number]; DOB: [birth year] ? ?Admit date: 04/08/2022 ?Date of Consult: 04/09/2022 ? ?PCP:  Lavone Orn, MD ?  ?[facility] Providers ?Cardiologist:  Dr. Johnsie Cancel ?EP: Dr. Curt Bears  ? ? ?Patient Profile:  ? ?Jordan Lane is a 76 y.o. female with a hx of HTN, GERD, arthritis, HLD, stroke (2016, ? TIAOct 2022) who is being seen 04/09/2022 for the evaluation of bradycardia at the request of Dr. Linda Hedges. ? ?History of Present Illness:  ? ?Ms. Kluttz last saw Dr. Curt Bears 03/05/22, and underwent loop implant fpr AFib surveillance in setting of recurrent strokes, ?TIA. ? ?She came to the ER 04/07/22 when family noted she appeared confused, altered, perhaps slurred speech, neurology consulted and on their arrival symptoms had resolved, with similar presentation Oct 2022 that r/o stroke, sudden on and quick resolution of symptoms there is some leaning towards epileptic aphasia, planned for Keppra if MRI negative  ? ?MRI noted: ?IMPRESSION:  ?1. No acute finding.  No focal cortical abnormality.  ?2. Extensive chronic small vessel ischemia.  ?3. Motion artifact.  ?She was note to have UTI ?Started on ABX and Keppra and discharged from the ER. ? ?She was brought back by family yesterday (04/08/22) evening initially patient was at her baseline, doing OK then developed progressive weakness, again with some unusual speech pattern, and unusually sleepy, as well as some complaints of nonspecific pain ?Her ER exam noted abdominal guarding and tenderness, also observed intermittent nonsensical or garbled speech ?Febrile  (100.7), and admitted with CT findings of acute diverticulitis and concerns of sepsis, started on abx and IVF ? ?IM team consulted [place] for bradycardia ? ?LABS ?K+ 3.2 > 3.2 > 3.3 ?BUN/Creat 8/0.76 ?Lactic acid 1.2, 1.4 ?WBC 20.3 ?H/H 13.9/40.0 ?Plts 298 ? ?Loop eval 04/07/22 with no events of any kind ? ?The patient is AAO x3, husband and sister at bedside.   Confusion is improving, not felt to be quite at her usual baseline, but better then at home. ?The patient currently denies any concerns, She can tell me who she is, her Birth date and where she is, not entirely clear on why she is here. ?Currently denies any belly pain or pain of any kind ? ? ?Past Medical History:  ?Diagnosis Date  ? Arthritis   ? Chronic insomnia   ? Diverticular disease of left colon   ? colonic AVMs  ? DJD (degenerative joint disease)   ? knees, toe, right hip  ? Fibroadenoma of breast 2011  ? Right  ? Fractured shoulder 2018  ? right  ? GERD (gastroesophageal reflux disease)   ? H/O colonoscopy   ? tubular adenoma  ? History of blood transfusion 52 yrs ago  ? Hypertension   ? IBS (irritable bowel syndrome)   ? Impaired fasting glucose   ? Left wrist fracture 06/2009  ? Lower GI bleed 2010  ? Osteopenia 2007  ? DEXA scan 1.7 hip  ? Plantar fasciitis of right foot   ? Positive colorectal cancer screening using Cologuard test 11/2018  ? Positive PPD 1960  ? not treated  ? Postcholecystectomy diarrhea   ? Reflux   ? Seasonal allergic rhinitis   ? Shingles 2017  ? R V1  ? Stroke [facility]) 10/2015  ? ? ?Past Surgical History:  ?Procedure Laterality Date  ? ABDOMINAL HYSTERECTOMY  1984  ? TAH, partial  ? BREAST BIOPSY Right 2011  ? CATARACT EXTRACTION, BILATERAL Bilateral 2021  ? CHOLECYSTECTOMY  2009  ? ESOPHAGOGASTRODUODENOSCOPY  11/14/2012  ? Procedure: ESOPHAGOGASTRODUODENOSCOPY (EGD);  Surgeon: Wonda Horner, MD;  Location: [facility] ENDOSCOPY;  Service: Endoscopy;  Laterality: N/A;  ? ESOPHAGOGASTRODUODENOSCOPY (EGD) WITH PROPOFOL N/A 09/21/2016  ? Procedure: ESOPHAGOGASTRODUODENOSCOPY (EGD) WITH PROPOFOL;  Surgeon: Garlan Fair, MD;  Location: [facility] ENDOSCOPY;  Service: Endoscopy;  Laterality: N/A;  ? ESOPHAGOGASTRODUODENOSCOPY (EGD) WITH PROPOFOL N/A 12/17/2020  ? Procedure: ESOPHAGOGASTRODUODENOSCOPY (EGD) WITH PROPOFOL;  Surgeon: Ronald Lobo, MD;  Location: [facility] ENDOSCOPY;  Service: Endoscopy;   Laterality: N/A;  ? Herniated disc  1987  ? lower back   ? KNEE SURGERY  left 2010, right 2013  ? Arthroscopic  ? LUMBAR LAMINECTOMY Left   ? PARTIAL KNEE ARTHROPLASTY  06/14/2012  ? Procedure: UNICOMPARTMENTAL KNEE;  Surgeon: Mauri Pole, MD;  Location: [facility] ORS;  Service: Orthopedics;  Laterality: Left;  Left Medial Unicompartmental Knee  ? PARTIAL KNEE ARTHROPLASTY Right 11/27/2013  ? Procedure: UNICOMPARTMENTAL RIGHT KNEE, Steroid injection in right great toe;  Surgeon: Mauri Pole, MD;  Location: [facility] ORS;  Service: Orthopedics;  Laterality: Right;  ?  ? ?Home Medications:  ?Prior to Admission medications   ?Medication Sig Start Date End Date Taking? Authorizing Provider  ?ALPRAZolam (XANAX) 0.25 MG tablet Take 0.25 mg by mouth at bedtime as needed for anxiety or sleep. 09/13/15  Yes [provider]  ?atorvastatin (LIPITOR) 20 MG tablet Take 1 tablet (20 mg total) by mouth daily at 6 PM. ?Patient taking differently: Take 20 mg by mouth every evening. 10/23/15  Yes Bonnielee Haff, MD  ?cholecalciferol (VITAMIN D) 1000 UNITS tablet Take 1,000 Units by mouth every evening.   Yes [provider]  ?clopidogrel (PLAVIX) 75 MG tablet Take 1 tablet (75 mg total) by mouth daily. ?Patient taking differently: Take 75 mg by mouth every evening. 10/23/15  Yes Bonnielee Haff, MD  ?diphenhydrAMINE (BENADRYL) 25 mg capsule Take 25 mg by mouth every 6 (six) hours as needed for allergies.   Yes [provider]  ?escitalopram (LEXAPRO) 20 MG tablet Take 20 mg by mouth daily. 03/27/22  Yes [provider]  ?famotidine (PEPCID) 20 MG tablet Take 20 mg by mouth daily as needed for heartburn or indigestion.   Yes [provider]  ?fluticasone (FLONASE) 50 MCG/ACT nasal spray Place 1 spray into both nostrils daily as needed for allergies or rhinitis.   Yes [provider]  ?KLOR-CON M20 20 MEQ tablet Take 20 mEq by mouth every evening. 06/20/21  Yes [provider]   ?Lidocaine HCl (ASPERCREME LIDOCAINE) 4 % LIQD Apply 1 application. topically daily as needed (pain).   Yes [provider]  ?loperamide (IMODIUM A-D) 2 MG tablet Take 2 mg by mouth 4 (four) times daily as needed for diarrhea or loose stools.   Yes [provider]  ?Multiple Vitamin (MULTIVITAMIN) tablet Take 1 tablet by mouth every evening.   Yes [provider]  ?omeprazole (PRILOSEC) 20 MG capsule Take 20 mg by mouth daily.   Yes [provider]  ?valsartan-hydrochlorothiazide (DIOVAN-HCT) 160-25 MG tablet Take 1 tablet by mouth every evening.   Yes [provider]  ?vitamin B-12 (CYANOCOBALAMIN) 100 MCG tablet Take 100 mcg by mouth every evening.   Yes [provider]  ?cephALEXin (KEFLEX) 500 MG capsule Take 1 capsule (500 mg total) by mouth 2 (two) times daily for 7 days. ?Patient taking differently: Take 500 mg by mouth See admin instructions. Bid x 7 days 04/08/22 04/15/22  Garald Balding, PA-C  ?  levETIRAcetam (KEPPRA) 500 MG tablet Take 1 tablet (500 mg total) by mouth 2 (two) times daily. 04/08/22   Garald Balding, PA-C  ? ? ?Inpatient Medications: ?Scheduled Meds: ? atorvastatin  20 mg Oral QPM  ? clopidogrel  75 mg Oral QPM  ? enoxaparin (LOVENOX) injection  40 mg Subcutaneous Q24H  ? escitalopram  20 mg Oral Daily  ? fentaNYL (SUBLIMAZE) injection  50 mcg Intravenous Once  ? irbesartan  150 mg Oral QPM  ? And  ? hydrochlorothiazide  25 mg Oral QPM  ? pantoprazole  40 mg Oral Daily  ? potassium chloride SA  20 mEq Oral QPM  ? senna  1 tablet Oral BID  ? ?Continuous Infusions: ? sodium chloride 75 mL/hr at 04/09/22 0511  ? lactated ringers 150 mL/hr at 04/09/22 0730  ? piperacillin-tazobactam (ZOSYN)  IV Stopped (04/09/22 0941)  ? ?PRN Meds: ?acetaminophen **OR** acetaminophen, ALPRAZolam, diphenhydrAMINE, fluticasone, ketorolac ? ?Allergies:    ?Allergies  ?Allergen Reactions  ? Nexium [Esomeprazole] Diarrhea  ? Codeine Nausea And Vomiting  ?  Lisinopril Cough  ? Lorazepam   ?  Other reaction(s): felt funny  ? Losartan Potassium-Hctz Other (See Comments)  ?  Other reaction(s): dizzy  ? Omeprazole Diarrhea  ?  Other reaction(s): diarrhea  ? Paroxetine Nausea

## 2022-04-09 NOTE — Progress Notes (Signed)
No charge note ? ?Patient seen and examined this morning, admitted overnight. ? ?76 year old female with HTN, CVA, GERD, IBS, who comes into the hospital with complaints of altered mental status.  She was seen in the ED for altered mental status on 04/07/2022 as a code stroke.  Neurology consulted at that time.  An MRI was negative for CVA and EEG was without seizure activity, and she was sent home on Keflex for presumed UTI.  She continued to be off of her baseline, was more confused today was brought back.  She was febrile and hypotensive today, also found to be bradycardic.  CT scan of the abdomen pelvis showed acute diverticulitis.  She was placed on antibiotics and admitted to the hospital. ? ? ?Principal problem ?Acute diverticulitis-evident on the CT scan of the abdomen and pelvis.  She is febrile with it.  Does not complain of much abdominal pain but reported that she had some the other day.  Placed on Zosyn, continue.  Keep on clear liquid diet for now ? ?Active problems ?Prior CVA-continue statin, Plavix.  She was just worked up couple of days in the ED and was negative for an acute CVA. ? ?Essential hypertension-continue home medications as below ? ?Bradycardia-somewhat surprising given lack of response to her fever and acute infection.  She has been evaluated by cardiology as an outpatient due to her history of cryptogenic strokes.  Cardiology consulted again since she is persistently bradycardic ? ?Hyperlipidemia-continue statin ? ?Hypokalemia-repeat BMP this morning ? ?Leukocytosis-due to acute diverticulitis ? ?Scheduled Meds: ? atorvastatin  20 mg Oral QPM  ? clopidogrel  75 mg Oral QPM  ? enoxaparin (LOVENOX) injection  40 mg Subcutaneous Q24H  ? escitalopram  20 mg Oral Daily  ? fentaNYL (SUBLIMAZE) injection  50 mcg Intravenous Once  ? irbesartan  150 mg Oral QPM  ? And  ? hydrochlorothiazide  25 mg Oral QPM  ? pantoprazole  40 mg Oral Daily  ? potassium chloride SA  20 mEq Oral QPM  ? senna  1  tablet Oral BID  ? ?Continuous Infusions: ? sodium chloride 75 mL/hr at 04/09/22 0511  ? lactated ringers 150 mL/hr at 04/09/22 0730  ? piperacillin-tazobactam (ZOSYN)  IV Stopped (04/09/22 0941)  ? ?PRN Meds:.acetaminophen **OR** acetaminophen, ALPRAZolam, diphenhydrAMINE, fluticasone, ketorolac ? ?Roan Sawchuk M. Cruzita Lederer, MD, PhD ?[facility] ? ?Between 7 am - 7 pm you can contact me via Amion (for emergencies) or Securechat (non urgent matters).  ?I am not available 7 pm - 7 am, please contact night coverage MD/APP via Amion ?

## 2022-04-09 NOTE — Telephone Encounter (Signed)
I called pt's daughter back and left vm ( ok per dpr) advising of NP's message/recommendation. ? ?She was advised to call back if she had questions and to call back to move the f/u appt to June or July.  ? ?

## 2022-04-09 NOTE — H&P (View-Only) (Signed)
?Cardiology Consultation:  ? ?Patient ID: [facility] Lane ?MRN: [id number]; DOB: 10/10/1946 ? ?Admit date: 04/08/2022 ?Date of Consult: 04/09/2022 ? ?PCP:  Lavone Orn, MD ?  ?[facility] Providers ?Cardiologist:  Dr. Johnsie Cancel ?EP: Dr. Curt Bears  ? ? ?Patient Profile:  ? ?[facility] Lane is a 76 y.o. female with a hx of HTN, GERD, arthritis, HLD, stroke (2016, ? TIAOct 2022) who is being seen 04/09/2022 for the evaluation of bradycardia at the request of Dr. Linda Hedges. ? ?History of Present Illness:  ? ?Jordan Lane last saw Dr. Curt Bears 03/05/22, and underwent loop implant fpr AFib surveillance in setting of recurrent strokes, ?TIA. ? ?She came to the ER 04/07/22 when family noted she appeared confused, altered, perhaps slurred speech, neurology consulted and on their arrival symptoms had resolved, with similar presentation Oct 2022 that r/o stroke, sudden on and quick resolution of symptoms there is some leaning towards epileptic aphasia, planned for Keppra if MRI negative  ? ?MRI noted: ?IMPRESSION:  ?1. No acute finding.  No focal cortical abnormality.  ?2. Extensive chronic small vessel ischemia.  ?3. Motion artifact.  ?She was note to have UTI ?Started on ABX and Keppra and discharged from the ER. ? ?She was brought back by family yesterday (04/08/22) evening initially patient was at her baseline, doing OK then developed progressive weakness, again with some unusual speech pattern, and unusually sleepy, as well as some complaints of nonspecific pain ?Her ER exam noted abdominal guarding and tenderness, also observed intermittent nonsensical or garbled speech ?Febrile  (100.7), and admitted with CT findings of acute diverticulitis and concerns of sepsis, started on abx and IVF ? ?IM team consulted [place] for bradycardia ? ?LABS ?K+ 3.2 > 3.2 > 3.3 ?BUN/Creat 8/0.76 ?Lactic acid 1.2, 1.4 ?WBC 20.3 ?H/H 13.9/40.0 ?Plts 298 ? ?Loop eval 04/07/22 with no events of any kind ? ?The patient is AAO x3, husband and sister at bedside.   Confusion is improving, not felt to be quite at her usual baseline, but better then at home. ?The patient currently denies any concerns, She can tell me who she is, her Birth date and where she is, not entirely clear on why she is here. ?Currently denies any belly pain or pain of any kind ? ? ?Past Medical History:  ?Diagnosis Date  ? Arthritis   ? Chronic insomnia   ? Diverticular disease of left colon   ? colonic AVMs  ? DJD (degenerative joint disease)   ? knees, toe, right hip  ? Fibroadenoma of breast 2011  ? Right  ? Fractured shoulder 2018  ? right  ? GERD (gastroesophageal reflux disease)   ? H/O colonoscopy   ? tubular adenoma  ? History of blood transfusion 52 yrs ago  ? Hypertension   ? IBS (irritable bowel syndrome)   ? Impaired fasting glucose   ? Left wrist fracture 06/2009  ? Lower GI bleed 2010  ? Osteopenia 2007  ? DEXA scan 1.7 hip  ? Plantar fasciitis of right foot   ? Positive colorectal cancer screening using Cologuard test 11/2018  ? Positive PPD 1960  ? not treated  ? Postcholecystectomy diarrhea   ? Reflux   ? Seasonal allergic rhinitis   ? Shingles 2017  ? R V1  ? Stroke [facility]) 10/2015  ? ? ?Past Surgical History:  ?Procedure Laterality Date  ? ABDOMINAL HYSTERECTOMY  1984  ? TAH, partial  ? BREAST BIOPSY Right 2011  ? CATARACT EXTRACTION, BILATERAL Bilateral 2021  ? CHOLECYSTECTOMY  2009  ? ESOPHAGOGASTRODUODENOSCOPY  11/14/2012  ? Procedure: ESOPHAGOGASTRODUODENOSCOPY (EGD);  Surgeon: Wonda Horner, MD;  Location: [facility];  Service: Endoscopy;  Laterality: N/A;  ? ESOPHAGOGASTRODUODENOSCOPY (EGD) WITH PROPOFOL N/A 09/21/2016  ? Procedure: ESOPHAGOGASTRODUODENOSCOPY (EGD) WITH PROPOFOL;  Surgeon: Garlan Fair, MD;  Location: [facility];  Service: Endoscopy;  Laterality: N/A;  ? ESOPHAGOGASTRODUODENOSCOPY (EGD) WITH PROPOFOL N/A 12/17/2020  ? Procedure: ESOPHAGOGASTRODUODENOSCOPY (EGD) WITH PROPOFOL;  Surgeon: Ronald Lobo, MD;  Location: [facility];  Service: Endoscopy;   Laterality: N/A;  ? Herniated disc  1987  ? lower back   ? KNEE SURGERY  left 2010, right 2013  ? Arthroscopic  ? LUMBAR LAMINECTOMY Left   ? PARTIAL KNEE ARTHROPLASTY  06/14/2012  ? Procedure: UNICOMPARTMENTAL KNEE;  Surgeon: Mauri Pole, MD;  Location: [facility];  Service: Orthopedics;  Laterality: Left;  Left Medial Unicompartmental Knee  ? PARTIAL KNEE ARTHROPLASTY Right 11/27/2013  ? Procedure: UNICOMPARTMENTAL RIGHT KNEE, Steroid injection in right great toe;  Surgeon: Mauri Pole, MD;  Location: [facility];  Service: Orthopedics;  Laterality: Right;  ?  ? ?Home Medications:  ?Prior to Admission medications   ?Medication Sig Start Date End Date Taking? Authorizing Provider  ?ALPRAZolam (XANAX) 0.25 MG tablet Take 0.25 mg by mouth at bedtime as needed for anxiety or sleep. 09/13/15  Yes [provider]  ?atorvastatin (LIPITOR) 20 MG tablet Take 1 tablet (20 mg total) by mouth daily at 6 PM. ?Patient taking differently: Take 20 mg by mouth every evening. 10/23/15  Yes Bonnielee Haff, MD  ?cholecalciferol (VITAMIN D) 1000 UNITS tablet Take 1,000 Units by mouth every evening.   Yes [provider]  ?clopidogrel (PLAVIX) 75 MG tablet Take 1 tablet (75 mg total) by mouth daily. ?Patient taking differently: Take 75 mg by mouth every evening. 10/23/15  Yes Bonnielee Haff, MD  ?diphenhydrAMINE (BENADRYL) 25 mg capsule Take 25 mg by mouth every 6 (six) hours as needed for allergies.   Yes [provider]  ?escitalopram (LEXAPRO) 20 MG tablet Take 20 mg by mouth daily. 03/27/22  Yes [provider]  ?famotidine (PEPCID) 20 MG tablet Take 20 mg by mouth daily as needed for heartburn or indigestion.   Yes [provider]  ?fluticasone (FLONASE) 50 MCG/ACT nasal spray Place 1 spray into both nostrils daily as needed for allergies or rhinitis.   Yes [provider]  ?KLOR-CON M20 20 MEQ tablet Take 20 mEq by mouth every evening. 06/20/21  Yes [provider]   ?Lidocaine HCl (ASPERCREME LIDOCAINE) 4 % LIQD Apply 1 application. topically daily as needed (pain).   Yes [provider]  ?loperamide (IMODIUM A-D) 2 MG tablet Take 2 mg by mouth 4 (four) times daily as needed for diarrhea or loose stools.   Yes [provider]  ?Multiple Vitamin (MULTIVITAMIN) tablet Take 1 tablet by mouth every evening.   Yes [provider]  ?omeprazole (PRILOSEC) 20 MG capsule Take 20 mg by mouth daily.   Yes [provider]  ?valsartan-hydrochlorothiazide (DIOVAN-HCT) 160-25 MG tablet Take 1 tablet by mouth every evening.   Yes [provider]  ?vitamin B-12 (CYANOCOBALAMIN) 100 MCG tablet Take 100 mcg by mouth every evening.   Yes [provider]  ?cephALEXin (KEFLEX) 500 MG capsule Take 1 capsule (500 mg total) by mouth 2 (two) times daily for 7 days. ?Patient taking differently: Take 500 mg by mouth See admin instructions. Bid x 7 days 04/08/22 04/15/22  Garald Balding, PA-C  ?  levETIRAcetam (KEPPRA) 500 MG tablet Take 1 tablet (500 mg total) by mouth 2 (two) times daily. 04/08/22   Garald Balding, PA-C  ? ? ?Inpatient Medications: ?Scheduled Meds: ? atorvastatin  20 mg Oral QPM  ? clopidogrel  75 mg Oral QPM  ? enoxaparin (LOVENOX) injection  40 mg Subcutaneous Q24H  ? escitalopram  20 mg Oral Daily  ? fentaNYL (SUBLIMAZE) injection  50 mcg Intravenous Once  ? irbesartan  150 mg Oral QPM  ? And  ? hydrochlorothiazide  25 mg Oral QPM  ? pantoprazole  40 mg Oral Daily  ? potassium chloride SA  20 mEq Oral QPM  ? senna  1 tablet Oral BID  ? ?Continuous Infusions: ? sodium chloride 75 mL/hr at 04/09/22 0511  ? lactated ringers 150 mL/hr at 04/09/22 0730  ? piperacillin-tazobactam (ZOSYN)  IV Stopped (04/09/22 0941)  ? ?PRN Meds: ?acetaminophen **OR** acetaminophen, ALPRAZolam, diphenhydrAMINE, fluticasone, ketorolac ? ?Allergies:    ?Allergies  ?Allergen Reactions  ? Nexium [Esomeprazole] Diarrhea  ? Codeine Nausea And Vomiting  ?  Lisinopril Cough  ? Lorazepam   ?  Other reaction(s): felt funny  ? Losartan Potassium-Hctz Other (See Comments)  ?  Other reaction(s): dizzy  ? Omeprazole Diarrhea  ?  Other reaction(s): diarrhea  ? Paroxetine Nausea

## 2022-04-09 NOTE — Assessment & Plan Note (Signed)
Febrile, leukocytosis, tender on exam LLQ, CT reveals acute diverticulitis ? ?Plan Zosyn q 8 ? IV fluids ? Zofran prn nausea ?

## 2022-04-09 NOTE — Subjective & Objective (Signed)
[facility] is a 76 y.o. female with a history of hypertension, stroke, GERD, IBS, degenerative joint disease, who presents to the emergency department via EMS for evaluation of altered mental status.  She is accompanied by her husband and daughters to help to provide history.  Last known well time was noon.   ? ?Patient was seen in the emergency department 04/07/22 as a code stroke,  MRI negative for stroke and EEG without seizure activity. Code stroke cancelled. U/A was positive and she was started on Keflex.  Family reports when she went home she seemed to be doing okay but throughout the afternoon became increasingly altered and weak.  They report she had nonsensical speech intermittently and wanted to sleep throughout the whole day which is atypical for her.  They report later in the day she was so weak that she could not get up without assistance.  Due to her symptoms EMS was activated. Initial eval reveal BP 82/60 and 1 L IVF bolus given.  ? ?Daughters at bedside reports that her health has been declining over the past few months but that this has been a more acute change.  Patient has been complaining of pain all over throughout the day, she has difficulty providing history or more detail regarding pain.  When asked about pain in the chest, abdomen or back she just answers yes to all questions but cannot provide any further details.  Intermittently has nonsensical speech when responding to questions. ?

## 2022-04-09 NOTE — Progress Notes (Signed)
?  Transition of Care (TOC) Screening Note ? ? ?Patient Details  ?Name: [facility] ?Date of Birth: 01/12/1946 ? ? ?Transition of Care (TOC) CM/SW Contact:    ?Milas Gain, LCSWA ?Phone Number: ?04/09/2022, 4:23 PM ? ? ? ?Transition of Care Department [facility]) has reviewed patient and no TOC needs have been identified at this time. We will continue to monitor patient advancement through interdisciplinary progression rounds. If new patient transition needs arise, please place a TOC consult. ?  ?

## 2022-04-09 NOTE — Assessment & Plan Note (Signed)
-   Continue home meds °

## 2022-04-09 NOTE — Assessment & Plan Note (Signed)
Patient with h/o tachyrythmia. She has had recent eval for occult a. Fib as cause of TIA/CVA with implantable recorder that was negative. Now with sinus bradycardia. BP stable. Has seen Dr. Nishan-cardiology and Dr. Curt Bears -EP. Question of SSS? Reviewed all meds for potential cause ? ?Plan Tele admit ? Cardiology consult - secure chat to on-call Fellow. ?

## 2022-04-09 NOTE — Progress Notes (Signed)
Pt admitted to [place], VS wnL and as per flow. SB 40s on telemetry. Pt oriented to 6E processes. Pt familiar with the [facility] system. All questions and concerns addressed. Call bell placed within reach, will continue to monitor and maintain safety. Husband and sister bedside. ?

## 2022-04-10 ENCOUNTER — Encounter (HOSPITAL_COMMUNITY)
Admission: EM | Disposition: A | Discharge status: Home / Self Care | Payer: Self-pay | Source: Home / Self Care | Attending: Internal Medicine

## 2022-04-10 ENCOUNTER — Encounter (HOSPITAL_COMMUNITY): Payer: Self-pay | Admitting: Internal Medicine

## 2022-04-10 DIAGNOSIS — I442 Atrioventricular block, complete: Secondary | ICD-10-CM | POA: Diagnosis not present

## 2022-04-10 DIAGNOSIS — G9341 Metabolic encephalopathy: Secondary | ICD-10-CM | POA: Diagnosis not present

## 2022-04-10 DIAGNOSIS — G9340 Encephalopathy, unspecified: Secondary | ICD-10-CM

## 2022-04-10 DIAGNOSIS — I10 Essential (primary) hypertension: Secondary | ICD-10-CM | POA: Diagnosis not present

## 2022-04-10 DIAGNOSIS — K5732 Diverticulitis of large intestine without perforation or abscess without bleeding: Secondary | ICD-10-CM | POA: Diagnosis not present

## 2022-04-10 HISTORY — PX: TEMPORARY PACEMAKER: CATH118268

## 2022-04-10 LAB — URINE CULTURE: Culture: NO GROWTH

## 2022-04-10 LAB — COMPREHENSIVE METABOLIC PANEL
ALT: 13 U/L (ref 0–44)
AST: 20 U/L (ref 15–41)
Albumin: 2.8 g/dL — ABNORMAL LOW (ref 3.5–5.0)
Alkaline Phosphatase: 52 U/L (ref 38–126)
Anion gap: 10 (ref 5–15)
BUN: 8 mg/dL (ref 8–23)
CO2: 22 mmol/L (ref 22–32)
Calcium: 8.3 mg/dL — ABNORMAL LOW (ref 8.9–10.3)
Chloride: 106 mmol/L (ref 98–111)
Creatinine, Ser: 0.96 mg/dL (ref 0.44–1.00)
GFR, Estimated: >60 mL/min (ref >60)
Glucose, Bld: 105 mg/dL — ABNORMAL HIGH (ref 70–99)
Potassium: 3.4 mmol/L — ABNORMAL LOW (ref 3.5–5.1)
Sodium: 138 mmol/L (ref 135–145)
Total Bilirubin: 2.3 mg/dL — ABNORMAL HIGH (ref 0.3–1.2)
Total Protein: 5.5 g/dL — ABNORMAL LOW (ref 6.5–8.1)

## 2022-04-10 LAB — CBC
HCT: 31.3 % — ABNORMAL LOW (ref 36.0–46.0)
Hemoglobin: 10.6 g/dL — ABNORMAL LOW (ref 12.0–15.0)
MCH: 31.3 pg (ref 26.0–34.0)
MCHC: 33.9 g/dL (ref 30.0–36.0)
MCV: 92.3 fL (ref 80.0–100.0)
Platelets: 236 10*3/uL (ref 150–400)
RBC: 3.39 MIL/uL — ABNORMAL LOW (ref 3.87–5.11)
RDW: 12.5 % (ref 11.5–15.5)
WBC: 15 10*3/uL — ABNORMAL HIGH (ref 4.0–10.5)
nRBC: 0 % (ref 0.0–0.2)

## 2022-04-10 LAB — MAGNESIUM: Magnesium: 1.3 mg/dL — ABNORMAL LOW (ref 1.7–2.4)

## 2022-04-10 SURGERY — TEMPORARY PACEMAKER
Anesthesia: LOCAL

## 2022-04-10 MED ORDER — MIDAZOLAM HCL 2 MG/2ML IJ SOLN
1.0000 mg | INTRAMUSCULAR | Status: DC | PRN
  Administered 2022-04-10: 2 mg via INTRAVENOUS
  Filled 2022-04-10: qty 2

## 2022-04-10 MED ORDER — LORAZEPAM 2 MG/ML IJ SOLN
1.0000 mg | INTRAMUSCULAR | Status: DC | PRN
  Administered 2022-04-10 – 2022-04-12 (×2): 1 mg via INTRAVENOUS
  Filled 2022-04-10: qty 1

## 2022-04-10 MED ORDER — LORAZEPAM 2 MG/ML IJ SOLN: 0.5000 mg | Freq: Once | INTRAMUSCULAR | Status: DC | PRN

## 2022-04-10 MED ORDER — DOPAMINE-DEXTROSE 3.2-5 MG/ML-% IV SOLN
0.0000 ug/kg/min | INTRAVENOUS | Status: DC
  Administered 2022-04-10: 5 ug/kg/min via INTRAVENOUS

## 2022-04-10 MED ORDER — CHLORHEXIDINE GLUCONATE CLOTH 2 % EX PADS
6.0000 | MEDICATED_PAD | Freq: Every day | CUTANEOUS | Status: DC
  Administered 2022-04-10 – 2022-04-17 (×8): 6 via TOPICAL

## 2022-04-10 MED ORDER — ATROPINE SULFATE 1 MG/10ML IJ SOSY
PREFILLED_SYRINGE | INTRAMUSCULAR | Status: AC
  Administered 2022-04-10: 0.5 mg via INTRAVENOUS
  Filled 2022-04-10: qty 10

## 2022-04-10 MED ORDER — SODIUM CHLORIDE 0.9 % IV SOLN: INTRAVENOUS | Status: DC

## 2022-04-10 MED ORDER — ATROPINE SULFATE 1 MG/10ML IJ SOSY: 0.5000 mg | PREFILLED_SYRINGE | Freq: Once | INTRAMUSCULAR | Status: AC

## 2022-04-10 MED ORDER — QUETIAPINE FUMARATE 25 MG PO TABS
25.0000 mg | ORAL_TABLET | Freq: Every day | ORAL | Status: DC
  Administered 2022-04-10: 25 mg via ORAL
  Filled 2022-04-10 (×2): qty 1

## 2022-04-10 MED ORDER — SODIUM CHLORIDE 0.9 % IV BOLUS
500.0000 mL | Freq: Once | INTRAVENOUS | Status: AC
Start: 2022-04-10 — End: 2022-04-10
  Administered 2022-04-10: 500 mL via INTRAVENOUS

## 2022-04-10 MED ORDER — LORAZEPAM 2 MG/ML IJ SOLN
INTRAMUSCULAR | Status: AC
  Filled 2022-04-10: qty 1

## 2022-04-10 MED ORDER — MAGNESIUM SULFATE 2 GM/50ML IV SOLN: 2.0000 g | Freq: Once | INTRAVENOUS | Status: AC

## 2022-04-10 MED ORDER — MAGNESIUM SULFATE 2 GM/50ML IV SOLN
INTRAVENOUS | Status: AC
  Administered 2022-04-10: 2 g
  Filled 2022-04-10: qty 50

## 2022-04-10 MED ORDER — HALOPERIDOL LACTATE 5 MG/ML IJ SOLN: 5.0000 mg | Freq: Four times a day (QID) | INTRAMUSCULAR | Status: DC | PRN

## 2022-04-10 MED ORDER — MIDAZOLAM HCL 2 MG/2ML IJ SOLN
INTRAMUSCULAR | Status: DC | PRN
Start: 2022-04-10 — End: 2022-04-10

## 2022-04-10 MED ORDER — MIDAZOLAM HCL 2 MG/2ML IJ SOLN
INTRAMUSCULAR | Status: AC
  Filled 2022-04-10: qty 2

## 2022-04-10 MED ORDER — POTASSIUM CHLORIDE CRYS ER 20 MEQ PO TBCR
40.0000 meq | EXTENDED_RELEASE_TABLET | Freq: Once | ORAL | Status: DC
  Filled 2022-04-10: qty 2

## 2022-04-10 MED ORDER — LORAZEPAM 2 MG/ML IJ SOLN
0.2500 mg | Freq: Once | INTRAMUSCULAR | Status: DC | PRN
Start: 2022-04-10 — End: 2022-04-10

## 2022-04-10 MED ORDER — HEPARIN (PORCINE) IN NACL 1000-0.9 UT/500ML-% IV SOLN
INTRAVENOUS | Status: DC | PRN
Start: 2022-04-10 — End: 2022-04-10
  Administered 2022-04-10: 500 mL

## 2022-04-10 MED ORDER — ATROPINE SULFATE 1 MG/10ML IJ SOSY
PREFILLED_SYRINGE | INTRAMUSCULAR | Status: AC
  Administered 2022-04-10: 1 mg via INTRAVENOUS
  Filled 2022-04-10: qty 10

## 2022-04-10 MED ORDER — SODIUM CHLORIDE 0.9 % IV SOLN
INTRAVENOUS | Status: AC | PRN
  Administered 2022-04-10: 10 mL/h via INTRAVENOUS

## 2022-04-10 MED ORDER — MAGNESIUM SULFATE 2 GM/50ML IV SOLN
2.0000 g | Freq: Once | INTRAVENOUS | Status: AC
  Filled 2022-04-10: qty 50

## 2022-04-10 MED ORDER — LIDOCAINE HCL (PF) 1 % IJ SOLN
INTRAMUSCULAR | Status: AC
  Filled 2022-04-10: qty 30

## 2022-04-10 MED ORDER — HEPARIN (PORCINE) IN NACL 1000-0.9 UT/500ML-% IV SOLN
INTRAVENOUS | Status: AC
  Filled 2022-04-10: qty 500

## 2022-04-10 MED ORDER — SODIUM CHLORIDE 0.9 % IV SOLN: INTRAVENOUS | Status: DC | PRN

## 2022-04-10 MED ORDER — LIDOCAINE HCL (PF) 1 % IJ SOLN
INTRAMUSCULAR | Status: DC | PRN
  Administered 2022-04-10: 10 mL

## 2022-04-10 SURGICAL SUPPLY — 11 items
CABLE ADAPT PACING TEMP 12FT (ADAPTER) IMPLANT
CABLE SURGICAL S-101-97-12 (CABLE) IMPLANT
KIT MICROPUNCTURE NIT STIFF (SHEATH) ×1 IMPLANT
PACK CARDIAC CATHETERIZATION (CUSTOM PROCEDURE TRAY) ×1 IMPLANT
SHEATH PINNACLE 6F 10CM (SHEATH) ×1 IMPLANT
SHEATH PROBE COVER 6X72 (BAG) ×1 IMPLANT
SLEEVE REPOSITIONING LENGTH 30 (MISCELLANEOUS) ×1 IMPLANT
TRANSDUCER W/STOPCOCK (MISCELLANEOUS) ×1 IMPLANT
TUBING ART PRESS 72  MALE/FEM (TUBING) ×2
TUBING ART PRESS 72 MALE/FEM (TUBING) IMPLANT
WIRE PACING TEMP ST TIP 5 (CATHETERS) ×1 IMPLANT

## 2022-04-10 NOTE — Progress Notes (Signed)
Pt c/o midsternal CP, and her daughter states pt was SOB prior to my arrival to room.  VSS except for HR of 37.  O2 via [place] applied @ 2L.  IV Toradol given per PRN order.  Pt then assisted to bathroom to void, and she did report mild dizziness upon standing.  Assisted back to bed from bathroom without difficulty.  Pt appears more pale and lethargic, skin warm and moist.  C/o feeling "hot."  NT notified to obtain EKG.  Jodell Cipro ? ?

## 2022-04-10 NOTE — Progress Notes (Signed)
Overnight progress note ? ?Notified by RN that patient complained of chest pain and dizziness this morning.  EKG was done and concerning for complete heart block, new LBBB.  Patient's heart rate in the 30s.  Systolic was previously in the 130s but blood pressure now dropping with most recent 102/50.  RN had already notified on-call cardiologist.  I have also notified cardiology team and patient will be evaluated immediately for transcutaneous pacing (spoke to Dr. Katharina Caper and cardiology APP Eulas Post).  Also labs done this morning showing magnesium 1.3, potassium 3.4.  Magnesium and potassium replacement ordered.  500 cc IV fluid bolus ordered.  Not on any AV nodal blocking agents. ?

## 2022-04-10 NOTE — Progress Notes (Signed)
Pt transferred to [facility] via bed with a Zoll and without complications.  Accompanied by this RN and Anselm Pancoast, Rapid Response RN.  Pt's belongings sent with her husband & daughter who were escorted to the waiting area by nursing staff. Jodell Cipro ? ?

## 2022-04-10 NOTE — Consult Note (Signed)
? ?NAME:  [facility], MRN:  [id number], DOB:  Feb 18, 1946, LOS: 1 ?ADMISSION DATE:  04/08/2022, CONSULTATION DATE:  04/10/22 ?REFERRING MD:  Renne Crigler, CHIEF COMPLAINT:  agitation  ? ?History of Present Illness:  ?76yF with HTN, CVA, GERD, IBS who presented with confusion 04/07/22, had unrevealing EEG and stroke workup. Was sent home on keflex for UTI, keppra for possible seizure activity on neuro's recommendation, but then re-presented from home with altered mental status, found also to be hypotensive, bradycardic. Workup concerning for sepsis due to acute diverticulitis , started on zosyn with course complicated by 2:1 AV block, given atropine x2, started on dopamine and then CHB. Tempwire placed 5/12. ? ?She has intermittently been agitated. Last night given fentanyl 50 mcg, this morning ativan '1mg'$  IV, versed 2 mg IV, dose of seroquel 25 mg. Bladder scan this morning showed some urine retention that was challenging to measure but I/O cath yielded 750cc and provided some relief of pt's agitation evidently. ? ?Pertinent  Medical History  ?CVA ?GERD ?IBS ?HTN ? ?Significant Hospital Events: ?Including procedures, antibiotic start and stop dates in addition to other pertinent events   ?5/9 presented to ED for aphasia/confusion, sent home with keflex for UTI and keppra for possible seizure activity ?5/10 re-presented for worsening confusion, found to have sepsis due to diverticulitis  ?5/11 cardiology consulted for bradycardia ?5/12 CHB, unstable bradycardia started on dopamine, EP consulted, tempwire placed ? ?Interim History / Subjective:  ? ? ?Objective   ?Blood pressure 120/62, pulse 82, temperature 97.9 ?F (36.6 ?C), temperature source Oral, resp. rate (!) 25, SpO2 93 %. ?   ?   ? ?Intake/Output Summary (Last 24 hours) at 04/10/2022 1306 ?Last data filed at 04/10/2022 1222 ?Gross per 24 hour  ?Intake 3474.86 ml  ?Output 750 ml  ?Net 2724.86 ml  ? ?There were no vitals filed for this visit. ? ?Examination: ?General  appearance: 76 y.o., female, NAD, asleep ?Eyes: PERRL ?HENT: NCAT; dry MM ?Lungs: seems have cheyne stokes breathing pattern mixed with obstructive apneas, with normal respiratory effort ?CV: HR ~80 on VVI with tempwire, no murmur  ?Abdomen: Soft, non-tender; non-distended, BS present  ?Extremities: trace peripheral edema, warm ?Skin: Normal turgor and texture; no rash ? ? ?Resolved Hospital Problem list   ? ? ?Assessment & Plan:  ? ?# Acute metabolic encephalopathy ?# Agitation ?May be due combination of sepsis, urinary retention. Is on xanax 0.25 mg at home which she takes most nights but doesn't sound like she necessarily overdoes it.  ?- ABX per primary ?- periodic bladder scans, I/O as needed, can hopefully avoid foley to minimize lines/tubes attached to her in setting of her delirium ?- delirium precautions - lights on during the day, frequent reorientation, glasses/hearing aids if applicable  ?- if we begin to see more of her native conduction would repeat EKG to check QTc ?- she will likely need a low dose of some benzodiazepine daily given her home use to at least prevent withdrawal ?- agree with trial of seroquel if needed from agitation standpoint - she at least seemed to tolerate it this morning without arrhythmia ?- although she has had unstable bradyarrhythmia she does now have tempwire and precedex could be entertained for agitation interfering with her care refractory to above measures but would still be cautious with its use in setting of prolonged QTc.  ? ?Will sign off but glad to be reinvolved as condition changes ? ? ?Best Practice (right click and "Reselect all SmartList Selections" daily)  ? ?  Per primary ? ?Labs   ?CBC: ?Recent Labs  ?Lab 04/07/22 ?2144 04/07/22 ?2147 04/08/22 ?2137 04/08/22 ?2256 04/10/22 ?0301  ?WBC 18.4*  --  20.3*  --  15.0*  ?NEUTROABS 12.9*  --  16.0*  --   --   ?HGB 14.0 14.6 13.9 13.9 10.6*  ?HCT 41.7 43.0 40.0 41.0 31.3*  ?MCV 94.6  --  92.0  --  92.3  ?PLT 265  --   298  --  236  ? ? ?Basic Metabolic Panel: ?Recent Labs  ?Lab 04/07/22 ?2144 04/07/22 ?2147 04/08/22 ?2137 04/08/22 ?2256 04/09/22 ?1011 04/10/22 ?0301  ?NA 138 137 141 140 140 138  ?K 3.5 3.4* 3.2* 3.2* 3.3* 3.4*  ?CL 103 101 104  --  108 106  ?CO2 24  --  29  --  23 22  ?GLUCOSE 109* 110* 124*  --  115* 105*  ?BUN '8 9 10  '$ --  8 8  ?CREATININE 0.84 0.70 0.92  --  0.76 0.96  ?CALCIUM 9.4  --  9.1  --  8.5* 8.3*  ?MG  --   --   --   --   --  1.3*  ? ?GFR: ?Estimated Creatinine Clearance: 44.4 mL/min (by C-G formula based on SCr of 0.96 mg/dL). ?Recent Labs  ?Lab 04/07/22 ?2144 04/08/22 ?2137 04/08/22 ?2310 04/09/22 ?0050 04/10/22 ?0301  ?WBC 18.4* 20.3*  --   --  15.0*  ?LATICACIDVEN  --   --  1.2 1.4  --   ? ? ?Liver Function Tests: ?Recent Labs  ?Lab 04/07/22 ?2144 04/08/22 ?2137 04/10/22 ?0301  ?AST '24 18 20  '$ ?ALT '14 12 13  '$ ?ALKPHOS 67 64 52  ?BILITOT 0.9 1.9* 2.3*  ?PROT 7.2 7.0 5.5*  ?ALBUMIN 3.8 3.4* 2.8*  ? ?No results for input(s): LIPASE, AMYLASE in the last 168 hours. ?Recent Labs  ?Lab 04/08/22 ?2137  ?AMMONIA 20  ? ? ?ABG ?   ?Component Value Date/Time  ? HCO3 28.8 (H) 04/08/2022 2256  ? TCO2 30 04/08/2022 2256  ? O2SAT 48 04/08/2022 2256  ?  ? ?Coagulation Profile: ?Recent Labs  ?Lab 04/07/22 ?2159 04/08/22 ?2137  ?INR 1.1 1.2  ? ? ?Cardiac Enzymes: ?No results for input(s): CKTOTAL, CKMB, CKMBINDEX, TROPONINI in the last 168 hours. ? ?HbA1C: ?Hgb A1c MFr Bld  ?Date/Time Value Ref Range Status  ?09/12/2021 04:03 AM 5.3 4.8 - 5.6 % Final  ?  Comment:  ?  (NOTE) ?Pre diabetes:          5.7%-6.4% ? ?Diabetes:              >6.4% ? ?Glycemic control for   <7.0% ?adults with diabetes ?  ?10/22/2015 06:06 AM 5.9 (H) 4.8 - 5.6 % Final  ?  Comment:  ?  (NOTE) ?        Pre-diabetes: 5.7 - 6.4 ?        Diabetes: >6.4 ?        Glycemic control for adults with diabetes: <7.0 ?  ? ? ?CBG: ?Recent Labs  ?Lab 04/08/22 ?2136  ?GLUCAP 119*  ? ? ?Review of Systems:   ?Unable to obtain in setting of encephalopahty ? ?Past  Medical History:  ?She,  has a past medical history of Arthritis, Chronic insomnia, Diverticular disease of left colon, DJD (degenerative joint disease), Fibroadenoma of breast (2011), Fractured shoulder (2018), GERD (gastroesophageal reflux disease), H/O colonoscopy, History of blood transfusion (52 yrs ago), Hypertension, IBS (irritable bowel syndrome), Impaired fasting glucose, Left wrist  fracture (06/2009), Lower GI bleed (2010), Osteopenia (2007), Plantar fasciitis of right foot, Positive colorectal cancer screening using Cologuard test (11/2018), Positive PPD (1960), Postcholecystectomy diarrhea, Reflux, Seasonal allergic rhinitis, Shingles (2017), and Stroke ([place]) (10/2015).  ? ?Surgical History:  ? ?Past Surgical History:  ?Procedure Laterality Date  ? ABDOMINAL HYSTERECTOMY  1984  ? TAH, partial  ? BREAST BIOPSY Right 2011  ? CATARACT EXTRACTION, BILATERAL Bilateral 2021  ? CHOLECYSTECTOMY  2009  ? ESOPHAGOGASTRODUODENOSCOPY  11/14/2012  ? Procedure: ESOPHAGOGASTRODUODENOSCOPY (EGD);  Surgeon: Wonda Horner, MD;  Location: [facility] ENDOSCOPY;  Service: Endoscopy;  Laterality: N/A;  ? ESOPHAGOGASTRODUODENOSCOPY (EGD) WITH PROPOFOL N/A 09/21/2016  ? Procedure: ESOPHAGOGASTRODUODENOSCOPY (EGD) WITH PROPOFOL;  Surgeon: Garlan Fair, MD;  [facility];  Service: Endoscopy;  Laterality: N/A;  ? ESOPHAGOGASTRODUODENOSCOPY (EGD) WITH PROPOFOL N/A 12/17/2020  ? Procedure: ESOPHAGOGASTRODUODENOSCOPY (EGD) WITH PROPOFOL;  Surgeon: Ronald Lobo, MD;  Location: [facility]OSCOPY;  Service: Endoscopy;  Laterality: N/A;  ? Herniated disc  1987  ? lower back   ? KNEE SURGERY  left 2010, right 2013  ? Arthroscopic  ? LUMBAR LAMINECTOMY Left   ? PARTIAL KNEE ARTHROPLASTY  06/14/2012  ? Procedure: UNICOMPARTMENTAL KNEE;  Surgeon: Mauri Pole, MD;  Location: [facility];  Service: Orthopedics;  Laterality: Left;  Left Medial Unicompartmental Knee  ? PARTIAL KNEE ARTHROPLASTY Right 11/27/2013  ? Procedure: UNICOMPARTMENTAL  RIGHT KNEE, Steroid injection in right great toe;  Surgeon: Mauri Pole, MD;  Location: [facility];  Service: Orthopedics;  Laterality: Right;  ? TEMPORARY PACEMAKER N/A 04/10/2022  ? Procedure: TEMPORARY PA

## 2022-04-10 NOTE — Progress Notes (Addendum)
? ?Progress Note ? ?Patient Name: Jordan Lane ?Date of Encounter: 04/10/2022 ? ?[facility] Cardiologist: Dr. Johnsie Cancel ? ?Subjective  ? ?Nauseous, no CP ? ?Inpatient Medications  ?  ?Scheduled Meds: ? atorvastatin  20 mg Oral QPM  ? Chlorhexidine Gluconate Cloth  6 each Topical Daily  ? clopidogrel  75 mg Oral QPM  ? enoxaparin (LOVENOX) injection  40 mg Subcutaneous Q24H  ? escitalopram  20 mg Oral Daily  ? fentaNYL (SUBLIMAZE) injection  50 mcg Intravenous Once  ? irbesartan  150 mg Oral QPM  ? And  ? hydrochlorothiazide  25 mg Oral QPM  ? pantoprazole  40 mg Oral Daily  ? potassium chloride  40 mEq Oral Once  ? senna  1 tablet Oral BID  ? ?Continuous Infusions: ? sodium chloride    ? DOPamine 5 mcg/kg/min (04/10/22 0654)  ? magnesium sulfate bolus IVPB    ? piperacillin-tazobactam (ZOSYN)  IV 3.375 g (04/09/22 2112)  ? ?PRN Meds: ?acetaminophen **OR** acetaminophen, diphenhydrAMINE, fluticasone, ketorolac  ? ?Vital Signs  ?  ?Vitals:  ? 04/10/22 0620 04/10/22 0625 04/10/22 0630 04/10/22 2993  ?BP: (!) 113/49 (!) 92/54 (!) 126/93   ?Pulse: (!) 37 (!) 37 (!) 42 (!) 37  ?Resp:    14  ?Temp:      ?TempSrc:      ?SpO2: 98% 99% 98% 98%  ? ? ?Intake/Output Summary (Last 24 hours) at 04/10/2022 0721 ?Last data filed at 04/09/2022 1817 ?Gross per 24 hour  ?Intake 3382.01 ml  ?Output --  ?Net 3382.01 ml  ? ? ?  04/07/2022  ?  9:53 PM 03/31/2022  ? 11:02 AM 03/05/2022  ?  9:29 AM  ?Last 3 Weights  ?Weight (lbs) 150 lb 153 lb 159 lb  ?Weight (kg) 68.04 kg 69.4 kg 72.122 kg  ?   ? ?Telemetry  ?  ?CHB high 30's-low 40's - Personally Reviewed ? ?ECG  ?  ?CHB, LBBB 37bpm - Personally Reviewed ? ?Physical Exam  ? ?GEN: nauseous, pale ?Neck: No JVD ?Cardiac: RRR, bradycardic, no murmurs, rubs, or gallops.  ?Respiratory: CTA b/l. ?GI: Soft, nontender, non-distended  ?MS: No edema; No deformity. ?Neuro: she is AAO self, place, (family reports far from hr baseline personality), generalized weakness, no clear focal deficit ?Psych:  cooperative ? ?Labs  ?  ?High Sensitivity Troponin:  No results for input(s): TROPONINIHS in the last 720 hours.   ?Chemistry ?Recent Labs  ?Lab 04/07/22 ?2144 04/07/22 ?2147 04/08/22 ?2137 04/08/22 ?2256 04/09/22 ?1011 04/10/22 ?0301  ?NA 138   < > 141 140 140 138  ?K 3.5   < > 3.2* 3.2* 3.3* 3.4*  ?CL 103   < > 104  --  108 106  ?CO2 24  --  29  --  23 22  ?GLUCOSE 109*   < > 124*  --  115* 105*  ?BUN 8   < > 10  --  8 8  ?CREATININE 0.84   < > 0.92  --  0.76 0.96  ?CALCIUM 9.4  --  9.1  --  8.5* 8.3*  ?MG  --   --   --   --   --  1.3*  ?PROT 7.2  --  7.0  --   --  5.5*  ?ALBUMIN 3.8  --  3.4*  --   --  2.8*  ?AST 24  --  18  --   --  20  ?ALT 14  --  12  --   --  13  ?ALKPHOS 67  --  64  --   --  52  ?BILITOT 0.9  --  1.9*  --   --  2.3*  ?GFRNONAA >60  --  >60  --  >60 >60  ?ANIONGAP 11  --  8  --  9 10  ? < > = values in this interval not displayed.  ?  ?Lipids No results for input(s): CHOL, TRIG, HDL, LABVLDL, LDLCALC, CHOLHDL in the last 168 hours.  ?Hematology ?Recent Labs  ?Lab 04/07/22 ?2144 04/07/22 ?2147 04/08/22 ?2137 04/08/22 ?2256 04/10/22 ?0301  ?WBC 18.4*  --  20.3*  --  15.0*  ?RBC 4.41  --  4.35  --  3.39*  ?HGB 14.0   < > 13.9 13.9 10.6*  ?HCT 41.7   < > 40.0 41.0 31.3*  ?MCV 94.6  --  92.0  --  92.3  ?MCH 31.7  --  32.0  --  31.3  ?MCHC 33.6  --  34.8  --  33.9  ?RDW 12.7  --  12.7  --  12.5  ?PLT 265  --  298  --  236  ? < > = values in this interval not displayed.  ? ?Thyroid No results for input(s): TSH, FREET4 in the last 168 hours.  ?BNPNo results for input(s): BNP, PROBNP in the last 168 hours.  ?DDimer No results for input(s): DDIMER in the last 168 hours.  ? ?Radiology  ?  ?CT Head Wo Contrast ? ?Result Date: 04/08/2022 ?CLINICAL DATA:  Altered mental status. EXAM: CT HEAD WITHOUT CONTRAST TECHNIQUE: Contiguous axial images were obtained from the base of the skull through the vertex without intravenous contrast. RADIATION DOSE REDUCTION: This exam was performed according to the  departmental dose-optimization program which includes automated exposure control, adjustment of the mA and/or kV according to patient size and/or use of iterative reconstruction technique. COMPARISON:  September 11, 2021 FINDINGS: Brain: There is mild cerebral atrophy with widening of the extra-axial spaces and ventricular dilatation. There are areas of decreased attenuation within the white matter tracts of the supratentorial brain, consistent with microvascular disease changes. Vascular: No hyperdense vessel or unexpected calcification. Skull: Normal. Negative for fracture or focal lesion. Sinuses/Orbits: No acute finding. Other: None. IMPRESSION: 1. No acute intracranial abnormality. 2. Generalized cerebral atrophy with chronic white matter small vessel ischemic changes. Electronically Signed   By: Virgina Norfolk M.D.   On: 04/08/2022 23:44  ? ?CT Abdomen Pelvis W Contrast ? ?Result Date: 04/08/2022 ?CLINICAL DATA:  Right lower quadrant pain. EXAM: CT ABDOMEN AND PELVIS WITH CONTRAST TECHNIQUE: Multidetector CT imaging of the abdomen and pelvis was performed using the standard protocol following bolus administration of intravenous contrast. RADIATION DOSE REDUCTION: This exam was performed according to the departmental dose-optimization program which includes automated exposure control, adjustment of the mA and/or kV according to patient size and/or use of iterative reconstruction technique. CONTRAST:  52m OMNIPAQUE IOHEXOL 300 MG/ML  SOLN COMPARISON:  None Available. FINDINGS: Lower chest: No acute abnormality. Hepatobiliary: No focal liver abnormality is seen. Status post cholecystectomy. No biliary dilatation. Pancreas: Unremarkable. No pancreatic ductal dilatation or surrounding inflammatory changes. Spleen: Normal in size without focal abnormality. Adrenals/Urinary Tract: The right adrenal gland is unremarkable. An 8 mm diameter isodense (approximately 113.58 Hounsfield units) left adrenal mass is seen  (axial CT image 23, CT series 3). Kidneys are normal, without renal calculi, focal lesion, or hydronephrosis. Bladder is unremarkable. Stomach/Bowel: Stomach is within normal limits. Appendix appears normal. No evidence  of bowel dilatation. Markedly inflamed diverticula are seen within the distal descending colon. There is no evidence of associated perforation or abscess. Vascular/Lymphatic: Aortic atherosclerosis. No enlarged abdominal or pelvic lymph nodes. Reproductive: The uterus is surgically absent. A 2.9 cm x 1.7 cm simple cyst is seen within the left adnexa. Other: No abdominal wall hernia or abnormality. No abdominopelvic ascites. Musculoskeletal: Multilevel degenerative changes are seen throughout the lumbar spine. IMPRESSION: 1. Acute diverticulitis involving the distal descending colon without evidence of associated perforation or abscess. 2. Evidence of prior cholecystectomy. 3. Left sub-cm adrenal mass, probably benign. No follow-up imaging is recommended. JACR 2017 Aug; 14(8):1038-44, JCAT 2016 Mar-Apr; 40(2):194-200, Urol J 2006 Spring; 3(2):71-4. 4. Left adnexal simple cyst likely ovarian in origin. 5. Aortic atherosclerosis. Aortic Atherosclerosis (ICD10-I70.0). Electronically Signed   By: Virgina Norfolk M.D.   On: 04/08/2022 23:51  ? ?DG Chest Port 1 View ? ?Result Date: 04/08/2022 ?CLINICAL DATA:  Questionable sepsis. EXAM: PORTABLE CHEST 1 VIEW COMPARISON:  November 16, 2013 FINDINGS: The heart size and mediastinal contours are within normal limits. There is moderate severity calcification of the aortic arch. Low lung volumes are noted. Both lungs are clear. Degenerative changes seen throughout the thoracic spine. IMPRESSION: No active disease. Electronically Signed   By: Virgina Norfolk M.D.   On: 04/08/2022 23:08   ? ?Cardiac Studies  ? ?09/12/21: TTE ? 1. Left ventricular ejection fraction, by estimation, is 60 to 65%. The  ?left ventricle has normal function. Left ventricular endocardial  border  ?not optimally defined to evaluate regional wall motion. Left ventricular  ?diastolic parameters are  ?indeterminate.  ? 2. Right ventricular systolic function is normal. The right ventricular  ?size is normal

## 2022-04-10 NOTE — Progress Notes (Signed)
Jordan Lane, Cardiology PA, at bedside.  Orders received and implemented for IV atropine.  Jordan Lane ? ?

## 2022-04-10 NOTE — Progress Notes (Addendum)
EKG shows complete heart block.  Cardiology fellow, Dr. Chalmers Cater, notified via [facility] text page at [place]. ?Rapid response nurse, Anselm Pancoast, notified via phone at [phone]. ?Return call from Dr. Chalmers Cater at [phone].  Zoll pads and monitor placed on pt.  Mindy, rapid response RN, at bedside. ?Hospitalist, Dr. Marlowe Sax, notifed at 0601 of Mag 1.3 and K+ 3.4, and SBP dropped to 90s. Orders received and implemented for electrolyte replacement and IV fluid bolus.  Jodell Cipro ? ?

## 2022-04-10 NOTE — Progress Notes (Signed)
Paged by hospitalist to see this patient urgently due to complete heart block.  On arrival, heart rate was around 36-37 bpm.  Patient was alert and oriented x3 but reportedly had some confusion last night.  She is on antibiotic for diverticulitis.  I gave her half amp of atropine followed by a full amp of atropine.  Heart rate did not change.  She was subsequently placed on dopamine.  Given good mental status, we decided to hold off on transcutaneous pacing.  Patient likely will require temporary pacing wire this morning.  EP service will be involved.  DOD Dr. Acie Fredrickson was notified of the patient's condition.  Husband and daughter at bedside.  During the interview, the patient's blood pressure temporarily dipped down to the 90s however quickly bounced back.  She has been given 500 cc of IV bolus. ?

## 2022-04-10 NOTE — Progress Notes (Signed)
Patient transported to 7O16 with no complications. Hemodynamically stable. Complete heart block in the 30s. BP 145/77. ECG monitor connected. Zoll connected. Starting dopamine per MD order. Patient alert and oriented x3. Disoriented to situation. No chest pain at this time. Daughter and husband at bedside. ?

## 2022-04-10 NOTE — Significant Event (Signed)
Rapid Response Event Note  ? ?Reason for Call :  ?CP/SOB prompting EKG, EKG showing CHB-30. Pt pale, diaphoretic, confused.  ? ?Pt has been bradycardic 40-60 2nd degree HB up until RRT call.  ? ?Initial Focused Assessment:  ?Pt lying in bed with eyes open. She is very confused, pale, diaphoretic. Lungs clear and diminished. ? ?T-97.9, HR-32(CHB), BP-99/55, RR-20, SpO2-95% on 2L [place] ? ? ?Interventions:  ?2L [place] ?Zoll pads placed on pt ?500cc NS bolus ?2g Mg ?Cards PA to bedside ?1/2 amp atropine x2 ?Tx to 2H24 ? ?Plan of Care:  ?Tx  to 2H24.  ? ? ?Event Summary:  ? ?MD Notified: Dr. Amedeo Kinsman MD) notified PTA RRT, Dr. Marlowe Sax notified, Dr. Meng(cards PA) notified and came to bedside.  ?Call [id number] ?Arrival [id number] ?End [id number] ? ?Dillard Essex, RN ?

## 2022-04-10 NOTE — Interval H&P Note (Signed)
History and Physical Interval Note: ? ?04/10/2022 ?9:27 AM ? ?Jordan Lane  has presented today for surgery, with the diagnosis of complete heart block.  The various methods of treatment have been discussed with the patient and family. After consideration of risks, benefits and other options for treatment, the patient has consented to  Procedure(s): ?TEMPORARY PACEMAKER (N/A) as a surgical intervention.  The patient's history has been reviewed, patient examined, no change in status, stable for surgery.  I have reviewed the patient's chart and labs.  Questions were answered to the patient's satisfaction.   ? ? ?Armonie Mettler ? ? ?

## 2022-04-10 NOTE — Progress Notes (Signed)
?PROGRESS NOTE ? ?Jordan Lane YVO:[id number] DOB: 1946/03/20 DOA: 04/08/2022 ?PCP: Lavone Orn, MD ? ? LOS: 1 day  ? ?Brief Narrative / Interim history: ?76 year old female with HTN, CVA, GERD, IBS, who comes into the hospital with complaints of altered mental status.  She was seen in the ED for altered mental status on 04/07/2022 as a code stroke.  Neurology consulted at that time.  An MRI was negative for CVA and EEG was without seizure activity, and she was sent home on Keflex for presumed UTI.  She continued to be off of her baseline, was more confused today was brought back.  She was febrile and hypotensive today, also found to be bradycardic.  CT scan of the abdomen pelvis showed acute diverticulitis.  She was placed on antibiotics and admitted to the hospital.  Due to presence of bradycardia on admission, cardiology consulted. ? ?Hospital events: ?5/10 presents to the ED for persistent confusion, found to be febrile and have diverticulitis, admitted to the hospital ?5/12 developed complete heart block, transferred to the ICU, temporary pacing wire placed ? ?Subjective / 24h Interval events: ?Just arrived to the ICU when I saw her this morning, no specific complaints.  Feels tired.  Complains of lack of appetite.  Denies any chest pain, denies any palpitations. ? ?Assesement and Plan: ?Principal Problem: ?  Diverticulitis large intestine ?Active Problems: ?  Essential hypertension ?  Sinus bradycardia ?  HLD (hyperlipidemia) ?  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  Heart block AV complete ([place]) ? ? ?Principal problem ?Sepsis due to acute diverticulitis-patient met sepsis criteria on admission with significant leukocytosis, fever, and a source as was evident on the CT scan of the abdomen and pelvis.  She was placed on Zosyn, sepsis physiology appears to be improving.  White count improving today.  Still has some tenderness to exam.  She has no appetite and really  has not tried p.o., hopefully after today's temporary pacemaker placement. ?  ?Active problems ?Complete heart block-patient developed this morning complete heart block.  She was moved to the ICU right away.  Received atropine x2 and has been placed on dopamine infusion.  EP consulted, she will get a temporary pacemaker today.  Delay placement of permanent 1 due to her sepsis in the setting of acute infection.  I will discuss with ID later on today regarding appropriate timing, however I suspect that if her white count improves, clinically improves and blood cultures remain negative should be OK to do a pacemaker early next week ? ?Prior CVA-continue statin, Plavix.  She was just worked up couple of days in the ED and was negative for an acute CVA. ?  ?Essential hypertension-continue home medications as below ? ?Acute metabolic encephalopathy-likely in the setting of acute infectious process.  Improving with antibiotics and she is now more clear than on admission ? ?Hyperlipidemia-continue statin ?  ?Hypokalemia, hypomagnesemia-still low, continue to replete ?  ?Scheduled Meds: ? atorvastatin  20 mg Oral QPM  ? Chlorhexidine Gluconate Cloth  6 each Topical Daily  ? clopidogrel  75 mg Oral QPM  ? enoxaparin (LOVENOX) injection  40 mg Subcutaneous Q24H  ? escitalopram  20 mg Oral Daily  ? fentaNYL (SUBLIMAZE) injection  50 mcg Intravenous Once  ? pantoprazole  40 mg Oral Daily  ? potassium chloride  40 mEq Oral Once  ? QUEtiapine  25 mg Oral Daily  ? senna  1 tablet Oral BID  ? ?Continuous Infusions: ?  sodium chloride    ? DOPamine 5 mcg/kg/min (04/10/22 0654)  ? magnesium sulfate bolus IVPB    ? piperacillin-tazobactam (ZOSYN)  IV 3.375 g (04/09/22 2112)  ? ?PRN Meds:.acetaminophen **OR** acetaminophen, diphenhydrAMINE, fluticasone, ketorolac, midazolam ? ?Diet Orders (From admission, onward)  ? ?  Start     Ordered  ? 04/10/22 0802  Diet NPO time specified Except for: Sips with Meds  Diet effective now        ?Question:  Except for  Answer:  Ferrel Logan with Meds  ? 04/10/22 0801  ? ?  ?  ? ?  ? ? ?DVT prophylaxis: enoxaparin (LOVENOX) injection 40 mg Start: 04/09/22 1600 ? ? ?Lab Results  ?Component Value Date  ? PLT 236 04/10/2022  ? ? ?  Code Status: Full Code ? ?Family Communication: Daughter and husband present at bedside ? ?Status is: Inpatient ? ?Remains inpatient appropriate because: Severity of illness ? ? ?Level of care: ICU ? ?Consultants:  ?Cardiology - EP ? ?Procedures:  ?none ? ?Microbiology  ?Blood cultures 5/11 >NGTD ? ?Antimicrobials: ?Zosyn 5/11 ? ? ?Objective: ?Vitals:  ? 04/10/22 0620 04/10/22 0625 04/10/22 0630 04/10/22 2518  ?BP: (!) 113/49 (!) 92/54 (!) 126/93   ?Pulse: (!) 37 (!) 37 (!) 42 (!) 37  ?Resp:    14  ?Temp:      ?TempSrc:      ?SpO2: 98% 99% 98% 98%  ? ? ?Intake/Output Summary (Last 24 hours) at 04/10/2022 0946 ?Last data filed at 04/09/2022 1817 ?Gross per 24 hour  ?Intake 3382.01 ml  ?Output --  ?Net 3382.01 ml  ? ?Wt Readings from Last 3 Encounters:  ?04/07/22 68 kg  ?03/31/22 69.4 kg  ?03/05/22 72.1 kg  ? ? ?Examination: ? ?Constitutional: NAD ?Eyes: no scleral icterus ?ENMT: Mucous membranes are moist.  ?Neck: normal, supple ?Respiratory: clear to auscultation bilaterally, no wheezing, no crackles. Normal respiratory effort.   ?Cardiovascular: Regular, bradycardic.  No edema ?Abdomen: Mild tenderness to palpation of the left lower quadrant, no guarding or rebound.  Bowel sounds positive ?Musculoskeletal: no clubbing / cyanosis.  ?Skin: no rashes ?Neurologic: Nonfocal ? ? ?Data Reviewed: I have independently reviewed following labs and imaging studies  ? ?CBC ?Recent Labs  ?Lab 04/07/22 ?2144 04/07/22 ?2147 04/08/22 ?2137 04/08/22 ?2256 04/10/22 ?0301  ?WBC 18.4*  --  20.3*  --  15.0*  ?HGB 14.0 14.6 13.9 13.9 10.6*  ?HCT 41.7 43.0 40.0 41.0 31.3*  ?PLT 265  --  298  --  236  ?MCV 94.6  --  92.0  --  92.3  ?MCH 31.7  --  32.0  --  31.3  ?MCHC 33.6  --  34.8  --  33.9  ?RDW 12.7  --  12.7   --  12.5  ?LYMPHSABS 3.4  --  2.2  --   --   ?MONOABS 1.8*  --  1.9*  --   --   ?EOSABS 0.1  --  0.0  --   --   ?BASOSABS 0.1  --  0.1  --   --   ? ? ?Recent Labs  ?Lab 04/07/22 ?2144 04/07/22 ?2147 04/07/22 ?2159 04/08/22 ?2137 04/08/22 ?2256 04/08/22 ?2310 04/09/22 ?0050 04/09/22 ?1011 04/10/22 ?0301  ?NA 138 137  --  141 140  --   --  140 138  ?K 3.5 3.4*  --  3.2* 3.2*  --   --  3.3* 3.4*  ?CL 103 101  --  104  --   --   --  108 106  ?CO2 24  --   --  29  --   --   --  23 22  ?GLUCOSE 109* 110*  --  124*  --   --   --  115* 105*  ?BUN 8 9  --  10  --   --   --  8 8  ?CREATININE 0.84 0.70  --  0.92  --   --   --  0.76 0.96  ?CALCIUM 9.4  --   --  9.1  --   --   --  8.5* 8.3*  ?AST 24  --   --  18  --   --   --   --  20  ?ALT 14  --   --  12  --   --   --   --  13  ?ALKPHOS 67  --   --  64  --   --   --   --  52  ?BILITOT 0.9  --   --  1.9*  --   --   --   --  2.3*  ?ALBUMIN 3.8  --   --  3.4*  --   --   --   --  2.8*  ?MG  --   --   --   --   --   --   --   --  1.3*  ?LATICACIDVEN  --   --   --   --   --  1.2 1.4  --   --   ?INR  --   --  1.1 1.2  --   --   --   --   --   ?AMMONIA  --   --   --  20  --   --   --   --   --   ? ? ?------------------------------------------------------------------------------------------------------------------ ?No results for input(s): CHOL, HDL, LDLCALC, TRIG, CHOLHDL, LDLDIRECT in the last 72 hours. ? ?Lab Results  ?Component Value Date  ? HGBA1C 5.3 09/12/2021  ? ?------------------------------------------------------------------------------------------------------------------ ?No results for input(s): TSH, T4TOTAL, T3FREE, THYROIDAB in the last 72 hours. ? ?Invalid input(s): FREET3 ? ?Cardiac Enzymes ?No results for input(s): CKMB, TROPONINI, MYOGLOBIN in the last 168 hours. ? ?Invalid input(s): CK ?------------------------------------------------------------------------------------------------------------------ ?No results found for: BNP ? ?CBG: ?Recent Labs  ?Lab  04/08/22 ?2136  ?GLUCAP 119*  ? ? ?Recent Results (from the past 240 hour(s))  ?Resp Panel by RT-PCR (Flu A&B, Covid) Nasopharyngeal Swab     Status: None  ? Collection Time: 04/07/22  9:42 PM  ? Specimen: Nasopharyngeal

## 2022-04-11 DIAGNOSIS — K5732 Diverticulitis of large intestine without perforation or abscess without bleeding: Secondary | ICD-10-CM | POA: Diagnosis not present

## 2022-04-11 LAB — GLUCOSE, CAPILLARY: Glucose-Capillary: 86 mg/dL (ref 70–99)

## 2022-04-11 LAB — COMPREHENSIVE METABOLIC PANEL
ALT: 13 U/L (ref 0–44)
AST: 21 U/L (ref 15–41)
Albumin: 2.6 g/dL — ABNORMAL LOW (ref 3.5–5.0)
Alkaline Phosphatase: 53 U/L (ref 38–126)
Anion gap: 13 (ref 5–15)
BUN: 7 mg/dL — ABNORMAL LOW (ref 8–23)
CO2: 22 mmol/L (ref 22–32)
Calcium: 8.5 mg/dL — ABNORMAL LOW (ref 8.9–10.3)
Chloride: 108 mmol/L (ref 98–111)
Creatinine, Ser: 0.85 mg/dL (ref 0.44–1.00)
GFR, Estimated: >60 mL/min (ref >60)
Glucose, Bld: 79 mg/dL (ref 70–99)
Potassium: 3.4 mmol/L — ABNORMAL LOW (ref 3.5–5.1)
Sodium: 143 mmol/L (ref 135–145)
Total Bilirubin: 1.3 mg/dL — ABNORMAL HIGH (ref 0.3–1.2)
Total Protein: 5.8 g/dL — ABNORMAL LOW (ref 6.5–8.1)

## 2022-04-11 LAB — CBC
HCT: 32.1 % — ABNORMAL LOW (ref 36.0–46.0)
Hemoglobin: 10.8 g/dL — ABNORMAL LOW (ref 12.0–15.0)
MCH: 31.1 pg (ref 26.0–34.0)
MCHC: 33.6 g/dL (ref 30.0–36.0)
MCV: 92.5 fL (ref 80.0–100.0)
Platelets: 239 10*3/uL (ref 150–400)
RBC: 3.47 MIL/uL — ABNORMAL LOW (ref 3.87–5.11)
RDW: 12.5 % (ref 11.5–15.5)
WBC: 12.1 10*3/uL — ABNORMAL HIGH (ref 4.0–10.5)
nRBC: 0 % (ref 0.0–0.2)

## 2022-04-11 LAB — T4, FREE: Free T4: 0.89 ng/dL (ref 0.61–1.12)

## 2022-04-11 LAB — MAGNESIUM: Magnesium: 1.8 mg/dL (ref 1.7–2.4)

## 2022-04-11 LAB — TSH: TSH: 1.974 u[IU]/mL (ref 0.350–4.500)

## 2022-04-11 MED ORDER — POTASSIUM CHLORIDE CRYS ER 20 MEQ PO TBCR
40.0000 meq | EXTENDED_RELEASE_TABLET | Freq: Once | ORAL | Status: AC
  Administered 2022-04-11: 40 meq via ORAL
  Filled 2022-04-11: qty 2

## 2022-04-11 MED ORDER — QUETIAPINE FUMARATE 25 MG PO TABS
25.0000 mg | ORAL_TABLET | Freq: Every day | ORAL | Status: DC
  Administered 2022-04-11 – 2022-04-17 (×7): 25 mg via ORAL
  Filled 2022-04-11 (×7): qty 1

## 2022-04-11 NOTE — Progress Notes (Addendum)
?PROGRESS NOTE ? ?ESTEPHANIE HUBBS HWE:[id number] DOB: 1946-04-01 DOA: 04/08/2022 ?PCP: Lavone Orn, MD ? ? LOS: 2 days  ? ?Brief Narrative / Interim history: ?76 year old female with HTN, CVA, GERD, IBS, who comes into the hospital with complaints of altered mental status.  She was seen in the ED for altered mental status on 04/07/2022 as a code stroke.  Neurology consulted at that time.  An MRI was negative for CVA and EEG was without seizure activity, and she was sent home on Keflex for presumed UTI.  She continued to be off of her baseline, was more confused today was brought back.  She was febrile and hypotensive today, also found to be bradycardic.  CT scan of the abdomen pelvis showed acute diverticulitis.  She was placed on antibiotics and admitted to the hospital.  Due to presence of bradycardia on admission, cardiology consulted. ? ?Hospital events: ?5/10 presents to the ED for persistent confusion, found to be febrile and have diverticulitis, admitted to the hospital ?5/12 developed complete heart block, transferred to the ICU, temporary pacing wire placed ? ?Subjective / 24h Interval events: ?She is doing much better this morning.  Denies chest pain, denies abdominal pain, no nausea or vomiting.  She states that she is hungry. ? ?Assesement and Plan: ?Principal Problem: ?  Diverticulitis large intestine ?Active Problems: ?  Essential hypertension ?  Sinus bradycardia ?  HLD (hyperlipidemia) ?  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  Heart block AV complete ([place]) ?  Acute metabolic encephalopathy ? ? ?Principal problem ?Sepsis due to acute diverticulitis-patient met sepsis criteria on admission with significant leukocytosis, fever, and a source as was evident on the CT scan of the abdomen and pelvis.  She was placed on Zosyn, sepsis physiology appears to be improving.  White count is almost normalized now.  Still has mild tenderness to exam but improved.  Advance  diet today.  Discussed with Dr. Juleen China with infectious disease and case reviewed.  He recommends to finish a full 10-day course of antibiotics, ensure complete clinical defervescence and then place the permanent pacemaker. ?  ?Active problems ?Complete heart block-on 5/12 AM patient developed complete heart block.  She was moved to the ICU, and underwent emergent temporary pacemaker placement.  She is pending permanent pacemaker once sepsis physiology resolved and acute diverticulitis is treated ? ?Prior CVA-continue statin, Plavix.  She was just worked up couple of days in the ED and was negative for an acute CVA. ?  ?Essential hypertension-continue home medications as below ? ?Acute metabolic encephalopathy-seems resolved this morning, she appears close to baseline ? ?Acute urinary retention-due to immobility, ICU, had to place Foley yesterday.  Will attempt voiding trial in 2 to 3 days ? ?Hyperlipidemia-continue statin ?  ?Hypokalemia, hypomagnesemia-continue to monitor daily and replete as indicated ?  ?Scheduled Meds: ? atorvastatin  20 mg Oral QPM  ? Chlorhexidine Gluconate Cloth  6 each Topical Daily  ? clopidogrel  75 mg Oral QPM  ? enoxaparin (LOVENOX) injection  40 mg Subcutaneous Q24H  ? escitalopram  20 mg Oral Daily  ? fentaNYL (SUBLIMAZE) injection  50 mcg Intravenous Once  ? pantoprazole  40 mg Oral Daily  ? potassium chloride  40 mEq Oral Once  ? QUEtiapine  25 mg Oral QHS  ? senna  1 tablet Oral BID  ? ?Continuous Infusions: ? sodium chloride Stopped (04/10/22 1401)  ? sodium chloride Stopped (04/11/22 0011)  ? piperacillin-tazobactam (ZOSYN)  IV 12.5  mL/hr at 04/11/22 1000  ? ?PRN Meds:.sodium chloride, acetaminophen **OR** acetaminophen, diphenhydrAMINE, fluticasone, ketorolac, LORazepam ? ?Diet Orders (From admission, onward)  ? ?  Start     Ordered  ? 04/11/22 8921  Diet clear liquid Room service appropriate? Yes; Fluid consistency: Thin  Diet effective now       ?Question Answer Comment  ?Room  service appropriate? Yes   ?Fluid consistency: Thin   ?  ? 04/11/22 0831  ? ?  ?  ? ?  ? ? ?DVT prophylaxis: enoxaparin (LOVENOX) injection 40 mg Start: 04/09/22 1600 ? ? ?Lab Results  ?Component Value Date  ? PLT 239 04/11/2022  ? ? ?  Code Status: Full Code ? ?Family Communication: Daughter and husband present at bedside ? ?Status is: Inpatient ? ?Remains inpatient appropriate because: Severity of illness ? ? ?Level of care: ICU ? ?Consultants:  ?Cardiology - EP ? ?Procedures:  ?none ? ?Microbiology  ?Blood cultures 5/11 >NGTD ? ?Antimicrobials: ?Zosyn 5/11 ? ? ?Objective: ?Vitals:  ? 04/11/22 0500 04/11/22 0600 04/11/22 0700 04/11/22 0745  ?BP: (!) 146/74 135/78 129/83   ?Pulse: 80 80 80   ?Resp: 14 (!) 22 (!) 21   ?Temp: 98.1 ?F (36.7 ?C)   98.6 ?F (37 ?C)  ?TempSrc: Oral   Oral  ?SpO2: 95% 96% 97%   ? ? ?Intake/Output Summary (Last 24 hours) at 04/11/2022 1031 ?Last data filed at 04/11/2022 1000 ?Gross per 24 hour  ?Intake 363.81 ml  ?Output 1540 ml  ?Net -1176.19 ml  ? ? ?Wt Readings from Last 3 Encounters:  ?04/07/22 68 kg  ?03/31/22 69.4 kg  ?03/05/22 72.1 kg  ? ? ?Examination: ? ?Constitutional: NAD ?Eyes: lids and conjunctivae normal, no scleral icterus ?ENMT: mmm ?Neck: normal, supple ?Respiratory: clear to auscultation bilaterally, no wheezing, no crackles. Normal respiratory effort.  ?Cardiovascular: Regular rate and rhythm, no murmurs / rubs / gallops. No LE edema. ?Abdomen: soft, mild tenderness left lower quadrant ?Skin: no rashes ?Neurologic: no focal deficits, equal strength ? ? ?Data Reviewed: I have independently reviewed following labs and imaging studies  ? ?CBC ?Recent Labs  ?Lab 04/07/22 ?2144 04/07/22 ?2147 04/08/22 ?2137 04/08/22 ?2256 04/10/22 ?0301 04/11/22 ?0542  ?WBC 18.4*  --  20.3*  --  15.0* 12.1*  ?HGB 14.0 14.6 13.9 13.9 10.6* 10.8*  ?HCT 41.7 43.0 40.0 41.0 31.3* 32.1*  ?PLT 265  --  298  --  236 239  ?MCV 94.6  --  92.0  --  92.3 92.5  ?MCH 31.7  --  32.0  --  31.3 31.1  ?MCHC 33.6   --  34.8  --  33.9 33.6  ?RDW 12.7  --  12.7  --  12.5 12.5  ?LYMPHSABS 3.4  --  2.2  --   --   --   ?MONOABS 1.8*  --  1.9*  --   --   --   ?EOSABS 0.1  --  0.0  --   --   --   ?BASOSABS 0.1  --  0.1  --   --   --   ? ? ? ?Recent Labs  ?Lab 04/07/22 ?2144 04/07/22 ?2147 04/07/22 ?2159 04/08/22 ?2137 04/08/22 ?2256 04/08/22 ?2310 04/09/22 ?0050 04/09/22 ?1011 04/10/22 ?0301 04/11/22 ?0542  ?NA 138 137  --  141 140  --   --  140 138 143  ?K 3.5 3.4*  --  3.2* 3.2*  --   --  3.3* 3.4* 3.4*  ?CL 103 101  --  104  --   --   --  108 106 108  ?CO2 24  --   --  29  --   --   --  _0 ?GLUCOSE 109* 110*  --  124*  --   --   --  115* 105* 79  ?BUN 8 9  --  10  --   --   --  8 8 7*  ?CREATININE 0.84 0.70  --  0.92  --   --   --  0.76 0.96 0.85  ?CALCIUM 9.4  --   --  9.1  --   --   --  8.5* 8.3* 8.5*  ?AST 24  --   --  18  --   --   --   --  20 21  ?ALT 14  --   --  12  --   --   --   --  13 13  ?ALKPHOS 67  --   --  64  --   --   --   --  52 53  ?BILITOT 0.9  --   --  1.9*  --   --   --   --  2.3* 1.3*  ?ALBUMIN 3.8  --   --  3.4*  --   --   --   --  2.8* 2.6*  ?MG  --   --   --   --   --   --   --   --  1.3* 1.8  ?LATICACIDVEN  --   --   --   --   --  1.2 1.4  --   --   --   ?INR  --   --  1.1 1.2  --   --   --   --   --   --   ?TSH  --   --   --   --   --   --   --   --   --  1.974  ?AMMONIA  --   --   --  20  --   --   --   --   --   --   ? ? ? ?------------------------------------------------------------------------------------------------------------------ ?No results for input(s): CHOL, HDL, LDLCALC, TRIG, CHOLHDL, LDLDIRECT in the last 72 hours. ? ?Lab Results  ?Component Value Date  ? HGBA1C 5.3 09/12/2021  ? ?------------------------------------------------------------------------------------------------------------------ ?Recent Labs  ?  04/11/22 ?0542  ?TSH 1.974  ? ? ?Cardiac Enzymes ?No results for input(s): CKMB, TROPONINI, MYOGLOBIN in the last 168 hours. ? ?Invalid input(s):  CK ?------------------------------------------------------------------------------------------------------------------ ?No results found for: BNP ? ?CBG: ?Recent Labs  ?Lab 04/08/22 ?2136 04/10/22 ?2359  ?GLUCAP 119* 86  ? ? ?

## 2022-04-11 NOTE — Progress Notes (Signed)
? ?Progress Note ? ?Patient Name: ZANIYAH WERNETTE ?Date of Encounter: 04/11/2022 ? ?[facility] Cardiologist: None  ? ?Subjective  ? ?NAEO. Family at bedside. ? ?Inpatient Medications  ?  ?Scheduled Meds: ? atorvastatin  20 mg Oral QPM  ? Chlorhexidine Gluconate Cloth  6 each Topical Daily  ? clopidogrel  75 mg Oral QPM  ? enoxaparin (LOVENOX) injection  40 mg Subcutaneous Q24H  ? escitalopram  20 mg Oral Daily  ? fentaNYL (SUBLIMAZE) injection  50 mcg Intravenous Once  ? pantoprazole  40 mg Oral Daily  ? potassium chloride  40 mEq Oral Once  ? QUEtiapine  25 mg Oral Daily  ? senna  1 tablet Oral BID  ? ?Continuous Infusions: ? sodium chloride Stopped (04/10/22 1401)  ? sodium chloride Stopped (04/11/22 0011)  ? piperacillin-tazobactam (ZOSYN)  IV 3.375 g (04/11/22 3559)  ? ?PRN Meds: ?sodium chloride, acetaminophen **OR** acetaminophen, diphenhydrAMINE, fluticasone, ketorolac, LORazepam  ? ?Vital Signs  ?  ?Vitals:  ? 04/11/22 0400 04/11/22 0500 04/11/22 0600 04/11/22 0700  ?BP: 132/74 (!) 146/74 135/78 129/83  ?Pulse: 80 80 80 80  ?Resp: (!) 22 14 (!) 22 (!) 21  ?Temp:  98.1 ?F (36.7 ?C)    ?TempSrc:  Oral    ?SpO2: 94% 95% 96% 97%  ? ? ?Intake/Output Summary (Last 24 hours) at 04/11/2022 0742 ?Last data filed at 04/11/2022 0700 ?Gross per 24 hour  ?Intake 360.62 ml  ?Output 1500 ml  ?Net -1139.38 ml  ? ? ?  04/07/2022  ?  9:53 PM 03/31/2022  ? 11:02 AM 03/05/2022  ?  9:29 AM  ?Last 3 Weights  ?Weight (lbs) 150 lb 153 lb 159 lb  ?Weight (kg) 68.04 kg 69.4 kg 72.122 kg  ?   ? ?Telemetry  ?  ?Paced - Personally Reviewed ? ? ?Physical Exam  ? ?GEN: No acute distress.   ?Neck: No JVD ?Cardiac: RRR, no murmurs, rubs, or gallops.  ?Respiratory: Clear to auscultation bilaterally. ?GI: Soft, nontender, non-distended  ?MS: No edema; No deformity. ?Neuro:  Nonfocal. Confused this AM. ?Psych: Normal affect  ? ?Labs  ?  ?High Sensitivity Troponin:  No results for input(s): TROPONINIHS in the last 720 hours.   ?Chemistry ?Recent  Labs  ?Lab 04/08/22 ?2137 04/08/22 ?2256 04/09/22 ?1011 04/10/22 ?0301 04/11/22 ?0542  ?NA 141   < > 140 138 143  ?K 3.2*   < > 3.3* 3.4* 3.4*  ?CL 104  --  108 106 108  ?CO2 29  --  '23 22 22  '$ ?GLUCOSE 124*  --  115* 105* 79  ?BUN 10  --  8 8 7*  ?CREATININE 0.92  --  0.76 0.96 0.85  ?CALCIUM 9.1  --  8.5* 8.3* 8.5*  ?MG  --   --   --  1.3* 1.8  ?PROT 7.0  --   --  5.5* 5.8*  ?ALBUMIN 3.4*  --   --  2.8* 2.6*  ?AST 18  --   --  20 21  ?ALT 12  --   --  13 13  ?ALKPHOS 64  --   --  52 53  ?BILITOT 1.9*  --   --  2.3* 1.3*  ?GFRNONAA >60  --  >60 >60 >60  ?ANIONGAP 8  --  '9 10 13  '$ ? < > = values in this interval not displayed.  ?  ?Lipids No results for input(s): CHOL, TRIG, HDL, LABVLDL, LDLCALC, CHOLHDL in the last 168 hours.  ?Hematology ?  Recent Labs  ?Lab 04/08/22 ?2137 04/08/22 ?2256 04/10/22 ?0301 04/11/22 ?0542  ?WBC 20.3*  --  15.0* 12.1*  ?RBC 4.35  --  3.39* 3.47*  ?HGB 13.9 13.9 10.6* 10.8*  ?HCT 40.0 41.0 31.3* 32.1*  ?MCV 92.0  --  92.3 92.5  ?MCH 32.0  --  31.3 31.1  ?MCHC 34.8  --  33.9 33.6  ?RDW 12.7  --  12.5 12.5  ?PLT 298  --  236 239  ? ?Thyroid  ?Recent Labs  ?Lab 04/11/22 ?0542  ?TSH 1.974  ?FREET4 0.89  ?  ?BNPNo results for input(s): BNP, PROBNP in the last 168 hours.  ?DDimer No results for input(s): DDIMER in the last 168 hours.  ? ?Radiology  ?  ?CARDIAC CATHETERIZATION ? ?Result Date: 04/10/2022 ?Successful TVP for CHB.   ? ? ?Assessment & Plan  ?  ?76yo with HTN, GERD, arthritis, HLD, stroke in 2016 who is admitted with complete heart block now s/p temporary pacemaker placement. ? ?#CHB ?No underlying escape at VVI 30. Sinus rhythm underlying CHB. ?Temp pacer with capture < 18m.  ?Will continue to monitor rhythm while diverticulitis is treated. ? ? ?For questions or updates, please contact [place]?Please consult [email or web address] for contact info under  ? ?  ?   ?Signed, ?CVickie Epley MD  ?04/11/2022, 7:42 AM   ? ?

## 2022-04-12 ENCOUNTER — Encounter (HOSPITAL_COMMUNITY): Payer: Self-pay | Admitting: Internal Medicine

## 2022-04-12 DIAGNOSIS — K5732 Diverticulitis of large intestine without perforation or abscess without bleeding: Secondary | ICD-10-CM | POA: Diagnosis not present

## 2022-04-12 LAB — BASIC METABOLIC PANEL
Anion gap: 7 (ref 5–15)
BUN: 7 mg/dL — ABNORMAL LOW (ref 8–23)
CO2: 24 mmol/L (ref 22–32)
Calcium: 8.3 mg/dL — ABNORMAL LOW (ref 8.9–10.3)
Chloride: 110 mmol/L (ref 98–111)
Creatinine, Ser: 0.83 mg/dL (ref 0.44–1.00)
GFR, Estimated: >60 mL/min (ref >60)
Glucose, Bld: 103 mg/dL — ABNORMAL HIGH (ref 70–99)
Potassium: 3.5 mmol/L (ref 3.5–5.1)
Sodium: 141 mmol/L (ref 135–145)

## 2022-04-12 LAB — CBC
HCT: 30.8 % — ABNORMAL LOW (ref 36.0–46.0)
Hemoglobin: 10.8 g/dL — ABNORMAL LOW (ref 12.0–15.0)
MCH: 31.9 pg (ref 26.0–34.0)
MCHC: 35.1 g/dL (ref 30.0–36.0)
MCV: 90.9 fL (ref 80.0–100.0)
Platelets: 231 10*3/uL (ref 150–400)
RBC: 3.39 MIL/uL — ABNORMAL LOW (ref 3.87–5.11)
RDW: 12.4 % (ref 11.5–15.5)
WBC: 9.3 10*3/uL (ref 4.0–10.5)
nRBC: 0 % (ref 0.0–0.2)

## 2022-04-12 MED ORDER — PNEUMOCOCCAL 20-VAL CONJ VACC 0.5 ML IM SUSY
0.5000 mL | PREFILLED_SYRINGE | INTRAMUSCULAR | Status: DC
  Filled 2022-04-12: qty 0.5

## 2022-04-12 MED ORDER — POTASSIUM CHLORIDE CRYS ER 20 MEQ PO TBCR
40.0000 meq | EXTENDED_RELEASE_TABLET | Freq: Once | ORAL | Status: AC
  Administered 2022-04-12: 40 meq via ORAL
  Filled 2022-04-12: qty 2

## 2022-04-12 MED ORDER — IPRATROPIUM-ALBUTEROL 0.5-2.5 (3) MG/3ML IN SOLN
3.0000 mL | Freq: Four times a day (QID) | RESPIRATORY_TRACT | Status: DC | PRN
  Administered 2022-04-12: 3 mL via RESPIRATORY_TRACT
  Filled 2022-04-12: qty 3

## 2022-04-12 MED ORDER — FUROSEMIDE 10 MG/ML IJ SOLN
40.0000 mg | Freq: Once | INTRAMUSCULAR | Status: AC
  Administered 2022-04-12: 40 mg via INTRAVENOUS
  Filled 2022-04-12: qty 4

## 2022-04-12 NOTE — Progress Notes (Signed)
?PROGRESS NOTE ? ?Jordan Lane RRN:[id number] DOB: October 20, 1946 DOA: 04/08/2022 ?PCP: Lavone Orn, MD ? ? LOS: 3 days  ? ?Brief Narrative / Interim history: ?76 year old female with HTN, CVA, GERD, IBS, who comes into the hospital with complaints of altered mental status.  She was seen in the ED for altered mental status on 04/07/2022 as a code stroke.  Neurology consulted at that time.  An MRI was negative for CVA and EEG was without seizure activity, and she was sent home on Keflex for presumed UTI.  She continued to be off of her baseline, was more confused today was brought back.  She was febrile and hypotensive today, also found to be bradycardic.  CT scan of the abdomen pelvis showed acute diverticulitis.  She was placed on antibiotics and admitted to the hospital.  Due to presence of bradycardia on admission, cardiology consulted. ? ?Hospital events: ?5/10 presents to the ED for persistent confusion, found to be febrile and have diverticulitis, admitted to the hospital ?5/12 developed complete heart block, transferred to the ICU, temporary pacing wire placed ? ?Subjective / 24h Interval events: ?Was confused overnight, had a paradoxical reaction to Ativan.  Remains confused this morning but appears calm.  Appears slightly short of breath ? ?Assesement and Plan: ?Principal Problem: ?  Diverticulitis large intestine ?Active Problems: ?  Essential hypertension ?  Sinus bradycardia ?  HLD (hyperlipidemia) ?  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  Heart block AV complete ([place]) ?  Acute metabolic encephalopathy ? ? ?Principal problem ?Sepsis due to acute diverticulitis-patient met sepsis criteria on admission with significant leukocytosis, fever, and a source as was evident on the CT scan of the abdomen and pelvis.  She was placed on Zosyn, sepsis physiology appears to be improving.  White count has normalized, abdominal tenderness improving.  Continue to advance to a  soft diet. Discussed with Dr. Juleen China with infectious disease and case reviewed.  He recommends to finish a full 10-day course of antibiotics, ensure complete clinical defervescence and then place the permanent pacemaker.  Discussed ID recommendations with Dr. Quentin Ore with cardiology as well. ?  ?Active problems ?Complete heart block-on 5/12 AM patient developed complete heart block.  She was moved to the ICU, and underwent emergent temporary pacemaker placement.  She is pending permanent pacemaker once acute diverticulitis is fully treated with 10 days of antibiotics ? ?Prior CVA-continue statin, Plavix.  She was just worked up couple of days in the ED and was negative for an acute CVA. ?  ?Essential hypertension-continue home medications as below ? ?Acute metabolic encephalopathy-seems resolved this morning, she appears close to baseline ? ?Acute urinary retention-due to immobility, ICU, had to place Foley.  Still confused at times, when mental status clears and stays better, attempt voiding trial ? ?Hyperlipidemia-continue statin ?  ?Hypokalemia, hypomagnesemia-continue to monitor daily and replete as indicated ?  ?Scheduled Meds: ? atorvastatin  20 mg Oral QPM  ? Chlorhexidine Gluconate Cloth  6 each Topical Daily  ? clopidogrel  75 mg Oral QPM  ? enoxaparin (LOVENOX) injection  40 mg Subcutaneous Q24H  ? escitalopram  20 mg Oral Daily  ? fentaNYL (SUBLIMAZE) injection  50 mcg Intravenous Once  ? pantoprazole  40 mg Oral Daily  ? potassium chloride  40 mEq Oral Once  ? QUEtiapine  25 mg Oral QHS  ? senna  1 tablet Oral BID  ? ?Continuous Infusions: ? sodium chloride Stopped (04/11/22 0011)  ? piperacillin-tazobactam (ZOSYN)  IV Stopped (04/12/22 4098)  ? ?PRN Meds:.sodium chloride, acetaminophen **OR** acetaminophen, diphenhydrAMINE, fluticasone, ipratropium-albuterol, ketorolac ? ?Diet Orders (From admission, onward)  ? ?  Start     Ordered  ? 04/12/22 1030  DIET SOFT Room service appropriate? Yes; Fluid  consistency: Thin  Diet effective now       ?Question Answer Comment  ?Room service appropriate? Yes   ?Fluid consistency: Thin   ?  ? 04/12/22 1029  ? ?  ?  ? ?  ? ? ?DVT prophylaxis: enoxaparin (LOVENOX) injection 40 mg Start: 04/09/22 1600 ? ? ?Lab Results  ?Component Value Date  ? PLT 231 04/12/2022  ? ? ?  Code Status: Full Code ? ?Family Communication: Daughter and husband present at bedside ? ?Status is: Inpatient ? ?Remains inpatient appropriate because: Severity of illness ? ? ?Level of care: ICU ? ?Consultants:  ?Cardiology - EP ? ?Procedures:  ?none ? ?Microbiology  ?Blood cultures 5/11 >NGTD ? ?Antimicrobials: ?Zosyn 5/11 ? ? ?Objective: ?Vitals:  ? 04/12/22 0900 04/12/22 0910 04/12/22 0952 04/12/22 1010  ?BP: (!) 157/123 (!) 154/85    ?Pulse: 80  80 80  ?Resp: 20  (!) 23 (!) 23  ?Temp:      ?TempSrc:      ?SpO2: 96%  96% 99%  ? ? ?Intake/Output Summary (Last 24 hours) at 04/12/2022 1029 ?Last data filed at 04/12/2022 0900 ?Gross per 24 hour  ?Intake 1117.37 ml  ?Output 715 ml  ?Net 402.37 ml  ? ? ?Wt Readings from Last 3 Encounters:  ?04/07/22 68 kg  ?03/31/22 69.4 kg  ?03/05/22 72.1 kg  ? ? ?Examination: ? ?Constitutional: NAD ?Eyes: lids and conjunctivae normal, no scleral icterus ?ENMT: mmm ?Neck: normal, supple ?Respiratory: Faint end expiratory wheezing ?Cardiovascular: Regular rate and rhythm, no murmurs / rubs / gallops. Trace edema. ?Abdomen: soft, no distention, no tenderness. Bowel sounds positive.  ?Skin: no rashes ?Neurologic: no focal deficits, equal strength ? ? ?Data Reviewed: I have independently reviewed following labs and imaging studies  ? ?CBC ?Recent Labs  ?Lab 04/07/22 ?2144 04/07/22 ?2147 04/08/22 ?2137 04/08/22 ?2256 04/10/22 ?0301 04/11/22 ?1191 04/12/22 ?0134  ?WBC 18.4*  --  20.3*  --  15.0* 12.1* 9.3  ?HGB 14.0   < > 13.9 13.9 10.6* 10.8* 10.8*  ?HCT 41.7   < > 40.0 41.0 31.3* 32.1* 30.8*  ?PLT 265  --  298  --  236 239 231  ?MCV 94.6  --  92.0  --  92.3 92.5 90.9  ?MCH 31.7   --  32.0  --  31.3 31.1 31.9  ?MCHC 33.6  --  34.8  --  33.9 33.6 35.1  ?RDW 12.7  --  12.7  --  12.5 12.5 12.4  ?LYMPHSABS 3.4  --  2.2  --   --   --   --   ?MONOABS 1.8*  --  1.9*  --   --   --   --   ?EOSABS 0.1  --  0.0  --   --   --   --   ?BASOSABS 0.1  --  0.1  --   --   --   --   ? < > = values in this interval not displayed.  ? ? ? ?Recent Labs  ?Lab 04/07/22 ?2144 04/07/22 ?2147 04/07/22 ?2159 04/08/22 ?2137 04/08/22 ?2256 04/08/22 ?2310 04/09/22 ?0050 04/09/22 ?1011 04/10/22 ?0301 04/11/22 ?4782 04/12/22 ?0134  ?NA 138   < >  --  141 140  --   --  140 138 143 141  ?K 3.5   < >  --  3.2* 3.2*  --   --  3.3* 3.4* 3.4* 3.5  ?CL 103   < >  --  104  --   --   --  108 106 108 110  ?CO2 24  --   --  29  --   --   --  _0 ?GLUCOSE 109*   < >  --  124*  --   --   --  115* 105* 79 103*  ?BUN 8   < >  --  10  --   --   --  8 8 7* 7*  ?CREATININE 0.84   < >  --  0.92  --   --   --  0.76 0.96 0.85 0.83  ?CALCIUM 9.4  --   --  9.1  --   --   --  8.5* 8.3* 8.5* 8.3*  ?AST 24  --   --  18  --   --   --   --  20 21  --   ?ALT 14  --   --  12  --   --   --   --  13 13  --   ?ALKPHOS 67  --   --  64  --   --   --   --  52 53  --   ?BILITOT 0.9  --   --  1.9*  --   --   --   --  2.3* 1.3*  --   ?ALBUMIN 3.8  --   --  3.4*  --   --   --   --  2.8* 2.6*  --   ?MG  --   --   --   --   --   --   --   --  1.3* 1.8  --   ?LATICACIDVEN  --   --   --   --   --  1.2 1.4  --   --   --   --   ?INR  --   --  1.1 1.2  --   --   --   --   --   --   --   ?TSH  --   --   --   --   --   --   --   --   --  1.974  --   ?AMMONIA  --   --   --  20  --   --   --   --   --   --   --   ? < > = values in this interval not displayed.  ? ? ? ?------------------------------------------------------------------------------------------------------------------ ?No results for input(s): CHOL, HDL, LDLCALC, TRIG, CHOLHDL, LDLDIRECT in the last 72 hours. ? ?Lab Results  ?Component Value Date  ? HGBA1C 5.3 09/12/2021   ? ?------------------------------------------------------------------------------------------------------------------ ?Recent Labs  ?  04/11/22 ?0542  ?TSH 1.974  ? ? ? ?Cardiac Enzymes ?No results for input(s): CKMB, TROPONINI, MYOGLOBIN in

## 2022-04-12 NOTE — Progress Notes (Signed)
? ?Progress Note ? ?Patient Name: Jordan Lane ?Date of Encounter: 04/12/2022 ? ?[facility] Cardiologist: None  ? ?Subjective  ? ?NAEO. Family at bedside. ? ?Inpatient Medications  ?  ?Scheduled Meds: ? atorvastatin  20 mg Oral QPM  ? Chlorhexidine Gluconate Cloth  6 each Topical Daily  ? clopidogrel  75 mg Oral QPM  ? enoxaparin (LOVENOX) injection  40 mg Subcutaneous Q24H  ? escitalopram  20 mg Oral Daily  ? fentaNYL (SUBLIMAZE) injection  50 mcg Intravenous Once  ? pantoprazole  40 mg Oral Daily  ? potassium chloride  40 mEq Oral Once  ? QUEtiapine  25 mg Oral QHS  ? senna  1 tablet Oral BID  ? ?Continuous Infusions: ? sodium chloride Stopped (04/11/22 0011)  ? piperacillin-tazobactam (ZOSYN)  IV Stopped (04/12/22 0817)  ? ?PRN Meds: ?sodium chloride, acetaminophen **OR** acetaminophen, diphenhydrAMINE, fluticasone, ipratropium-albuterol, ketorolac  ? ?Vital Signs  ?  ?Vitals:  ? 04/12/22 0900 04/12/22 0910 04/12/22 0952 04/12/22 1010  ?BP: (!) 157/123 (!) 154/85    ?Pulse: 80  80 80  ?Resp: 20  (!) 23 (!) 23  ?Temp:      ?TempSrc:      ?SpO2: 96%  96% 99%  ? ? ?Intake/Output Summary (Last 24 hours) at 04/12/2022 1011 ?Last data filed at 04/12/2022 0900 ?Gross per 24 hour  ?Intake 1117.37 ml  ?Output 715 ml  ?Net 402.37 ml  ? ? ? ?  04/07/2022  ?  9:53 PM 03/31/2022  ? 11:02 AM 03/05/2022  ?  9:29 AM  ?Last 3 Weights  ?Weight (lbs) 150 lb 153 lb 159 lb  ?Weight (kg) 68.04 kg 69.4 kg 72.122 kg  ?   ? ?Telemetry  ?  ?Paced - Personally Reviewed ? ? ?Physical Exam  ? ?GEN: No acute distress.   ?Neck: No JVD ?Cardiac: RRR, no murmurs, rubs, or gallops.  ?Respiratory: Clear to auscultation bilaterally. ?GI: Soft, nontender, non-distended  ?MS: No edema; No deformity. ?Neuro:  Nonfocal. Confused this AM. ?Psych: Normal affect  ? ?Labs  ?  ?High Sensitivity Troponin:  No results for input(s): TROPONINIHS in the last 720 hours.   ?Chemistry ?Recent Labs  ?Lab 04/08/22 ?2137 04/08/22 ?2256 04/10/22 ?0301 04/11/22 ?7741  04/12/22 ?0134  ?NA 141   < > 138 143 141  ?K 3.2*   < > 3.4* 3.4* 3.5  ?CL 104   < > 106 108 110  ?CO2 29   < > '22 22 24  '$ ?GLUCOSE 124*   < > 105* 79 103*  ?BUN 10   < > 8 7* 7*  ?CREATININE 0.92   < > 0.96 0.85 0.83  ?CALCIUM 9.1   < > 8.3* 8.5* 8.3*  ?MG  --   --  1.3* 1.8  --   ?PROT 7.0  --  5.5* 5.8*  --   ?ALBUMIN 3.4*  --  2.8* 2.6*  --   ?AST 18  --  20 21  --   ?ALT 12  --  13 13  --   ?ALKPHOS 64  --  52 53  --   ?BILITOT 1.9*  --  2.3* 1.3*  --   ?GFRNONAA >60   < > >60 >60 >60  ?ANIONGAP 8   < > '10 13 7  '$ ? < > = values in this interval not displayed.  ? ?  ?Lipids No results for input(s): CHOL, TRIG, HDL, LABVLDL, LDLCALC, CHOLHDL in the last 168 hours.  ?Hematology ?Recent  Labs  ?Lab 04/10/22 ?0301 04/11/22 ?2202 04/12/22 ?0134  ?WBC 15.0* 12.1* 9.3  ?RBC 3.39* 3.47* 3.39*  ?HGB 10.6* 10.8* 10.8*  ?HCT 31.3* 32.1* 30.8*  ?MCV 92.3 92.5 90.9  ?MCH 31.3 31.1 31.9  ?MCHC 33.9 33.6 35.1  ?RDW 12.5 12.5 12.4  ?PLT 236 239 231  ? ? ?Thyroid  ?Recent Labs  ?Lab 04/11/22 ?0542  ?TSH 1.974  ?FREET4 0.89  ? ?  ?BNPNo results for input(s): BNP, PROBNP in the last 168 hours.  ?DDimer No results for input(s): DDIMER in the last 168 hours.  ? ?Radiology  ?  ?No results found. ? ? ?Assessment & Plan  ?  ?76yo with HTN, GERD, arthritis, HLD, stroke in 2016 who is admitted with complete heart block now s/p temporary pacemaker placement. ? ?#CHB ?Intermittent escape at VVI 30. Sinus rhythm underlying. ?Temp pacer with capture < 52m.  ?Will continue to monitor rhythm while diverticulitis is treated. ?Timing of implant TBD. ? ?For questions or updates, please contact [place]?Please consult [email or web address] for contact info under  ? ?  ?   ?Signed, ?CVickie Epley MD  ?04/12/2022, 10:11 AM   ? ?

## 2022-04-12 NOTE — Progress Notes (Signed)
Education provided to patient and daughter. Printed handout on diverticulitis and diverticulitis diet. Printed handout given on permanent pacemaker. Patient and family educated on oral and hand hygiene. Educated on signs and symptoms of infection and when to contact provider. ?Erling Conte, RN ?

## 2022-04-13 DIAGNOSIS — G9341 Metabolic encephalopathy: Secondary | ICD-10-CM | POA: Diagnosis not present

## 2022-04-13 DIAGNOSIS — K5732 Diverticulitis of large intestine without perforation or abscess without bleeding: Secondary | ICD-10-CM | POA: Diagnosis not present

## 2022-04-13 DIAGNOSIS — I442 Atrioventricular block, complete: Secondary | ICD-10-CM | POA: Diagnosis not present

## 2022-04-13 DIAGNOSIS — I63012 Cerebral infarction due to thrombosis of left vertebral artery: Secondary | ICD-10-CM

## 2022-04-13 LAB — CBC
HCT: 32 % — ABNORMAL LOW (ref 36.0–46.0)
Hemoglobin: 11.1 g/dL — ABNORMAL LOW (ref 12.0–15.0)
MCH: 31.5 pg (ref 26.0–34.0)
MCHC: 34.7 g/dL (ref 30.0–36.0)
MCV: 90.9 fL (ref 80.0–100.0)
Platelets: 264 10*3/uL (ref 150–400)
RBC: 3.52 MIL/uL — ABNORMAL LOW (ref 3.87–5.11)
RDW: 12.4 % (ref 11.5–15.5)
WBC: 9.3 10*3/uL (ref 4.0–10.5)
nRBC: 0 % (ref 0.0–0.2)

## 2022-04-13 LAB — COMPREHENSIVE METABOLIC PANEL
ALT: 14 U/L (ref 0–44)
AST: 24 U/L (ref 15–41)
Albumin: 2.6 g/dL — ABNORMAL LOW (ref 3.5–5.0)
Alkaline Phosphatase: 51 U/L (ref 38–126)
Anion gap: 8 (ref 5–15)
BUN: 7 mg/dL — ABNORMAL LOW (ref 8–23)
CO2: 30 mmol/L (ref 22–32)
Calcium: 8.4 mg/dL — ABNORMAL LOW (ref 8.9–10.3)
Chloride: 106 mmol/L (ref 98–111)
Creatinine, Ser: 0.87 mg/dL (ref 0.44–1.00)
GFR, Estimated: >60 mL/min (ref >60)
Glucose, Bld: 131 mg/dL — ABNORMAL HIGH (ref 70–99)
Potassium: 3.1 mmol/L — ABNORMAL LOW (ref 3.5–5.1)
Sodium: 144 mmol/L (ref 135–145)
Total Bilirubin: 0.4 mg/dL (ref 0.3–1.2)
Total Protein: 5.9 g/dL — ABNORMAL LOW (ref 6.5–8.1)

## 2022-04-13 LAB — CULTURE, BLOOD (ROUTINE X 2)
Culture: NO GROWTH
Culture: NO GROWTH
Special Requests: ADEQUATE
Special Requests: ADEQUATE

## 2022-04-13 LAB — MAGNESIUM: Magnesium: 1.5 mg/dL — ABNORMAL LOW (ref 1.7–2.4)

## 2022-04-13 LAB — MRSA NEXT GEN BY PCR, NASAL: MRSA by PCR Next Gen: NOT DETECTED

## 2022-04-13 MED ORDER — LOPERAMIDE HCL 2 MG PO CAPS
2.0000 mg | ORAL_CAPSULE | ORAL | Status: DC | PRN
  Administered 2022-04-13 – 2022-04-16 (×4): 2 mg via ORAL
  Filled 2022-04-13 (×4): qty 1

## 2022-04-13 MED ORDER — AMLODIPINE BESYLATE 5 MG PO TABS
5.0000 mg | ORAL_TABLET | Freq: Every day | ORAL | Status: DC
  Administered 2022-04-13 – 2022-04-18 (×7): 5 mg via ORAL
  Filled 2022-04-13 (×8): qty 1

## 2022-04-13 MED ORDER — ENOXAPARIN SODIUM 40 MG/0.4ML IJ SOSY
40.0000 mg | PREFILLED_SYRINGE | INTRAMUSCULAR | Status: DC
  Administered 2022-04-13 – 2022-04-17 (×5): 40 mg via SUBCUTANEOUS
  Filled 2022-04-13 (×4): qty 0.4

## 2022-04-13 MED ORDER — POTASSIUM CHLORIDE CRYS ER 20 MEQ PO TBCR
40.0000 meq | EXTENDED_RELEASE_TABLET | Freq: Once | ORAL | Status: AC
  Administered 2022-04-13: 40 meq via ORAL
  Filled 2022-04-13: qty 2

## 2022-04-13 MED ORDER — MAGNESIUM SULFATE IN D5W 1-5 GM/100ML-% IV SOLN
1.0000 g | Freq: Once | INTRAVENOUS | Status: DC
  Filled 2022-04-13: qty 100

## 2022-04-13 MED ORDER — MAGNESIUM SULFATE 2 GM/50ML IV SOLN
2.0000 g | Freq: Once | INTRAVENOUS | Status: AC
  Administered 2022-04-13: 2 g via INTRAVENOUS
  Filled 2022-04-13: qty 50

## 2022-04-13 MED ORDER — PNEUMOCOCCAL 20-VAL CONJ VACC 0.5 ML IM SUSY
0.5000 mL | PREFILLED_SYRINGE | INTRAMUSCULAR | Status: DC
  Filled 2022-04-13: qty 0.5

## 2022-04-13 MED ORDER — LIDOCAINE 4 % EX CREA
TOPICAL_CREAM | Freq: Every day | CUTANEOUS | Status: DC | PRN
  Administered 2022-04-13 – 2022-04-17 (×4): 1 via TOPICAL
  Filled 2022-04-13 (×2): qty 5

## 2022-04-13 NOTE — Evaluation (Signed)
Physical Therapy Evaluation ?Patient Details ?Name: Jordan Lane ?MRN: [id number] ?DOB: 1946-09-20 ?Today's Date: 04/13/2022 ? ?History of Present Illness ? Patient is a 76 y/o female who presents on 5/10 with AMS due to sepsis secondary to diverticulitis, on 5/12 found to be in complete heart block s/p temp pacing wires placed. Plan for permanent pacemaker 5/18. PMH includes HTN. breast ca, bil TKR.  ?Clinical Impression ? Pt is independent with ADLs/IADLs and walking PTA; lives with spouse. PT evaluation performed at bed level as pt on bedrest due to temporary pacer. MD consult requesting HEP program for patient until permanent pacemaker is placed on Thursday 5/18. Instructed pt in HEP program provided via [place] and provided red theraband. Emphasized importance of not using RUE due to placement of temp pacer. Educated pt on sternal precautions in anticipation of surgery planned for later this week. Noted to have some mild confusion asking questions related to situation and what happened to her. Pt motivated to get home with support of spouse. Will follow up post permanent pacemaker placement to assess OOB mobility and help determine appropriate disposition and follow up. ?   ? ?Recommendations for follow up therapy are one component of a multi-disciplinary discharge planning process, led by the attending physician.  Recommendations may be updated based on patient status, additional functional criteria and insurance authorization. ? ?Follow Up Recommendations Home health PT (pending re-evaluation post permanent pacemaker) ? ?  ?Assistance Recommended at Discharge Frequent or constant Supervision/Assistance  ?Patient can return home with the following ? A little help with walking and/or transfers;A little help with bathing/dressing/bathroom;Assistance with cooking/housework;Help with stairs or ramp for entrance;Assist for transportation ? ?  ?Equipment Recommendations None recommended by PT  ?Recommendations  for Other Services ?    ?  ?Functional Status Assessment Patient has had a recent decline in their functional status and demonstrates the ability to make significant improvements in function in a reasonable and predictable amount of time.  ? ?  ?Precautions / Restrictions Precautions ?Precautions: Fall;Other (comment) ?Precaution Comments: temp pacer ?Restrictions ?Weight Bearing Restrictions: No ?Other Position/Activity Restrictions: Limited AROM to RUE due to temp pacer  ? ?  ? ?Mobility ? Bed Mobility ?  ?  ?  ?  ?  ?  ?  ?General bed mobility comments: Pt on bedrest due to temporary pacemaker but performed bed level eval as well as provided HEP. ?  ? ?Transfers ?  ?  ?  ?  ?  ?  ?  ?  ?  ?  ?  ? ?Ambulation/Gait ?  ?  ?  ?  ?  ?  ?  ?  ? ?Stairs ?  ?  ?  ?  ?  ? ?Wheelchair Mobility ?  ? ?Modified Rankin (Stroke Patients Only) ?  ? ?  ? ?Balance   ?  ?  ?  ?  ?  ?  ?  ?  ?  ?  ?  ?  ?  ?  ?  ?  ?  ?  ?   ? ? ? ?Pertinent Vitals/Pain Pain Assessment ?Pain Assessment: No/denies pain  ? ? ?Home Living Family/patient expects to be discharged to:: Private residence ?Living Arrangements: Spouse/significant other ?Available Help at Discharge: Family;Available 24 hours/day ?Type of Home: House ?Home Access: Stairs to enter ?Entrance Stairs-Rails: Right;Left ?Entrance Stairs-Number of Steps: 3 ?Alternate Level Stairs-Number of Steps: 1 flight ?Home Layout: Two level;Able to live on main level with bedroom/bathroom ?Home Equipment: Conservation officer, nature (2  wheels);Wheelchair - manual;Shower seat;Hand held shower head;Grab bars - tub/shower ?   ?  ?Prior Function Prior Level of Function : Independent/Modified Independent ?  ?  ?  ?  ?  ?  ?Mobility Comments: Does not drive, does some IADLs. No falls in last 6 months ?ADLs Comments: INdependent ?  ? ? ?Hand Dominance  ? Dominant Hand: Right ? ?  ?Extremity/Trunk Assessment  ? Upper Extremity Assessment ?Upper Extremity Assessment: Defer to OT evaluation ?  ? ?Lower Extremity  Assessment ?Lower Extremity Assessment: Overall WFL for tasks assessed (able to perform SLR against gravity) ?  ? ?Cervical / Trunk Assessment ?Cervical / Trunk Assessment: Normal  ?Communication  ? Communication: No difficulties  ?Cognition Arousal/Alertness: Awake/alert ?Behavior During Therapy: [facility] for tasks assessed/performed ?Overall Cognitive Status: Impaired/Different from baseline ?Area of Impairment: Memory, Orientation, Awareness ?  ?  ?  ?  ?  ?  ?  ?  ?Orientation Level: Disoriented to, Situation ?  ?Memory: Decreased short-term memory ?  ?  ?Awareness: Intellectual ?  ?General Comments: Family reports some mild confusion, but oriented x3, not to situation. "Not sure how i got here or what is happening to me." "How did I lose half my lung?" ?  ?  ? ?  ?General Comments General comments (skin integrity, edema, etc.): Spouse and sister present during session. VSS on RA. ? ?  ?Exercises General Exercises - Upper Extremity ?Shoulder Flexion: AROM, Left, 5 reps, Theraband, Supine ?Theraband Level (Shoulder Flexion): Level 2 (Red) ?Elbow Flexion: Strengthening, Left, 5 reps, Theraband ?Theraband Level (Elbow Flexion): Level 2 (Red) ?Elbow Extension: Strengthening, Left, Supine, 5 reps, Theraband ?Theraband Level (Elbow Extension): Level 2 (Red) ?General Exercises - Lower Extremity ?Ankle Circles/Pumps: AROM, Both, 10 reps, Supine ?Quad Sets: AROM, Both, 10 reps, Supine ?Gluteal Sets: Strengthening, Both, 10 reps, Supine ?Heel Slides: AROM, Both, 5 reps, Supine ?Hip ABduction/ADduction: AROM, Both, 5 reps, Supine ?Straight Leg Raises: AROM, 5 reps, Both, Supine ?Other Exercises ?Other Exercises: shoulder horizontal abduction red theraband on LUE x5 ?Other Exercises: left SH IR/ER with red theraband  ? ?Assessment/Plan  ?  ?PT Assessment Patient needs continued PT services  ?PT Problem List Decreased strength;Decreased mobility;Decreased cognition ? ?   ?  ?PT Treatment Interventions Therapeutic exercise;Gait  training;Balance training;Patient/family education;Therapeutic activities;Functional mobility training;Stair training;DME instruction   ? ?PT Goals (Current goals can be found in the Care Plan section)  ?Acute Rehab PT Goals ?Patient Stated Goal: to get up and move ?PT Goal Formulation: With patient ?Time For Goal Achievement: 04/27/22 ?Potential to Achieve Goals: Fair ? ?  ?Frequency Min 3X/week ?  ? ? ?Co-evaluation   ?  ?  ?  ?  ? ? ?  ?AM-PAC PT "6 Clicks" Mobility  ?Outcome Measure Help needed turning from your back to your side while in a flat bed without using bedrails?: A Lot ?Help needed moving from lying on your back to sitting on the side of a flat bed without using bedrails?: A Lot ?Help needed moving to and from a bed to a chair (including a wheelchair)?: A Lot ?Help needed standing up from a chair using your arms (e.g., wheelchair or bedside chair)?: A Lot ?Help needed to walk in hospital room?: A Little ?Help needed climbing 3-5 steps with a railing? : A Lot ?6 Click Score: 13 ? ?  ?End of Session   ?Activity Tolerance: Patient tolerated treatment well ?Patient left: in bed;with call bell/phone within reach;with family/visitor present ?Nurse Communication: Mobility status ?PT  Visit Diagnosis: Muscle weakness (generalized) (M62.81) ?  ? ?Time: 9030-0923 ?PT Time Calculation (min) (ACUTE ONLY): 25 min ? ? ?Charges:   PT Evaluation ?$PT Eval Moderate Complexity: 1 Mod ?PT Treatments ?$Therapeutic Exercise: 8-22 mins ?  ?   ? ? ?Marisa Severin, PT, DPT ?Acute Rehabilitation Services ?Secure chat preferred ?Office [phone] ? ? ? ? ?[place] ?04/13/2022, 2:13 PM ? ?

## 2022-04-13 NOTE — Progress Notes (Signed)
?PROGRESS NOTE ? ?Jordan Lane JOA:[id number] DOB: [birth year] DOA: 04/08/2022 ?PCP: Lavone Orn, MD ? ? LOS: 4 days  ? ?Brief Narrative / Interim history: ?76 year old female with HTN, CVA, GERD, IBS, who comes into the hospital with complaints of altered mental status.  She was seen in the ED for altered mental status on 04/07/2022 as a code stroke.  Neurology consulted at that time.  An MRI was negative for CVA and EEG was without seizure activity, and she was sent home on Keflex for presumed UTI.  She continued to be off of her baseline, was more confused and she was brought back.  She was febrile and hypotensive, also found to be bradycardic.  CT scan of the abdomen pelvis showed acute diverticulitis.  She was placed on antibiotics and admitted to the hospital.  Due to presence of bradycardia on admission, cardiology consulted. ? ?Hospital events: ?5/10 presents to the ED for persistent confusion, found to be febrile and have diverticulitis, admitted to the hospital ?5/12 developed complete heart block, transferred to the ICU, temporary pacing wire placed ? ?Subjective / 24h Interval events: ?Less confusion today.  She knows where she is and why she is here.  Daughter is at bedside. ? ?Assesement and Plan: ?Principal Problem: ?  Diverticulitis large intestine ?Active Problems: ?  Essential hypertension ?  Sinus bradycardia ?  HLD (hyperlipidemia) ?  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  Heart block AV complete ([place]) ?  Acute metabolic encephalopathy ? ? ?Principal problem ?Sepsis due to acute diverticulitis-patient met sepsis criteria on admission with significant leukocytosis, fever, and a source as was evident on the CT scan of the abdomen and pelvis.  She was placed on Zosyn, sepsis physiology appears to be improving.  White count has normalized, abdominal tenderness improving.  Seems to be tolerating a soft diet ?  ?Active problems ?Complete heart block-on 5/12  AM patient developed complete heart block.  She was moved to the ICU, and underwent emergent temporary pacemaker placement. Discussed with Dr. Juleen China with infectious disease and case reviewed.  He recommends to wait on the pacemaker is much as possible and ensure clinical defervescence, ideally to complete treatment of 10 days, however it could be placed 1 to 2 days earlier if she is clinically stable.  There is however concern from cardiology team about the duration that she will need to be with a temporary pacer if she is to wait and complete the full 10-day course, discussed with Dr. Curt Bears.  She will have the pacemaker placed this Thursday ? ?Prior CVA-continue statin, Plavix.  She was just worked up couple of days in the ED and was negative for an acute CVA. ?  ?Essential hypertension-continue home medications as below ? ?Acute metabolic encephalopathy-symptoms resolved ? ?Acute urinary retention-due to immobility, ICU, had to place Foley.  She is much more alert.  Voiding trial today ? ?Hyperlipidemia-continue statin ?  ?Hypokalemia, hypomagnesemia-continue to replete today ?  ?Scheduled Meds: ? atorvastatin  20 mg Oral QPM  ? Chlorhexidine Gluconate Cloth  6 each Topical Daily  ? clopidogrel  75 mg Oral QPM  ? enoxaparin (LOVENOX) injection  40 mg Subcutaneous Q24H  ? escitalopram  20 mg Oral Daily  ? fentaNYL (SUBLIMAZE) injection  50 mcg Intravenous Once  ? pantoprazole  40 mg Oral Daily  ? pneumococcal 20-valent conjugate vaccine  0.5 mL Intramuscular Tomorrow-1000  ? QUEtiapine  25 mg Oral QHS  ? senna  1 tablet  Oral BID  ? ?Continuous Infusions: ? sodium chloride Stopped (04/11/22 0011)  ? piperacillin-tazobactam (ZOSYN)  IV 3.375 g (04/13/22 0511)  ? ?PRN Meds:.sodium chloride, acetaminophen **OR** acetaminophen, diphenhydrAMINE, fluticasone, ipratropium-albuterol, ketorolac ? ?Diet Orders (From admission, onward)  ? ?  Start     Ordered  ? 04/12/22 1030  DIET SOFT Room service appropriate? Yes; Fluid  consistency: Thin  Diet effective now       ?Question Answer Comment  ?Room service appropriate? Yes   ?Fluid consistency: Thin   ?  ? 04/12/22 1029  ? ?  ?  ? ?  ? ? ?DVT prophylaxis: enoxaparin (LOVENOX) injection 40 mg Start: 04/09/22 1600 ? ? ?Lab Results  ?Component Value Date  ? PLT 264 04/13/2022  ? ? ?  Code Status: Full Code ? ?Family Communication: Daughter and husband present at bedside ? ?Status is: Inpatient ? ?Remains inpatient appropriate because: Severity of illness ? ? ?Level of care: ICU ? ?Consultants:  ?Cardiology - EP ? ?Procedures:  ?none ? ?Microbiology  ?Blood cultures 5/11 >NGTD ? ?Antimicrobials: ?Zosyn 5/11 ? ? ?Objective: ?Vitals:  ? 04/13/22 0900 04/13/22 0910 04/13/22 1000 04/13/22 1104  ?BP:  (!) 144/89 132/89   ?Pulse: 80 79 79   ?Resp: 17 (!) 23 20   ?Temp:    98.1 ?F (36.7 ?C)  ?TempSrc:    Oral  ?SpO2: 94% 94% 95%   ?Weight:      ?Height:      ? ? ?Intake/Output Summary (Last 24 hours) at 04/13/2022 1111 ?Last data filed at 04/13/2022 1000 ?Gross per 24 hour  ?Intake 828.96 ml  ?Output 1810 ml  ?Net -981.04 ml  ? ? ?Wt Readings from Last 3 Encounters:  ?04/12/22 68 kg  ?04/07/22 68 kg  ?03/31/22 69.4 kg  ? ? ?Examination: ? ?Constitutional: NAD ?Eyes: lids and conjunctivae normal, no scleral icterus ?ENMT: mmm ?Neck: normal, supple ?Respiratory: clear to auscultation bilaterally, no wheezing, no crackles. Normal respiratory effort.  ?Cardiovascular: Regular rate and rhythm, no murmurs / rubs / gallops. No LE edema. ?Abdomen: soft, no distention, no tenderness. Bowel sounds positive.  ?Skin: no rashes ?Neurologic: no focal deficits, equal strength ? ? ?Data Reviewed: I have independently reviewed following labs and imaging studies  ? ?CBC ?Recent Labs  ?Lab 04/07/22 ?2144 04/07/22 ?2147 04/08/22 ?2137 04/08/22 ?2256 04/10/22 ?0301 04/11/22 ?8250 04/12/22 ?0134 04/13/22 ?0109  ?WBC 18.4*  --  20.3*  --  15.0* 12.1* 9.3 9.3  ?HGB 14.0   < > 13.9 13.9 10.6* 10.8* 10.8* 11.1*  ?HCT 41.7    < > 40.0 41.0 31.3* 32.1* 30.8* 32.0*  ?PLT 265  --  298  --  236 239 231 264  ?MCV 94.6  --  92.0  --  92.3 92.5 90.9 90.9  ?MCH 31.7  --  32.0  --  31.3 31.1 31.9 31.5  ?MCHC 33.6  --  34.8  --  33.9 33.6 35.1 34.7  ?RDW 12.7  --  12.7  --  12.5 12.5 12.4 12.4  ?LYMPHSABS 3.4  --  2.2  --   --   --   --   --   ?MONOABS 1.8*  --  1.9*  --   --   --   --   --   ?EOSABS 0.1  --  0.0  --   --   --   --   --   ?BASOSABS 0.1  --  0.1  --   --   --   --   --   ? < > =  values in this interval not displayed.  ? ? ? ?Recent Labs  ?Lab 04/07/22 ?2144 04/07/22 ?2147 04/07/22 ?2159 04/08/22 ?2137 04/08/22 ?2256 04/08/22 ?2310 04/09/22 ?0050 04/09/22 ?1011 04/10/22 ?0301 04/11/22 ?6226 04/12/22 ?0134 04/13/22 ?0109  ?NA 138   < >  --  141   < >  --   --  140 138 143 141 144  ?K 3.5   < >  --  3.2*   < >  --   --  3.3* 3.4* 3.4* 3.5 3.1*  ?CL 103   < >  --  104  --   --   --  108 106 108 110 106  ?CO2 24  --   --  29  --   --   --  _0 ?GLUCOSE 109*   < >  --  124*  --   --   --  115* 105* 79 103* 131*  ?BUN 8   < >  --  10  --   --   --  8 8 7* 7* 7*  ?CREATININE 0.84   < >  --  0.92  --   --   --  0.76 0.96 0.85 0.83 0.87  ?CALCIUM 9.4  --   --  9.1  --   --   --  8.5* 8.3* 8.5* 8.3* 8.4*  ?AST 24  --   --  18  --   --   --   --  20 21  --  24  ?ALT 14  --   --  12  --   --   --   --  13 13  --  14  ?ALKPHOS 67  --   --  64  --   --   --   --  52 53  --  51  ?BILITOT 0.9  --   --  1.9*  --   --   --   --  2.3* 1.3*  --  0.4  ?ALBUMIN 3.8  --   --  3.4*  --   --   --   --  2.8* 2.6*  --  2.6*  ?MG  --   --   --   --   --   --   --   --  1.3* 1.8  --  1.5*  ?LATICACIDVEN  --   --   --   --   --  1.2 1.4  --   --   --   --   --   ?INR  --   --  1.1 1.2  --   --   --   --   --   --   --   --   ?TSH  --   --   --   --   --   --   --   --   --  1.974  --   --   ?AMMONIA  --   --   --  20  --   --   --   --   --   --   --   --   ? < > = values in this interval not displayed.   ? ? ? ?------------------------------------------------------------------------------------------------------------------ ?No results for input(s): CHOL, HDL, LDLCALC, TRIG, CHOLHDL, LDLDIRECT in the last 72 hours. ? ?Lab Results  ?Component V

## 2022-04-13 NOTE — Progress Notes (Signed)
Patient request a break from BP cuff for a few hours to be able to sleep. Will restart BP curr at handoff shift change. ?Erling Conte, RN ?

## 2022-04-13 NOTE — Consult Note (Signed)
? ?  [facility] CM Inpatient Consult ? ? ?04/13/2022 ? ?[name] ?Feb 11, 1946 ?[id number] ? ?[place] [facility] [ACO] Patient: [employer] ? ?Primary Care Provider:  Lavone Orn, MD, with [facility] [place] ? ? ?Patient is currently showing as active with [facility] for chronic disease management services.  Patient has been engaged by a [place].  Our community based plan of care has focused on disease management and community resource support.   ? ?Plan: Patient has an Embedded provider with a Chronic Care Management team and program available. Will follow for post hospital needs for appropriate transition of care. ? ? Of note, [facility] Care Management services does not replace or interfere with any services that are needed or arranged by inpatient [facility] care management team.  For additional questions or referrals please contact: ? ?Natividad Brood, RN BSN CCM ?[facility] Liaison ? [phone] business mobile phone ?Toll free office [phone]  ?Fax number: [phone] ?[email or web address] ? ? ? ? ? ?

## 2022-04-13 NOTE — Progress Notes (Addendum)
? ?Electrophysiology Rounding Note ? ?Patient Name: Jordan Lane ?Date of Encounter: 04/13/2022 ? ?Primary Cardiologist: None ?Electrophysiologist: None ? ? ?Subjective  ? ?No events overnight. Difficulty sleeping with constant BPs and alarms. ? ?Inpatient Medications  ?  ?Scheduled Meds: ? atorvastatin  20 mg Oral QPM  ? Chlorhexidine Gluconate Cloth  6 each Topical Daily  ? clopidogrel  75 mg Oral QPM  ? enoxaparin (LOVENOX) injection  40 mg Subcutaneous Q24H  ? escitalopram  20 mg Oral Daily  ? fentaNYL (SUBLIMAZE) injection  50 mcg Intravenous Once  ? pantoprazole  40 mg Oral Daily  ? pneumococcal 20-valent conjugate vaccine  0.5 mL Intramuscular Tomorrow-1000  ? potassium chloride  40 mEq Oral Once  ? QUEtiapine  25 mg Oral QHS  ? senna  1 tablet Oral BID  ? ?Continuous Infusions: ? sodium chloride Stopped (04/11/22 0011)  ? magnesium sulfate bolus IVPB 2 g (04/13/22 7035)  ? piperacillin-tazobactam (ZOSYN)  IV 3.375 g (04/13/22 0511)  ? ?PRN Meds: ?sodium chloride, acetaminophen **OR** acetaminophen, diphenhydrAMINE, fluticasone, ipratropium-albuterol, ketorolac  ? ?Vital Signs  ?  ?Vitals:  ? 04/13/22 0300 04/13/22 0400 04/13/22 0500 04/13/22 0600  ?BP:  (!) 110/100    ?Pulse: 80 79 80 80  ?Resp: (!) '23 18 12 14  '$ ?Temp: 98.2 ?F (36.8 ?C)     ?TempSrc: Oral     ?SpO2: 93% 90% 93% 99%  ?Weight:      ?Height:      ? ? ?Intake/Output Summary (Last 24 hours) at 04/13/2022 0710 ?Last data filed at 04/13/2022 0600 ?Gross per 24 hour  ?Intake 412.02 ml  ?Output 3750 ml  ?Net -3337.98 ml  ? ?Filed Weights  ? 04/12/22 2148  ?Weight: 68 kg  ? ? ?Physical Exam  ?  ?GEN- The patient is well appearing, alert and oriented x 3 today.   ?Head- normocephalic, atraumatic ?Eyes-  Sclera clear, conjunctiva pink ?Ears- hearing intact ?Oropharynx- clear ?Neck- supple ?Lungs- Clear to ausculation bilaterally, normal work of breathing ?Heart- Regular rate and rhythm, no murmurs, rubs or gallops ?GI- soft, NT, ND, +  BS ?Extremities- no clubbing or cyanosis. No edema ?Skin- no rash or lesion ?Psych- euthymic mood, full affect ?Neuro- strength and sensation are intact ? ?Labs  ?  ?CBC ?Recent Labs  ?  04/12/22 ?0134 04/13/22 ?0109  ?WBC 9.3 9.3  ?HGB 10.8* 11.1*  ?HCT 30.8* 32.0*  ?MCV 90.9 90.9  ?PLT 231 264  ? ?Basic Metabolic Panel ?Recent Labs  ?  04/11/22 ?0093 04/12/22 ?0134 04/13/22 ?0109  ?NA 143 141 144  ?K 3.4* 3.5 3.1*  ?CL 108 110 106  ?CO2 '22 24 30  '$ ?GLUCOSE 79 103* 131*  ?BUN 7* 7* 7*  ?CREATININE 0.85 0.83 0.87  ?CALCIUM 8.5* 8.3* 8.4*  ?MG 1.8  --  1.5*  ? ?Liver Function Tests ?Recent Labs  ?  04/11/22 ?8182 04/13/22 ?0109  ?AST 21 24  ?ALT 13 14  ?ALKPHOS 53 51  ?BILITOT 1.3* 0.4  ?PROT 5.8* 5.9*  ?ALBUMIN 2.6* 2.6*  ? ?No results for input(s): LIPASE, AMYLASE in the last 72 hours. ?Cardiac Enzymes ?No results for input(s): CKTOTAL, CKMB, CKMBINDEX, TROPONINI in the last 72 hours. ? ? ?Telemetry  ?  ?V pacing at 80 (personally reviewed) ? ?Radiology  ?  ?No results found. ? ?Patient Profile  ?   ?76yo with HTN, GERD, arthritis, HLD, stroke in 2016 who is admitted with diverticulitis and complete heart block now s/p temporary pacemaker placement. ? ?  Assessment & Plan  ?  ?CHB ?Intermittent escape at VVI 30. Sinus rhythm underlying. ?Temp pacer with capture has been stable. ?Adley Mazurowski continue to monitor rhythm while diverticulitis is treated. ?Timing of implant TBD. Tentatively plan for Thursday 5/18. ? ?2. Diverticulitis ?3. Nausea ?Per primary team. ?Sheccid Lahmann complete ABx over the weekend. ? ?For questions or updates, please contact [place] ?Please consult [email or web address] for contact info under Cardiology/STEMI. ? ?Signed, ?[name], PA-C  ?04/13/2022, 7:10 AM  ? ?I have seen and examined this patient with [name].  Agree with above, note added to reflect my findings.  Feeling much improved.  Less confusion through the weekend.  Is tolerating foods.  Remains in complete heart block. ? ?GEN: Well  nourished, well developed, in no acute distress  ?HEENT: normal  ?Neck: no JVD, carotid bruits, or masses ?Cardiac: RRR; no murmurs, rubs, or gallops,no edema  ?Respiratory:  clear to auscultation bilaterally, normal work of breathing ?GI: soft, nontender, nondistended, + BS ?MS: no deformity or atrophy  ?Skin: warm and dry ?Neuro:  Strength and sensation are intact ?Psych: euthymic mood, full affect  ? ?Complete heart block: Ventricular escape at 30 bpm intermittently.  Sinus rhythm underlying.  Temp pacer capture has been stable.  We Jayda White tentatively plan for pacemaker implant 04/16/2022. ?Diverticulitis: Antibiotics per primary team. ? ?Sula Fetterly M. Zakeya Junker MD ?04/13/2022 ?1:58 PM  ?

## 2022-04-14 ENCOUNTER — Other Ambulatory Visit: Payer: Medicare HMO

## 2022-04-14 ENCOUNTER — Ambulatory Visit: Payer: Self-pay

## 2022-04-14 DIAGNOSIS — I442 Atrioventricular block, complete: Secondary | ICD-10-CM | POA: Diagnosis not present

## 2022-04-14 DIAGNOSIS — K5732 Diverticulitis of large intestine without perforation or abscess without bleeding: Secondary | ICD-10-CM | POA: Diagnosis not present

## 2022-04-14 LAB — CBC
HCT: 35.6 % — ABNORMAL LOW (ref 36.0–46.0)
Hemoglobin: 12 g/dL (ref 12.0–15.0)
MCH: 30.8 pg (ref 26.0–34.0)
MCHC: 33.7 g/dL (ref 30.0–36.0)
MCV: 91.3 fL (ref 80.0–100.0)
Platelets: 297 10*3/uL (ref 150–400)
RBC: 3.9 MIL/uL (ref 3.87–5.11)
RDW: 12.4 % (ref 11.5–15.5)
WBC: 10.4 10*3/uL (ref 4.0–10.5)
nRBC: 0 % (ref 0.0–0.2)

## 2022-04-14 LAB — MAGNESIUM: Magnesium: 1.8 mg/dL (ref 1.7–2.4)

## 2022-04-14 LAB — BASIC METABOLIC PANEL
Anion gap: 10 (ref 5–15)
BUN: <5 mg/dL — ABNORMAL LOW (ref 8–23)
CO2: 24 mmol/L (ref 22–32)
Calcium: 8.6 mg/dL — ABNORMAL LOW (ref 8.9–10.3)
Chloride: 109 mmol/L (ref 98–111)
Creatinine, Ser: 0.75 mg/dL (ref 0.44–1.00)
GFR, Estimated: >60 mL/min (ref >60)
Glucose, Bld: 109 mg/dL — ABNORMAL HIGH (ref 70–99)
Potassium: 3.4 mmol/L — ABNORMAL LOW (ref 3.5–5.1)
Sodium: 143 mmol/L (ref 135–145)

## 2022-04-14 MED ORDER — MELATONIN 5 MG PO TABS
5.0000 mg | ORAL_TABLET | Freq: Once | ORAL | Status: AC | PRN
  Administered 2022-04-15: 5 mg via ORAL
  Filled 2022-04-14 (×2): qty 1

## 2022-04-14 MED ORDER — POTASSIUM CHLORIDE CRYS ER 20 MEQ PO TBCR
40.0000 meq | EXTENDED_RELEASE_TABLET | Freq: Once | ORAL | Status: AC
  Administered 2022-04-14: 40 meq via ORAL
  Filled 2022-04-14: qty 2

## 2022-04-14 MED ORDER — ALPRAZOLAM 0.25 MG PO TABS
0.2500 mg | ORAL_TABLET | Freq: Two times a day (BID) | ORAL | Status: DC | PRN
  Administered 2022-04-14 – 2022-04-17 (×6): 0.25 mg via ORAL
  Filled 2022-04-14 (×6): qty 1

## 2022-04-14 NOTE — Progress Notes (Signed)
Pt becoming increasingly agitated and confused, verbally aggressive to staff and family. Pulling at temporary pacer, trying to climb out of bed. Dr. Cruzita Lederer notified, see [facility] for ordered PRN.  ?

## 2022-04-14 NOTE — Progress Notes (Addendum)
? ?Electrophysiology Rounding Note ? ?Patient Name: Jordan Lane ?Date of Encounter: 04/14/2022 ? ?Primary Cardiologist: None ?Electrophysiologist: None ? ? ?Subjective  ? ?NAEO. Appetite is OK. No pain when eating. Abdominal pain improving.  ? ?Inpatient Medications  ?  ?Scheduled Meds: ? amLODipine  5 mg Oral Daily  ? atorvastatin  20 mg Oral QPM  ? Chlorhexidine Gluconate Cloth  6 each Topical Daily  ? clopidogrel  75 mg Oral QPM  ? enoxaparin (LOVENOX) injection  40 mg Subcutaneous Q24H  ? escitalopram  20 mg Oral Daily  ? fentaNYL (SUBLIMAZE) injection  50 mcg Intravenous Once  ? pantoprazole  40 mg Oral Daily  ? pneumococcal 20-valent conjugate vaccine  0.5 mL Intramuscular Tomorrow-1000  ? QUEtiapine  25 mg Oral QHS  ? senna  1 tablet Oral BID  ? ?Continuous Infusions: ? sodium chloride Stopped (04/11/22 0011)  ? piperacillin-tazobactam (ZOSYN)  IV 12.5 mL/hr at 04/14/22 0700  ? ?PRN Meds: ?sodium chloride, acetaminophen **OR** acetaminophen, diphenhydrAMINE, fluticasone, ipratropium-albuterol, lidocaine, loperamide  ? ?Vital Signs  ?  ?Vitals:  ? 04/14/22 0500 04/14/22 0600 04/14/22 0635 04/14/22 0700  ?BP: 138/74 (S) (!) 178/87 (S) (!) 147/102 (!) 135/93  ?Pulse: 79   80  ?Resp: 20   17  ?Temp:      ?TempSrc:      ?SpO2: 93% 93%  94%  ?Weight:      ?Height:      ? ? ?Intake/Output Summary (Last 24 hours) at 04/14/2022 0719 ?Last data filed at 04/14/2022 0700 ?Gross per 24 hour  ?Intake 1099.81 ml  ?Output 1435 ml  ?Net -335.19 ml  ? ?Filed Weights  ? 04/12/22 2148 04/14/22 0413  ?Weight: 68 kg 73.4 kg  ? ? ?Physical Exam  ?  ?GEN- The patient is well appearing, alert and oriented x 3 today.   ?Head- normocephalic, atraumatic ?Eyes-  Sclera clear, conjunctiva pink ?Ears- hearing intact ?Oropharynx- clear ?Neck- supple ?Lungs- Clear to ausculation bilaterally, normal work of breathing ?Heart- Regular rate and rhythm, no murmurs, rubs or gallops ?GI- soft, NT, ND, + BS ?Extremities- no clubbing or cyanosis.  No edema ?Skin- no rash or lesion ?Psych- euthymic mood, full affect ?Neuro- strength and sensation are intact ? ?Labs  ?  ?CBC ?Recent Labs  ?  04/13/22 ?0109 04/14/22 ?0329  ?WBC 9.3 10.4  ?HGB 11.1* 12.0  ?HCT 32.0* 35.6*  ?MCV 90.9 91.3  ?PLT 264 297  ? ?Basic Metabolic Panel ?Recent Labs  ?  04/13/22 ?0109 04/14/22 ?0329  ?NA 144 143  ?K 3.1* 3.4*  ?CL 106 109  ?CO2 30 24  ?GLUCOSE 131* 109*  ?BUN 7* <5*  ?CREATININE 0.87 0.75  ?CALCIUM 8.4* 8.6*  ?MG 1.5* 1.8  ? ?Liver Function Tests ?Recent Labs  ?  04/13/22 ?0109  ?AST 24  ?ALT 14  ?ALKPHOS 51  ?BILITOT 0.4  ?PROT 5.9*  ?ALBUMIN 2.6*  ? ?No results for input(s): LIPASE, AMYLASE in the last 72 hours. ?Cardiac Enzymes ?No results for input(s): CKTOTAL, CKMB, CKMBINDEX, TROPONINI in the last 72 hours. ? ? ?Telemetry  ?  ?V pacing at 80 (personally reviewed) ? ?Radiology  ?  ?No results found. ? ?Patient Profile  ?   ?   ?76yo with HTN, GERD, arthritis, HLD, stroke in 2016 who is admitted with diverticulitis and complete heart block now s/p temporary pacemaker placement. ? ?Assessment & Plan  ?  ?CHB ?Intermittent escape at VVI 30. ?Temp pacer with capture has been stable. ?[name]  continue to monitor rhythm while diverticulitis is treated. ?Timing of implant TBD. Tentatively plan for Thursday 5/18. ?  ?2. Diverticulitis ?3. Nausea ?Per primary team. ?[name] complete ABx over this coming weekend.  ? ?For questions or updates, please contact [name] ?Please consult [email or web address] for contact info under Cardiology/STEMI. ? ?Signed, ?[name], PA-C  ?04/14/2022, 7:19 AM  ? ?I have seen and examined this patient with [name].  Agree with above, note added to reflect my findings.  Continues to be confused.  Remains in heart block.  No abdominal tenderness. ? ?GEN: Well nourished, well developed, in no acute distress  ?HEENT: normal  ?Neck: no JVD, carotid bruits, or masses ?Cardiac: RRR; no murmurs, rubs, or gallops,no edema  ?Respiratory:  clear  to auscultation bilaterally, normal work of breathing ?GI: soft, nontender, nondistended, + BS ?MS: no deformity or atrophy  ?Skin: warm and dry ?Neuro:  Strength and sensation are intact ?Psych: euthymic mood, full affect  ? ?Complete heart block: Status post temporary wire implant.  She [name] need permanent pacemaker implant.  We [name] plan for pacemaker implant on Thursday pending mental status. ? ?[name] M. Myson Levi MD ?04/14/2022 ?1:35 PM  ?

## 2022-04-14 NOTE — Progress Notes (Signed)
?PROGRESS NOTE ? ?Jordan Lane NLZ:[id number] DOB: January 04, 1946 DOA: 04/08/2022 ?PCP: Lavone Orn, MD ? ? LOS: 5 days  ? ?Brief Narrative / Interim history: ?76 year old female with HTN, CVA, GERD, IBS, who comes into the hospital with complaints of altered mental status.  She was seen in the ED for altered mental status on 04/07/2022 as a code stroke.  Neurology consulted at that time.  An MRI was negative for CVA and EEG was without seizure activity, and she was sent home on Keflex for presumed UTI.  She continued to be off of her baseline, was more confused and she was brought back.  She was febrile and hypotensive, also found to be bradycardic.  CT scan of the abdomen pelvis showed acute diverticulitis.  She was placed on antibiotics and admitted to the hospital.  Due to presence of bradycardia on admission, cardiology consulted. ? ?Hospital events: ?5/10 presents to the ED for persistent confusion, found to be febrile and have diverticulitis, admitted to the hospital ?5/12 developed complete heart block, transferred to the ICU, temporary pacing wire placed ? ?Subjective / 24h Interval events: ?Less confused this morning. Eating breakfast. No abdominal pain, no nausea/vomiting.  ? ?Assesement and Plan: ?Principal Problem: ?  Diverticulitis large intestine ?Active Problems: ?  Essential hypertension ?  Sinus bradycardia ?  HLD (hyperlipidemia) ?  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  Heart block AV complete ([place]) ?  Acute metabolic encephalopathy ? ? ?Principal problem ?Sepsis due to acute diverticulitis-patient met sepsis criteria on admission with significant leukocytosis, fever, and a source as was evident on the CT scan of the abdomen and pelvis.  She was placed on Zosyn, sepsis physiology appears to be improving.  WBC is normal. No tenderness on exam, significantly improved.  ?  ?Active problems ?Complete heart block-on 5/12 AM patient developed complete heart  block.  She was moved to the ICU, and underwent emergent temporary pacemaker placement. Discussed with Dr. Juleen China with infectious disease and case reviewed.  He recommends to wait on the pacemaker is much as possible and ensure clinical defervescence, ideally to complete treatment of 10 days (Saturday to end), however it could be placed 1 to 2 days earlier if she is clinically stable.  She will have the pacemaker placed this Thursday ? ?Prior CVA-continue statin, Plavix.  She was just worked up couple of days in the ED and was negative for an acute CVA. ?  ?Essential hypertension-continue home medications as below ? ?Acute metabolic encephalopathy-symptoms resolved ? ?Acute urinary retention-due to immobility, ICU, had to place Foley.  She is much more alert.  Voiding trial 5/15, but still appears to intermittently retain. Got I&O this morning, continue bladder scans. May need to put the Foley back in ? ?Hyperlipidemia-continue statin ?  ?Hypokalemia, hypomagnesemia-K 3.4, continue to replace. Mg 1.8 ?  ?Scheduled Meds: ? amLODipine  5 mg Oral Daily  ? atorvastatin  20 mg Oral QPM  ? Chlorhexidine Gluconate Cloth  6 each Topical Daily  ? clopidogrel  75 mg Oral QPM  ? enoxaparin (LOVENOX) injection  40 mg Subcutaneous Q24H  ? escitalopram  20 mg Oral Daily  ? fentaNYL (SUBLIMAZE) injection  50 mcg Intravenous Once  ? pantoprazole  40 mg Oral Daily  ? pneumococcal 20-valent conjugate vaccine  0.5 mL Intramuscular Tomorrow-1000  ? QUEtiapine  25 mg Oral QHS  ? senna  1 tablet Oral BID  ? ?Continuous Infusions: ? sodium chloride Stopped (04/11/22 0011)  ?  piperacillin-tazobactam (ZOSYN)  IV 12.5 mL/hr at 04/14/22 1000  ? ?PRN Meds:.sodium chloride, acetaminophen **OR** acetaminophen, ALPRAZolam, diphenhydrAMINE, fluticasone, ipratropium-albuterol, lidocaine, loperamide ? ?Diet Orders (From admission, onward)  ? ?  Start     Ordered  ? 04/12/22 1030  DIET SOFT Room service appropriate? Yes; Fluid consistency: Thin   Diet effective now       ?Question Answer Comment  ?Room service appropriate? Yes   ?Fluid consistency: Thin   ?  ? 04/12/22 1029  ? ?  ?  ? ?  ? ? ?DVT prophylaxis: enoxaparin (LOVENOX) injection 40 mg Start: 04/13/22 1800 ? ? ?Lab Results  ?Component Value Date  ? PLT 297 04/14/2022  ? ? ?  Code Status: Full Code ? ?Family Communication: Daughter and husband present at bedside ? ?Status is: Inpatient ? ?Remains inpatient appropriate because: Severity of illness ? ? ?Level of care: ICU ? ?Consultants:  ?Cardiology - EP ? ?Procedures:  ?none ? ?Microbiology  ?Blood cultures 5/11 >NGTD ? ?Antimicrobials: ?Zosyn 5/11 ? ? ?Objective: ?Vitals:  ? 04/14/22 0800 04/14/22 0803 04/14/22 0900 04/14/22 1000  ?BP: (!) 154/110     ?Pulse: 80  80 78  ?Resp: _0 ?Temp:  98.2 ?F (36.8 ?C)    ?TempSrc:  Oral    ?SpO2: 92%  91% 94%  ?Weight:      ?Height:      ? ? ?Intake/Output Summary (Last 24 hours) at 04/14/2022 1338 ?Last data filed at 04/14/2022 1000 ?Gross per 24 hour  ?Intake 268.77 ml  ?Output 1405 ml  ?Net -1136.23 ml  ? ? ?Wt Readings from Last 3 Encounters:  ?04/14/22 73.4 kg  ?04/07/22 68 kg  ?03/31/22 69.4 kg  ? ? ?Examination: ?Constitutional: NAD ?Eyes: lids and conjunctivae normal, no scleral icterus ?ENMT: mmm ?Neck: normal, supple ?Respiratory: clear to auscultation bilaterally, no wheezing, no crackles. Normal respiratory effort.  ?Cardiovascular: Regular rate and rhythm, no murmurs / rubs / gallops. No LE edema. ?Abdomen: soft, no distention, no tenderness. Bowel sounds positive.  ?Skin: no rashes ?Neurologic: no focal deficits, equal strength ? ? ?Data Reviewed: I have independently reviewed following labs and imaging studies  ? ?CBC ?Recent Labs  ?Lab 04/07/22 ?2144 04/07/22 ?2147 04/08/22 ?2137 04/08/22 ?2256 04/10/22 ?0301 04/11/22 ?2952 04/12/22 ?0134 04/13/22 ?0109 04/14/22 ?8413  ?WBC 18.4*  --  20.3*  --  15.0* 12.1* 9.3 9.3 10.4  ?HGB 14.0   < > 13.9   < > 10.6* 10.8* 10.8* 11.1* 12.0  ?HCT 41.7    < > 40.0   < > 31.3* 32.1* 30.8* 32.0* 35.6*  ?PLT 265  --  298  --  236 239 231 264 297  ?MCV 94.6  --  92.0  --  92.3 92.5 90.9 90.9 91.3  ?MCH 31.7  --  32.0  --  31.3 31.1 31.9 31.5 30.8  ?MCHC 33.6  --  34.8  --  33.9 33.6 35.1 34.7 33.7  ?RDW 12.7  --  12.7  --  12.5 12.5 12.4 12.4 12.4  ?LYMPHSABS 3.4  --  2.2  --   --   --   --   --   --   ?MONOABS 1.8*  --  1.9*  --   --   --   --   --   --   ?EOSABS 0.1  --  0.0  --   --   --   --   --   --   ?BASOSABS  0.1  --  0.1  --   --   --   --   --   --   ? < > = values in this interval not displayed.  ? ? ? ?Recent Labs  ?Lab 04/07/22 ?2144 04/07/22 ?2147 04/07/22 ?2159 04/08/22 ?2137 04/08/22 ?2256 04/08/22 ?2310 04/09/22 ?0050 04/09/22 ?1011 04/10/22 ?0301 04/11/22 ?6767 04/12/22 ?0134 04/13/22 ?0109 04/14/22 ?2094  ?NA 138   < >  --  141   < >  --   --    < > 138 143 141 144 143  ?K 3.5   < >  --  3.2*   < >  --   --    < > 3.4* 3.4* 3.5 3.1* 3.4*  ?CL 103   < >  --  104  --   --   --    < > 106 108 110 106 109  ?CO2 24  --   --  29  --   --   --    < > _0 ?GLUCOSE 109*   < >  --  124*  --   --   --    < > 105* 79 103* 131* 109*  ?BUN 8   < >  --  10  --   --   --    < > 8 7* 7* 7* <5*  ?CREATININE 0.84   < >  --  0.92  --   --   --    < > 0.96 0.85 0.83 0.87 0.75  ?CALCIUM 9.4  --   --  9.1  --   --   --    < > 8.3* 8.5* 8.3* 8.4* 8.6*  ?AST 24  --   --  18  --   --   --   --  20 21  --  24  --   ?ALT 14  --   --  12  --   --   --   --  13 13  --  14  --   ?ALKPHOS 67  --   --  64  --   --   --   --  52 53  --  51  --   ?BILITOT 0.9  --   --  1.9*  --   --   --   --  2.3* 1.3*  --  0.4  --   ?ALBUMIN 3.8  --   --  3.4*  --   --   --   --  2.8* 2.6*  --  2.6*  --   ?MG  --   --   --   --   --   --   --   --  1.3* 1.8  --  1.5* 1.8  ?LATICACIDVEN  --   --   --   --   --  1.2 1.4  --   --   --   --   --   --   ?INR  --   --  1.1 1.2  --   --   --   --   --   --   --   --   --   ?TSH  --   --   --   --   --   --   --   --   --  1.974  --   --   --    ?  AMMONIA  --   --   --  20  --   --   --   --   --   --   --   --   --   ? < > = values in this interval not displayed.  ? ? ? ?-------------------------------------------------------------------------------

## 2022-04-15 DIAGNOSIS — I442 Atrioventricular block, complete: Secondary | ICD-10-CM | POA: Diagnosis not present

## 2022-04-15 DIAGNOSIS — K5732 Diverticulitis of large intestine without perforation or abscess without bleeding: Secondary | ICD-10-CM | POA: Diagnosis not present

## 2022-04-15 DIAGNOSIS — I63012 Cerebral infarction due to thrombosis of left vertebral artery: Secondary | ICD-10-CM | POA: Diagnosis not present

## 2022-04-15 LAB — CBC
HCT: 35.2 % — ABNORMAL LOW (ref 36.0–46.0)
Hemoglobin: 11.9 g/dL — ABNORMAL LOW (ref 12.0–15.0)
MCH: 31.2 pg (ref 26.0–34.0)
MCHC: 33.8 g/dL (ref 30.0–36.0)
MCV: 92.4 fL (ref 80.0–100.0)
Platelets: 235 10*3/uL (ref 150–400)
RBC: 3.81 MIL/uL — ABNORMAL LOW (ref 3.87–5.11)
RDW: 12.8 % (ref 11.5–15.5)
WBC: 8.1 10*3/uL (ref 4.0–10.5)
nRBC: 0 % (ref 0.0–0.2)

## 2022-04-15 LAB — COMPREHENSIVE METABOLIC PANEL
ALT: 19 U/L (ref 0–44)
AST: 35 U/L (ref 15–41)
Albumin: 2.8 g/dL — ABNORMAL LOW (ref 3.5–5.0)
Alkaline Phosphatase: 54 U/L (ref 38–126)
Anion gap: 8 (ref 5–15)
BUN: <5 mg/dL — ABNORMAL LOW (ref 8–23)
CO2: 28 mmol/L (ref 22–32)
Calcium: 8.9 mg/dL (ref 8.9–10.3)
Chloride: 107 mmol/L (ref 98–111)
Creatinine, Ser: 0.73 mg/dL (ref 0.44–1.00)
GFR, Estimated: >60 mL/min (ref >60)
Glucose, Bld: 101 mg/dL — ABNORMAL HIGH (ref 70–99)
Potassium: 3.3 mmol/L — ABNORMAL LOW (ref 3.5–5.1)
Sodium: 143 mmol/L (ref 135–145)
Total Bilirubin: 0.4 mg/dL (ref 0.3–1.2)
Total Protein: 6.3 g/dL — ABNORMAL LOW (ref 6.5–8.1)

## 2022-04-15 MED ORDER — POTASSIUM CHLORIDE CRYS ER 20 MEQ PO TBCR
40.0000 meq | EXTENDED_RELEASE_TABLET | Freq: Once | ORAL | Status: AC
  Administered 2022-04-15: 40 meq via ORAL
  Filled 2022-04-15: qty 2

## 2022-04-15 MED ORDER — AMLODIPINE BESYLATE 5 MG PO TABS: 5.0000 mg | ORAL_TABLET | Freq: Once | ORAL | Status: AC

## 2022-04-15 MED ORDER — LIDOCAINE 5 % EX PTCH
1.0000 | MEDICATED_PATCH | CUTANEOUS | Status: DC
  Administered 2022-04-15 – 2022-04-18 (×4): 1 via TRANSDERMAL
  Filled 2022-04-15 (×4): qty 1

## 2022-04-15 MED ORDER — IRBESARTAN 300 MG PO TABS
150.0000 mg | ORAL_TABLET | Freq: Every day | ORAL | Status: DC
  Administered 2022-04-15 – 2022-04-18 (×4): 150 mg via ORAL
  Filled 2022-04-15 (×4): qty 1

## 2022-04-15 MED ORDER — HYDRALAZINE HCL 20 MG/ML IJ SOLN: 5.0000 mg | Freq: Four times a day (QID) | INTRAMUSCULAR | Status: DC | PRN

## 2022-04-15 NOTE — Progress Notes (Addendum)
? ?Electrophysiology Rounding Note ? ?Patient Name: Jordan Lane ?Date of Encounter: 04/15/2022 ? ?Primary Cardiologist: None ?Electrophysiologist: None ? ? ?Subjective  ? ?Somewhat agitated over night. BP high this am ? ?Inpatient Medications  ?  ?Scheduled Meds: ? amLODipine  5 mg Oral Daily  ? atorvastatin  20 mg Oral QPM  ? Chlorhexidine Gluconate Cloth  6 each Topical Daily  ? clopidogrel  75 mg Oral QPM  ? enoxaparin (LOVENOX) injection  40 mg Subcutaneous Q24H  ? escitalopram  20 mg Oral Daily  ? fentaNYL (SUBLIMAZE) injection  50 mcg Intravenous Once  ? pantoprazole  40 mg Oral Daily  ? pneumococcal 20-valent conjugate vaccine  0.5 mL Intramuscular Tomorrow-1000  ? potassium chloride  40 mEq Oral Once  ? QUEtiapine  25 mg Oral QHS  ? senna  1 tablet Oral BID  ? ?Continuous Infusions: ? sodium chloride Stopped (04/11/22 0011)  ? piperacillin-tazobactam (ZOSYN)  IV 12.5 mL/hr at 04/15/22 0700  ? ?PRN Meds: ?sodium chloride, acetaminophen **OR** acetaminophen, ALPRAZolam, diphenhydrAMINE, fluticasone, ipratropium-albuterol, lidocaine, loperamide, melatonin  ? ?Vital Signs  ?  ?Vitals:  ? 04/15/22 0600 04/15/22 0640 04/15/22 0705 04/15/22 0710  ?BP:  (!) 154/91 (!) 159/113 (!) 181/91  ?Pulse: 80 80 80 78  ?Resp: 14 11 (!) 23 19  ?Temp:      ?TempSrc:      ?SpO2: 95% 91% 98% 100%  ?Weight:      ?Height:      ? ? ?Intake/Output Summary (Last 24 hours) at 04/15/2022 0749 ?Last data filed at 04/15/2022 0700 ?Gross per 24 hour  ?Intake 143.36 ml  ?Output 1545 ml  ?Net -1401.64 ml  ? ?Filed Weights  ? 04/12/22 2148 04/14/22 0413  ?Weight: 68 kg 73.4 kg  ? ? ?Physical Exam  ?  ?GEN- The patient is ill appearing, alert and oriented x 3 today.   ?Head- normocephalic, atraumatic ?Eyes-  Sclera clear, conjunctiva pink ?Ears- hearing intact ?Oropharynx- clear ?Neck- supple ?Lungs- Clear to ausculation bilaterally, normal work of breathing ?Heart- Regular rate and rhythm, no murmurs, rubs or gallops ?GI- soft, NT, ND, +  BS ?Extremities- no clubbing or cyanosis. No edema ?Skin- no rash or lesion ?Psych- euthymic mood, full affect ?Neuro- strength and sensation are intact ? ?Labs  ?  ?CBC ?Recent Labs  ?  04/14/22 ?0329 04/15/22 ?0436  ?WBC 10.4 8.1  ?HGB 12.0 11.9*  ?HCT 35.6* 35.2*  ?MCV 91.3 92.4  ?PLT 297 235  ? ?Basic Metabolic Panel ?Recent Labs  ?  04/13/22 ?0109 04/14/22 ?0329 04/15/22 ?0436  ?NA 144 143 143  ?K 3.1* 3.4* 3.3*  ?CL 106 109 107  ?CO2 '30 24 28  '$ ?GLUCOSE 131* 109* 101*  ?BUN 7* <5* <5*  ?CREATININE 0.87 0.75 0.73  ?CALCIUM 8.4* 8.6* 8.9  ?MG 1.5* 1.8  --   ? ?Liver Function Tests ?Recent Labs  ?  04/13/22 ?0109 04/15/22 ?0436  ?AST 24 35  ?ALT 14 19  ?ALKPHOS 51 54  ?BILITOT 0.4 0.4  ?PROT 5.9* 6.3*  ?ALBUMIN 2.6* 2.8*  ? ?No results for input(s): LIPASE, AMYLASE in the last 72 hours. ?Cardiac Enzymes ?No results for input(s): CKTOTAL, CKMB, CKMBINDEX, TROPONINI in the last 72 hours. ? ? ?Telemetry  ?  ?V paced at 80 (personally reviewed) ? ?Radiology  ?  ?No results found. ? ?Patient Profile  ?   ?76yo with HTN, GERD, arthritis, HLD, stroke in 2016 who is admitted with diverticulitis and complete heart block now s/p  temporary pacemaker placement. ? ?Assessment & Plan  ?  ?CHB ?Intermittent escape at VVI 30. ?Temp pacer with capture has been stable. ?Jordan Lane continue to monitor rhythm while diverticulitis is treated. ?PPM Tentatively planned for Thursday 5/18. ?  ?2. Diverticulitis ?3. Nausea ?Per primary team. ?Remains on ABx  ? ?4. HTN ?Dose amlodipine at 10 mg this am.   ?Consider resuming valsartan with elevated BP and hypokalemia ? ?5. Hypokalemia ?K 3.3 this am.  ?Supp ordered ? ? ?For questions or updates, please contact Jordan Lane ?Please consult [email or web address] for contact info under Cardiology/STEMI. ? ?Signed, ?Jordan Friar, PA-C  ?04/15/2022, 7:49 AM  ? ?I have seen and examined this patient with Jordan Lane.  Agree with above, note added to reflect my findings.  Continues to have agitation  overnight.  Remains dependent on temporary pacing wire. ? ?GEN: Well nourished, well developed, in no acute distress  ?HEENT: normal  ?Neck: no JVD, carotid bruits, or masses ?Cardiac: RRR; no murmurs, rubs, or gallops,no edema  ?Respiratory:  clear to auscultation bilaterally, normal work of breathing ?GI: soft, nontender, nondistended, + BS ?MS: no deformity or atrophy  ?Skin: warm and dry ?Neuro:  Strength and sensation are intact ?Psych: euthymic mood, full affect  ? ?Complete heart block: Patient is status post temporary pacer.  Device functioning appropriately.  Jordan Lane plan for likely permanent pacemaker implanted tomorrow.  Jordan Lane make n.p.o. tonight after midnight. ?Diverticulitis: Currently on antibiotics per primary team ?Hypertension: Increased amlodipine to 10 mg daily.  Would be okay to resume valsartan per primary team. ? ?Jordan Lane M. Jordan Mizrahi MD ?04/15/2022 ?1:22 PM  ?

## 2022-04-15 NOTE — Progress Notes (Addendum)
?PROGRESS NOTE ? ?PEGGE CUMBERLEDGE OEU:[id number] DOB: 01-16-1946 DOA: 04/08/2022 ?PCP: Lavone Orn, MD ? ? LOS: 6 days  ? ?Brief Narrative / Interim history: ?76 year old female with HTN, CVA, GERD, IBS, who comes into the hospital with complaints of altered mental status.  She was seen in the ED for altered mental status on 04/07/2022 as a code stroke.  Neurology consulted at that time.  An MRI was negative for CVA and EEG was without seizure activity, and she was sent home on Keflex for presumed UTI.  She continued to be off of her baseline, was more confused and she was brought back.  She was febrile and hypotensive, also found to be bradycardic.  CT scan of the abdomen pelvis showed acute diverticulitis.  She was placed on antibiotics and admitted to the hospital.  Due to presence of bradycardia on admission, cardiology consulted. Found to be in complete heart block and plan is for pacemaker on Thursday. ? ? ?Hospital events: ?5/10 presents to the ED for persistent confusion, found to be febrile and have diverticulitis, admitted to the hospital ?5/12 developed complete heart block, transferred to the ICU, temporary pacing wire placed ? ?Subjective / 24h Interval events: ?Agitation overnight along with urinary retention required foley to be placed ? ?Assesement and Plan: ?Principal Problem: ?  Diverticulitis large intestine ?Active Problems: ?  Essential hypertension ?  Sinus bradycardia ?  HLD (hyperlipidemia) ?  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  Heart block AV complete ([place]) ?  Acute metabolic encephalopathy ? ? ? ?Sepsis due to acute diverticulitis ?-patient met sepsis criteria on admission with significant leukocytosis, fever, and a source as was evident on the CT scan of the abdomen and pelvis.   ?- Zosyn IV ?- sepsis physiology appears to be improving.  WBC is normal. No tenderness on exam, significantly improved.  ?  ?Complete heart block ?- 5/12 AM patient  developed complete heart block.  She was moved to the ICU, and underwent emergent temporary pacemaker placement.  ?-Dr. Renne Crigler discussed with Dr. Juleen China (infectious disease):  recommends to wait on the pacemaker as much as possible and ensure clinical defervescence, ideally to complete treatment of 10 days (Saturday to end), however it could be placed 1 to 2 days earlier if she is clinically stable.   ?-pacemaker placed this Thursday ? ?Prior CVA ?-continue statin, Plavix.  She was just worked up couple of days in the ED and was negative for an acute CVA. ?  ?Essential hypertension ?-elevated this AM-- resume home ARB ? ?Acute metabolic encephalopathy ?-symptoms resolved ? ?Acute urinary retention-due to immobility, ICU, had to place Foley.   ?-foley replaced on 5/17 AM ?-plan to d/c once more mobile ? ?Hyperlipidemia ?-continue statin ?  ?Hypokalemia, hypomagnesemia ?-replete ? ?Delirium ?-precautions added ? ?  ?Scheduled Meds: ? amLODipine  5 mg Oral Daily  ? atorvastatin  20 mg Oral QPM  ? Chlorhexidine Gluconate Cloth  6 each Topical Daily  ? clopidogrel  75 mg Oral QPM  ? enoxaparin (LOVENOX) injection  40 mg Subcutaneous Q24H  ? escitalopram  20 mg Oral Daily  ? fentaNYL (SUBLIMAZE) injection  50 mcg Intravenous Once  ? irbesartan  150 mg Oral Daily  ? pantoprazole  40 mg Oral Daily  ? pneumococcal 20-valent conjugate vaccine  0.5 mL Intramuscular Tomorrow-1000  ? potassium chloride  40 mEq Oral Once  ? QUEtiapine  25 mg Oral QHS  ? senna  1 tablet Oral  BID  ? ?Continuous Infusions: ? sodium chloride Stopped (04/11/22 0011)  ? piperacillin-tazobactam (ZOSYN)  IV 12.5 mL/hr at 04/15/22 0700  ? ?PRN Meds:.sodium chloride, acetaminophen **OR** acetaminophen, ALPRAZolam, diphenhydrAMINE, fluticasone, ipratropium-albuterol, lidocaine, loperamide, melatonin ? ?Diet Orders (From admission, onward)  ? ?  Start     Ordered  ? 04/16/22 0001  Diet clear liquid Room service appropriate? Yes; Fluid consistency: Thin   Diet effective midnight       ?Question Answer Comment  ?Room service appropriate? Yes   ?Fluid consistency: Thin   ?  ? 04/15/22 0749  ? 04/12/22 1030  DIET SOFT Room service appropriate? Yes; Fluid consistency: Thin  Diet effective now       ?Question Answer Comment  ?Room service appropriate? Yes   ?Fluid consistency: Thin   ?  ? 04/12/22 1029  ? ?  ?  ? ?  ? ? ?DVT prophylaxis: enoxaparin (LOVENOX) injection 40 mg Start: 04/13/22 1800 ? ? ?Lab Results  ?Component Value Date  ? PLT 235 04/15/2022  ? ? ?  Code Status: Full Code ? ?Family Communication: Daughter  ? ?Status is: Inpatient ? ?Remains inpatient appropriate because: Severity of illness ? ? ?Level of care: ICU ? ?Consultants:  ?Cardiology - EP ? ? ? ?Objective: ?Vitals:  ? 04/15/22 0640 04/15/22 0705 04/15/22 0710 04/15/22 0758  ?BP: (!) 154/91 (!) 159/113 (!) 181/91   ?Pulse: 80 80 78   ?Resp: 11 (!) 23 19   ?Temp:    98.1 ?F (36.7 ?C)  ?TempSrc:    Oral  ?SpO2: 91% 98% 100%   ?Weight:      ?Height:      ? ? ?Intake/Output Summary (Last 24 hours) at 04/15/2022 0807 ?Last data filed at 04/15/2022 0700 ?Gross per 24 hour  ?Intake 130.82 ml  ?Output 1545 ml  ?Net -1414.18 ml  ? ?Wt Readings from Last 3 Encounters:  ?04/14/22 73.4 kg  ?04/07/22 68 kg  ?03/31/22 69.4 kg  ? ? ?Examination: ? ?General: Appearance:    Obese female in no acute distress-resting comfortably   ?   ?Lungs:     respirations unlabored  ?Heart:    Normal heart rate.   ?MS:   All extremities are intact.   ?Neurologic:   Says she is tired  ?  ? ? ?Data Reviewed: I have independently reviewed following labs and imaging studies  ? ?CBC ?Recent Labs  ?Lab 04/08/22 ?2137 04/08/22 ?2256 04/11/22 ?2979 04/12/22 ?0134 04/13/22 ?0109 04/14/22 ?8921 04/15/22 ?1941  ?WBC 20.3*   < > 12.1* 9.3 9.3 10.4 8.1  ?HGB 13.9   < > 10.8* 10.8* 11.1* 12.0 11.9*  ?HCT 40.0   < > 32.1* 30.8* 32.0* 35.6* 35.2*  ?PLT 298   < > 239 231 264 297 235  ?MCV 92.0   < > 92.5 90.9 90.9 91.3 92.4  ?MCH 32.0   < > 31.1 31.9  31.5 30.8 31.2  ?MCHC 34.8   < > 33.6 35.1 34.7 33.7 33.8  ?RDW 12.7   < > 12.5 12.4 12.4 12.4 12.8  ?LYMPHSABS 2.2  --   --   --   --   --   --   ?MONOABS 1.9*  --   --   --   --   --   --   ?EOSABS 0.0  --   --   --   --   --   --   ?BASOSABS 0.1  --   --   --   --   --   --   ? < > =  values in this interval not displayed.  ? ? ?Recent Labs  ?Lab 04/08/22 ?2137 04/08/22 ?2256 04/08/22 ?2310 04/09/22 ?0050 04/09/22 ?1011 04/10/22 ?0301 04/11/22 ?4696 04/12/22 ?0134 04/13/22 ?0109 04/14/22 ?2952 04/15/22 ?8413  ?NA 141   < >  --   --    < > 138 143 141 144 143 143  ?K 3.2*   < >  --   --    < > 3.4* 3.4* 3.5 3.1* 3.4* 3.3*  ?CL 104  --   --   --    < > 106 108 110 106 109 107  ?CO2 29  --   --   --    < > $R'22 22 24 30 24 28  'fr$ ?GLUCOSE 124*  --   --   --    < > 105* 79 103* 131* 109* 101*  ?BUN 10  --   --   --    < > 8 7* 7* 7* <5* <5*  ?CREATININE 0.92  --   --   --    < > 0.96 0.85 0.83 0.87 0.75 0.73  ?CALCIUM 9.1  --   --   --    < > 8.3* 8.5* 8.3* 8.4* 8.6* 8.9  ?AST 18  --   --   --   --  20 21  --  24  --  35  ?ALT 12  --   --   --   --  13 13  --  14  --  19  ?ALKPHOS 64  --   --   --   --  52 53  --  51  --  54  ?BILITOT 1.9*  --   --   --   --  2.3* 1.3*  --  0.4  --  0.4  ?ALBUMIN 3.4*  --   --   --   --  2.8* 2.6*  --  2.6*  --  2.8*  ?MG  --   --   --   --   --  1.3* 1.8  --  1.5* 1.8  --   ?LATICACIDVEN  --   --  1.2 1.4  --   --   --   --   --   --   --   ?INR 1.2  --   --   --   --   --   --   --   --   --   --   ?TSH  --   --   --   --   --   --  1.974  --   --   --   --   ?AMMONIA 20  --   --   --   --   --   --   --   --   --   --   ? < > = values in this interval not displayed.  ? ? ?------------------------------------------------------------------------------------------------------------------ ?No results for input(s): CHOL, HDL, LDLCALC, TRIG, CHOLHDL, LDLDIRECT in the last 72 hours. ? ?Lab Results  ?Component Value Date  ? HGBA1C 5.3 09/12/2021   ? ?------------------------------------------------------------------------------------------------------------------ ?No results for input(s): TSH, T4TOTAL, T3FREE, THYROIDAB in the last 72 hours. ? ?Invalid input(s): FREET3 ? ? ?Cardiac Enzymes ?No

## 2022-04-16 ENCOUNTER — Encounter (HOSPITAL_COMMUNITY)
Admission: EM | Disposition: A | Discharge status: Home / Self Care | Payer: Self-pay | Source: Home / Self Care | Attending: Internal Medicine

## 2022-04-16 DIAGNOSIS — I639 Cerebral infarction, unspecified: Secondary | ICD-10-CM

## 2022-04-16 DIAGNOSIS — I442 Atrioventricular block, complete: Secondary | ICD-10-CM | POA: Diagnosis not present

## 2022-04-16 DIAGNOSIS — K5732 Diverticulitis of large intestine without perforation or abscess without bleeding: Secondary | ICD-10-CM | POA: Diagnosis not present

## 2022-04-16 DIAGNOSIS — R001 Bradycardia, unspecified: Secondary | ICD-10-CM | POA: Diagnosis not present

## 2022-04-16 DIAGNOSIS — E876 Hypokalemia: Secondary | ICD-10-CM

## 2022-04-16 HISTORY — PX: LOOP RECORDER REMOVAL: EP1215

## 2022-04-16 HISTORY — PX: PACEMAKER IMPLANT: EP1218

## 2022-04-16 LAB — BASIC METABOLIC PANEL
Anion gap: 4 — ABNORMAL LOW (ref 5–15)
BUN: 5 mg/dL — ABNORMAL LOW (ref 8–23)
CO2: 28 mmol/L (ref 22–32)
Calcium: 8.7 mg/dL — ABNORMAL LOW (ref 8.9–10.3)
Chloride: 108 mmol/L (ref 98–111)
Creatinine, Ser: 0.87 mg/dL (ref 0.44–1.00)
GFR, Estimated: >60 mL/min (ref >60)
Glucose, Bld: 126 mg/dL — ABNORMAL HIGH (ref 70–99)
Potassium: 3.4 mmol/L — ABNORMAL LOW (ref 3.5–5.1)
Sodium: 140 mmol/L (ref 135–145)

## 2022-04-16 LAB — SURGICAL PCR SCREEN
MRSA, PCR: NEGATIVE
Staphylococcus aureus: POSITIVE — AB

## 2022-04-16 LAB — GLUCOSE, CAPILLARY: Glucose-Capillary: 115 mg/dL — ABNORMAL HIGH (ref 70–99)

## 2022-04-16 LAB — MAGNESIUM: Magnesium: 1.8 mg/dL (ref 1.7–2.4)

## 2022-04-16 SURGERY — PACEMAKER IMPLANT

## 2022-04-16 MED ORDER — CEFAZOLIN SODIUM-DEXTROSE 1-4 GM/50ML-% IV SOLN
1.0000 g | Freq: Four times a day (QID) | INTRAVENOUS | Status: AC
  Administered 2022-04-16 – 2022-04-17 (×3): 1 g via INTRAVENOUS
  Filled 2022-04-16 (×3): qty 50

## 2022-04-16 MED ORDER — SODIUM CHLORIDE 0.9 % IV SOLN: INTRAVENOUS | Status: DC

## 2022-04-16 MED ORDER — SODIUM CHLORIDE 0.9 % IV SOLN
80.0000 mg | INTRAVENOUS | Status: DC
  Administered 2022-04-16: 80 mg

## 2022-04-16 MED ORDER — CHLORHEXIDINE GLUCONATE 4 % EX LIQD
60.0000 mL | Freq: Once | CUTANEOUS | Status: AC
  Administered 2022-04-16: 4 via TOPICAL
  Filled 2022-04-16: qty 60

## 2022-04-16 MED ORDER — FENTANYL CITRATE (PF) 100 MCG/2ML IJ SOLN
INTRAMUSCULAR | Status: DC | PRN
  Administered 2022-04-16 (×2): 25 ug via INTRAVENOUS

## 2022-04-16 MED ORDER — ACETAMINOPHEN 325 MG PO TABS
325.0000 mg | ORAL_TABLET | ORAL | Status: DC | PRN
  Administered 2022-04-16: 650 mg via ORAL

## 2022-04-16 MED ORDER — LIDOCAINE HCL 1 % IJ SOLN
INTRAMUSCULAR | Status: AC
  Filled 2022-04-16: qty 60

## 2022-04-16 MED ORDER — LIDOCAINE HCL (PF) 1 % IJ SOLN
INTRAMUSCULAR | Status: DC | PRN
  Administered 2022-04-16: 20 mL
  Administered 2022-04-16: 50 mL

## 2022-04-16 MED ORDER — CEFAZOLIN SODIUM-DEXTROSE 2-4 GM/100ML-% IV SOLN
2.0000 g | INTRAVENOUS | Status: AC
  Administered 2022-04-16: 2 g via INTRAVENOUS
  Filled 2022-04-16: qty 100

## 2022-04-16 MED ORDER — HEPARIN (PORCINE) IN NACL 1000-0.9 UT/500ML-% IV SOLN
INTRAVENOUS | Status: DC | PRN
  Administered 2022-04-16: 500 mL

## 2022-04-16 MED ORDER — CHLORHEXIDINE GLUCONATE 4 % EX LIQD
60.0000 mL | Freq: Once | CUTANEOUS | Status: AC
  Administered 2022-04-16: 4 via TOPICAL
  Filled 2022-04-16: qty 15

## 2022-04-16 MED ORDER — FENTANYL CITRATE (PF) 100 MCG/2ML IJ SOLN
INTRAMUSCULAR | Status: AC
  Filled 2022-04-16: qty 2

## 2022-04-16 MED ORDER — POTASSIUM CHLORIDE 10 MEQ/50ML IV SOLN
10.0000 meq | INTRAVENOUS | Status: AC
  Administered 2022-04-16 (×4): 10 meq via INTRAVENOUS
  Filled 2022-04-16 (×3): qty 50

## 2022-04-16 MED ORDER — HEPARIN (PORCINE) IN NACL 1000-0.9 UT/500ML-% IV SOLN
INTRAVENOUS | Status: AC
  Filled 2022-04-16: qty 500

## 2022-04-16 MED ORDER — MUPIROCIN 2 % EX OINT
1.0000 "application " | TOPICAL_OINTMENT | Freq: Two times a day (BID) | CUTANEOUS | Status: DC
  Administered 2022-04-16 – 2022-04-18 (×5): 1 via NASAL
  Filled 2022-04-16 (×2): qty 22

## 2022-04-16 MED ORDER — GENTAMICIN SULFATE 40 MG/ML IJ SOLN
INTRAMUSCULAR | Status: DC | PRN
  Administered 2022-04-16: 80 mg

## 2022-04-16 MED ORDER — SODIUM CHLORIDE 0.9 % IV SOLN
INTRAVENOUS | Status: AC
  Filled 2022-04-16: qty 2

## 2022-04-16 MED ORDER — POTASSIUM CHLORIDE 10 MEQ/100ML IV SOLN
10.0000 meq | INTRAVENOUS | Status: DC
  Administered 2022-04-16 (×2): 10 meq via INTRAVENOUS
  Filled 2022-04-16 (×6): qty 100

## 2022-04-16 MED ORDER — MAGNESIUM SULFATE 2 GM/50ML IV SOLN
2.0000 g | Freq: Once | INTRAVENOUS | Status: AC
  Administered 2022-04-16: 2 g via INTRAVENOUS
  Filled 2022-04-16: qty 50

## 2022-04-16 SURGICAL SUPPLY — 12 items
CABLE SURGICAL S-101-97-12 (CABLE) ×2 IMPLANT
CATH RIGHTSITE C315HIS02 (CATHETERS) ×1 IMPLANT
IPG PACE AZUR XT DR MRI W1DR01 (Pacemaker) IMPLANT
LEAD CAPSURE NOVUS 5076-52CM (Lead) ×1 IMPLANT
LEAD SELECT SECURE 3830 383069 (Lead) IMPLANT
PACE AZURE XT DR MRI W1DR01 (Pacemaker) ×2 IMPLANT
PAD DEFIB RADIO PHYSIO CONN (PAD) ×2 IMPLANT
SELECT SECURE 3830 383069 (Lead) ×2 IMPLANT
SHEATH 7FR PRELUDE SNAP 13 (SHEATH) ×2 IMPLANT
SLITTER 6232ADJ (MISCELLANEOUS) ×1 IMPLANT
TRAY PACEMAKER INSERTION (PACKS) ×3 IMPLANT
WIRE HI TORQ VERSACORE-J 145CM (WIRE) ×1 IMPLANT

## 2022-04-16 NOTE — Plan of Care (Signed)

## 2022-04-16 NOTE — Progress Notes (Addendum)
Electrophysiology Rounding Note  Patient Name: Jordan Lane Date of Encounter: 04/16/2022  Primary Cardiologist: None Electrophysiologist: None   Subjective   The patient is doing well today.  At this time, the patient denies chest pain, shortness of breath, or any new concerns.  Inpatient Medications    Scheduled Meds:  amLODipine  5 mg Oral Daily   atorvastatin  20 mg Oral QPM   chlorhexidine  60 mL Topical Once   Chlorhexidine Gluconate Cloth  6 each Topical Daily   enoxaparin (LOVENOX) injection  40 mg Subcutaneous Q24H   escitalopram  20 mg Oral Daily   fentaNYL (SUBLIMAZE) injection  50 mcg Intravenous Once   gentamicin irrigation  80 mg Irrigation On Call   irbesartan  150 mg Oral Daily   lidocaine  1 patch Transdermal Q24H   pantoprazole  40 mg Oral Daily   pneumococcal 20-valent conjugate vaccine  0.5 mL Intramuscular Tomorrow-1000   QUEtiapine  25 mg Oral QHS   senna  1 tablet Oral BID   Continuous Infusions:  sodium chloride Stopped (04/11/22 0011)   sodium chloride     sodium chloride      ceFAZolin (ANCEF) IV     piperacillin-tazobactam (ZOSYN)  IV 12.5 mL/hr at 04/16/22 0600   potassium chloride 10 mEq (04/16/22 0728)   PRN Meds: sodium chloride, acetaminophen **OR** acetaminophen, ALPRAZolam, diphenhydrAMINE, fluticasone, hydrALAZINE, ipratropium-albuterol, lidocaine, loperamide   Vital Signs    Vitals:   04/16/22 0400 04/16/22 0500 04/16/22 0600 04/16/22 0700  BP: 121/86 140/81 (!) 146/92   Pulse: 80 80 80 79  Resp: '14 17 10 '$ (!) 22  Temp: 97.6 F (36.4 C)     TempSrc: Axillary     SpO2: 97% 92% 97% 97%  Weight:  70.8 kg    Height:        Intake/Output Summary (Last 24 hours) at 04/16/2022 0728 Last data filed at 04/16/2022 0600 Gross per 24 hour  Intake 515.94 ml  Output 1875 ml  Net -1359.06 ml   Filed Weights   04/12/22 2148 04/14/22 0413 04/16/22 0500  Weight: 68 kg 73.4 kg 70.8 kg    Physical Exam    GEN- The patient is  fatigue and elderly appearing, alert and oriented x 3 today.   Head- normocephalic, atraumatic Eyes-  Sclera clear, conjunctiva pink Ears- hearing intact Oropharynx- clear Neck- supple Lungs- Clear to ausculation bilaterally, normal work of breathing Heart- Regular rate and rhythm, no murmurs, rubs or gallops GI- soft, NT, ND, + BS Extremities- no clubbing or cyanosis. No edema Skin- no rash or lesion Psych- flat affect Neuro- strength and sensation are intact  Labs    CBC Recent Labs    04/14/22 0329 04/15/22 0436  WBC 10.4 8.1  HGB 12.0 11.9*  HCT 35.6* 35.2*  MCV 91.3 92.4  PLT 297 468   Basic Metabolic Panel Recent Labs    04/14/22 0329 04/15/22 0436 04/16/22 0145  NA 143 143 140  K 3.4* 3.3* 3.4*  CL 109 107 108  CO2 '24 28 28  '$ GLUCOSE 109* 101* 126*  BUN <5* <5* 5*  CREATININE 0.75 0.73 0.87  CALCIUM 8.6* 8.9 8.7*  MG 1.8  --  1.8   Liver Function Tests Recent Labs    04/15/22 0436  AST 35  ALT 19  ALKPHOS 54  BILITOT 0.4  PROT 6.3*  ALBUMIN 2.8*   No results for input(s): LIPASE, AMYLASE in the last 72 hours. Cardiac Enzymes No results for  input(s): CKTOTAL, CKMB, CKMBINDEX, TROPONINI in the last 72 hours.   Telemetry    V paced at 80 (personally reviewed)  Radiology    No results found.  Patient Profile     76yo with HTN, GERD, arthritis, HLD, stroke in 2016 who is admitted with diverticulitis and complete heart block now s/p temporary pacemaker placement.  Assessment & Plan    CHB Intermittent escape at VVI 30. Temp pacer with capture has been stable. For pacer today.    2. Diverticulitis 3. Nausea Per primary team. Remains on ABx through this coming weekend.    4. HTN Dose amlodipine at 10 mg this am.   Consider resuming valsartan with elevated BP and hypokalemia   5. Hypokalemia K 3.4 this am.  Supp.   6. H/o CVA Hold plavix for procedure.   For questions or updates, please contact [place] Please consult  [email or web address] for contact info under Cardiology/STEMI.  Signed, [name], PA-C  04/16/2022, 7:28 AM   I have seen and examined this patient with [name].  Agree with above, note added to reflect my findings.  Feeling much improved with clearing mental status.  Plan for pacemaker today.  GEN: Well nourished, well developed, in no acute distress  HEENT: normal  Neck: no JVD, carotid bruits, or masses Cardiac: RRR; no murmurs, rubs, or gallops,no edema  Respiratory:  clear to auscultation bilaterally, normal work of breathing GI: soft, nontender, nondistended, + BS MS: no deformity or atrophy  Skin: warm and dry Neuro:  Strength and sensation are intact Psych: euthymic mood, full affect   Complete heart block: Remains dependent on temporary pacing wire.  We [name] plan for pacemaker implant with a Linq monitor explant today.  Risk and benefits were discussed risk of bleeding, infection, tamponade, pneumothorax.  She understands these risks and is agreed to the procedure.  [name] M. Bron Snellings MD 04/16/2022 9:40 AM

## 2022-04-16 NOTE — H&P (View-Only) (Signed)
Electrophysiology Rounding Note  Patient Name: Jordan Lane Date of Encounter: 04/16/2022  Primary Cardiologist: None Electrophysiologist: None   Subjective   The patient is doing well today.  At this time, the patient denies chest pain, shortness of breath, or any new concerns.  Inpatient Medications    Scheduled Meds:  amLODipine  5 mg Oral Daily   atorvastatin  20 mg Oral QPM   chlorhexidine  60 mL Topical Once   Chlorhexidine Gluconate Cloth  6 each Topical Daily   enoxaparin (LOVENOX) injection  40 mg Subcutaneous Q24H   escitalopram  20 mg Oral Daily   fentaNYL (SUBLIMAZE) injection  50 mcg Intravenous Once   gentamicin irrigation  80 mg Irrigation On Call   irbesartan  150 mg Oral Daily   lidocaine  1 patch Transdermal Q24H   pantoprazole  40 mg Oral Daily   pneumococcal 20-valent conjugate vaccine  0.5 mL Intramuscular Tomorrow-1000   QUEtiapine  25 mg Oral QHS   senna  1 tablet Oral BID   Continuous Infusions:  sodium chloride Stopped (04/11/22 0011)   sodium chloride     sodium chloride      ceFAZolin (ANCEF) IV     piperacillin-tazobactam (ZOSYN)  IV 12.5 mL/hr at 04/16/22 0600   potassium chloride 10 mEq (04/16/22 0728)   PRN Meds: sodium chloride, acetaminophen **OR** acetaminophen, ALPRAZolam, diphenhydrAMINE, fluticasone, hydrALAZINE, ipratropium-albuterol, lidocaine, loperamide   Vital Signs    Vitals:   04/16/22 0400 04/16/22 0500 04/16/22 0600 04/16/22 0700  BP: 121/86 140/81 (!) 146/92   Pulse: 80 80 80 79  Resp: '14 17 10 '$ (!) 22  Temp: 97.6 F (36.4 C)     TempSrc: Axillary     SpO2: 97% 92% 97% 97%  Weight:  70.8 kg    Height:        Intake/Output Summary (Last 24 hours) at 04/16/2022 0728 Last data filed at 04/16/2022 0600 Gross per 24 hour  Intake 515.94 ml  Output 1875 ml  Net -1359.06 ml   Filed Weights   04/12/22 2148 04/14/22 0413 04/16/22 0500  Weight: 68 kg 73.4 kg 70.8 kg    Physical Exam    GEN- The patient is  fatigue and elderly appearing, alert and oriented x 3 today.   Head- normocephalic, atraumatic Eyes-  Sclera clear, conjunctiva pink Ears- hearing intact Oropharynx- clear Neck- supple Lungs- Clear to ausculation bilaterally, normal work of breathing Heart- Regular rate and rhythm, no murmurs, rubs or gallops GI- soft, NT, ND, + BS Extremities- no clubbing or cyanosis. No edema Skin- no rash or lesion Psych- flat affect Neuro- strength and sensation are intact  Labs    CBC Recent Labs    04/14/22 0329 04/15/22 0436  WBC 10.4 8.1  HGB 12.0 11.9*  HCT 35.6* 35.2*  MCV 91.3 92.4  PLT 297 841   Basic Metabolic Panel Recent Labs    04/14/22 0329 04/15/22 0436 04/16/22 0145  NA 143 143 140  K 3.4* 3.3* 3.4*  CL 109 107 108  CO2 '24 28 28  '$ GLUCOSE 109* 101* 126*  BUN <5* <5* 5*  CREATININE 0.75 0.73 0.87  CALCIUM 8.6* 8.9 8.7*  MG 1.8  --  1.8   Liver Function Tests Recent Labs    04/15/22 0436  AST 35  ALT 19  ALKPHOS 54  BILITOT 0.4  PROT 6.3*  ALBUMIN 2.8*   No results for input(s): LIPASE, AMYLASE in the last 72 hours. Cardiac Enzymes No results for  input(s): CKTOTAL, CKMB, CKMBINDEX, TROPONINI in the last 72 hours.   Telemetry    V paced at 80 (personally reviewed)  Radiology    No results found.  Patient Profile     76yo with HTN, GERD, arthritis, HLD, stroke in 2016 who is admitted with diverticulitis and complete heart block now s/p temporary pacemaker placement.  Assessment & Plan    CHB Intermittent escape at VVI 30. Temp pacer with capture has been stable. For pacer today.    2. Diverticulitis 3. Nausea Per primary team. Remains on ABx through this coming weekend.    4. HTN Dose amlodipine at 10 mg this am.   Consider resuming valsartan with elevated BP and hypokalemia   5. Hypokalemia K 3.4 this am.  Supp.   6. H/o CVA Hold plavix for procedure.   For questions or updates, please contact [place] Please consult  [email or web address] for contact info under Cardiology/STEMI.  Signed, [name], PA-C  04/16/2022, 7:28 AM   I have seen and examined this patient with [name].  Agree with above, note added to reflect my findings.  Feeling much improved with clearing mental status.  Plan for pacemaker today.  GEN: Well nourished, well developed, in no acute distress  HEENT: normal  Neck: no JVD, carotid bruits, or masses Cardiac: RRR; no murmurs, rubs, or gallops,no edema  Respiratory:  clear to auscultation bilaterally, normal work of breathing GI: soft, nontender, nondistended, + BS MS: no deformity or atrophy  Skin: warm and dry Neuro:  Strength and sensation are intact Psych: euthymic mood, full affect   Complete heart block: Remains dependent on temporary pacing wire.  We Wolf Boulay plan for pacemaker implant with a Linq monitor explant today.  Risk and benefits were discussed risk of bleeding, infection, tamponade, pneumothorax.  She understands these risks and is agreed to the procedure.  Ayaz Sondgeroth M. Mende Biswell MD 04/16/2022 9:40 AM

## 2022-04-16 NOTE — TOC Initial Note (Signed)
Transition of Care [facility]) - Initial/Assessment Note    Patient Details  Name: Jordan Lane MRN: [id number] Date of Birth: Mar 27, 1946  [facility] CM/SW Contact:    Bethena Roys, RN Phone Number: 04/16/2022, 10:36 AM  Clinical Narrative:  Risk for readmission assessment completed. PTA patient was independent from home with spouse and support of daughters. Patient has durable medical equipment cane, rolling walker, wheelchair and shower chair in the home. No DME needs identified. Per daughter Maudie Mercury, she will be staying with the patient once she transitions home. Case Manager discussed home health services and the patient and family are agreeable to services. Family did not have a preference and is willing to be serviced via [place]. Referral made to [facility] and start of care to begin within 24-48 hours post transition home. Case Manager will follow for orders for PT/OT with Face to face.                Expected Discharge Plan: [place] Barriers to Discharge: Continued Medical Work up   Patient Goals and CMS Choice Patient states their goals for this hospitalization and ongoing recovery are:: Patient wants to return home.   Choice offered to / list presented to : Patient (Patient and family did not have a preference.)  Expected Discharge Plan and Services Expected Discharge Plan: [place] In-house Referral: Clinical Social Work Discharge Planning Services: CM Consult Post Acute Care Choice: [place] arrangements for the past 2 months: Single Family Home                 DME Arranged: N/A DME Agency: NA       HH Arranged: OT, PT [place] Agency: [place] Date [place]: 04/16/22 [place] Contacted: 37 Representative spoke with at [place]: Tommi Rumps  Prior Living Arrangements/Services Living arrangements for the past 2 months: [place] with:: Self, Adult  Children Patient language and need for interpreter reviewed:: Yes Do you feel safe going back to the place where you live?: Yes      Need for Family Participation in Patient Care: Yes (Comment) Care giver support system in place?: Yes (comment) Current home services: DME (Patient has cane, RW, shower chair, and wheelchair.) Criminal Activity/Legal Involvement Pertinent to Current Situation/Hospitalization: No - Comment as needed  Activities of Daily Living Home Assistive Devices/Equipment: None ADL Screening (condition at time of admission) Patient's cognitive ability adequate to safely complete daily activities?: Yes Is the patient deaf or have difficulty hearing?: No Does the patient have difficulty seeing, even when wearing glasses/contacts?: No Does the patient have difficulty concentrating, remembering, or making decisions?: Yes Patient able to express need for assistance with ADLs?: Yes Does the patient have difficulty dressing or bathing?: Yes Independently performs ADLs?: No Communication: Independent Dressing (OT): Needs assistance Is this a change from baseline?: Change from baseline, expected to last <3days Grooming: Independent Feeding: Independent Bathing: Needs assistance Is this a change from baseline?: Change from baseline, expected to last <3 days Toileting: Needs assistance Is this a change from baseline?: Change from baseline, expected to last >3days In/Out Bed: Needs assistance Is this a change from baseline?: Change from baseline, expected to last >3 days Walks in Home: Independent Does the patient have difficulty walking or climbing stairs?: No Weakness of Legs: None Weakness of Arms/Hands: None  Permission Sought/Granted Permission sought to share information with : Family Supports, Customer service manager, Case Manager  Permission granted to share information with : Yes, Verbal Permission Granted     Permission granted to share info w AGENCY:  Al[facility]mings        Emotional Assessment Appearance:: Appears stated age Attitude/Demeanor/Rapport: Engaged Affect (typically observed): Appropriate Orientation: : Oriented to Self, Oriented to Place, Oriented to  Time, Oriented to Situation Alcohol / Substance Use: Not Applicable Psych Involvement: No (comment)  Admission diagnosis:  Diverticulitis [K57.92] Diverticulitis large intestine [K57.32] Encephalopathy [G93.40] Patient Active Problem List   Diagnosis Date Noted   Hypokalemia 04/16/2022   Heart block AV complete ([place])    Acute metabolic encephalopathy    Diverticulitis large intestine 04/09/2022   Sinus bradycardia 04/09/2022   Acute ischemic left MCA stroke ([place]) 09/11/2021   Palpitations 06/29/2016   Burning pain 01/07/2016   Acute ischemic stroke ([place]) 10/22/2015   HLD (hyperlipidemia)    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 ([place])    Obese 11/28/2013   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   Tachycardia 06/14/2012   PVC (premature ventricular contraction) 06/14/2012   HTN (hypertension) 06/14/2012   Reflux    Uterine prolapse    Essential hypertension    Fibroadenoma of breast    PCP:  Lavone Orn, MD Pharmacy:   [facility], [address] [place] [address] Phone: [phone] Fax: [phone]    Readmission Risk Interventions    04/16/2022   10:31 AM  Readmission Risk Prevention Plan  Transportation Screening Complete  PCP or Specialist Appt within 3-5 Days Complete  HRI or [place] Complete  Social Work Consult for [place] Planning/Counseling Complete  Palliative Care Screening Not Applicable  Medication Review Press photographer) Complete

## 2022-04-16 NOTE — Progress Notes (Signed)
PROGRESS NOTE  Jordan Lane YWV:[id number] DOB: [birth year] DOA: 04/08/2022 PCP: Lavone Orn, MD   LOS: 7 days   Brief Narrative / Interim history: 76 year old female with HTN, CVA, GERD, IBS, who comes into the hospital with complaints of altered mental status.  She was seen in the ED for altered mental status on 04/07/2022 as a code stroke.  Neurology consulted at that time.  An MRI was negative for CVA and EEG was without seizure activity, and she was sent home on Keflex for presumed UTI.  She continued to be off of her baseline, was more confused and she was brought back.  She was febrile and hypotensive, also found to be bradycardic.  CT scan of the abdomen pelvis showed acute diverticulitis.  She was placed on antibiotics and admitted to the hospital.  Due to presence of bradycardia on admission, cardiology consulted. Found to be in complete heart block and plan is for pacemaker on 5/18.   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   - Zosyn IV - sepsis physiology appears to have resolved.  WBC is normal. No tenderness on exam, significantly improved.    Complete heart block - 5/12 AM patient developed complete heart block.  She  was moved to the ICU, and underwent emergent temporary pacemaker placement.  -Dr. Renne Crigler discussed with Dr. Juleen China (infectious disease):  recommends to wait on the pacemaker as much as possible and ensure clinical defervescence, ideally to complete treatment of 10 days (Saturday to end), however it could be placed 1 to 2 days earlier if she is clinically stable.   -pacemaker planned for 5/18  Prior CVA -continue statin, Plavix.  She was just worked up couple of days in the ED and was negative for an acute CVA.   Essential hypertension -elevated this AM-- resume home ARB -adjust meds as needed  Acute metabolic encephalopathy -symptoms resolved  Acute urinary retention-due to immobility, ICU, had to place Foley.   -foley replaced on 5/17 AM -plan to d/c foley once more mobile  Hyperlipidemia -continue statin   Hypokalemia, hypomagnesemia -replete as needed  Delirium -precautions added    Scheduled Meds:  amLODipine  5 mg Oral Daily   atorvastatin  20 mg Oral QPM   chlorhexidine  60 mL Topical Once   Chlorhexidine Gluconate Cloth  6 each Topical Daily   enoxaparin (LOVENOX) injection  40 mg Subcutaneous Q24H   escitalopram  20 mg Oral Daily   fentaNYL (SUBLIMAZE) injection  50 mcg Intravenous Once   gentamicin irrigation  80 mg Irrigation On Call   irbesartan  150 mg Oral Daily   lidocaine  1 patch Transdermal Q24H   pantoprazole  40 mg Oral Daily   pneumococcal 20-valent conjugate vaccine  0.5 mL Intramuscular Tomorrow-1000   QUEtiapine  25 mg Oral QHS   senna  1 tablet Oral BID   Continuous Infusions:  sodium chloride Stopped (04/11/22 0011)   sodium chloride     sodium chloride      ceFAZolin (ANCEF) IV     piperacillin-tazobactam (ZOSYN)  IV 12.5 mL/hr at 04/16/22 0600   potassium chloride 10 mEq (04/16/22 0728)   PRN Meds:.sodium chloride, acetaminophen **OR** acetaminophen, ALPRAZolam, diphenhydrAMINE, fluticasone, hydrALAZINE, ipratropium-albuterol, lidocaine,  loperamide  Diet Orders (From admission, onward)     Start     Ordered   04/16/22 0900  Diet NPO time specified Except for: Other (See Comments)  Diet effective ____       Comments: Meds  Question:  Except for  Answer:  Other (See Comments)   04/16/22 0403   04/16/22 0404  Diet clear liquid Room service appropriate? Yes; Fluid consistency: Thin  Diet effective midnight       Question Answer Comment  Room service appropriate? Yes   Fluid consistency: Thin      04/16/22 0403            DVT prophylaxis: enoxaparin (LOVENOX) injection 40 mg Start: 04/13/22 1800   Lab Results  Component Value Date   PLT 235 04/15/2022      Code Status: Full Code  Family Communication: Daughter 5/17  Status is: Inpatient  Remains inpatient appropriate because: Severity of illness   Level of care: ICU  Consultants:  Cardiology - EP    Objective: Vitals:   04/16/22 0400 04/16/22 0500 04/16/22 0600 04/16/22 0700  BP: 121/86 140/81 (!) 146/92   Pulse: 80 80 80 79  Resp: _0 (!) 22  Temp: 97.6 F (36.4 C)     TempSrc: Axillary     SpO2: 97% 92% 97% 97%  Weight:  70.8 kg    Height:        Intake/Output Summary (Last 24 hours) at 04/16/2022 0739 Last data filed at 04/16/2022 0600 Gross per 24 hour  Intake 515.94 ml  Output 1875 ml  Net -1359.06 ml   Wt Readings from Last 3 Encounters:  04/16/22 70.8 kg  04/07/22 68 kg  03/31/22 69.4 kg    Examination:   General: Appearance:     Overweight female in no acute distress- sleeping     Lungs:      respirations unlabored  Heart:    Normal heart rate.   MS:   All extremities are intact.   Neurologic:   sleeping      Data Reviewed: I have independently reviewed following labs and imaging studies   CBC Recent Labs  Lab 04/11/22 0542 04/12/22 0134 04/13/22 0109 04/14/22 0329 04/15/22 0436  WBC 12.1* 9.3 9.3 10.4 8.1  HGB 10.8* 10.8* 11.1* 12.0 11.9*  HCT 32.1* 30.8* 32.0* 35.6* 35.2*  PLT 239 231 264 297  235  MCV 92.5 90.9 90.9 91.3 92.4  MCH 31.1 31.9 31.5 30.8 31.2  MCHC 33.6 35.1 34.7 33.7 33.8  RDW 12.5 12.4 12.4 12.4 12.8    Recent Labs  Lab 04/10/22 0301 04/11/22 0542 04/12/22 0134 04/13/22 0109 04/14/22 0329 04/15/22 0436 04/16/22 0145  NA 138 143 141 144 143 143 140  K 3.4* 3.4* 3.5 3.1* 3.4* 3.3* 3.4*  CL 106 108 110 106 109 107 108  CO2 _1 GLUCOSE 105* 79 103* 131* 109* 101* 126*  BUN 8 7* 7* 7* <5* <5* 5*  CREATININE 0.96 0.85 0.83 0.87 0.75 0.73  0.87  CALCIUM 8.3* 8.5* 8.3* 8.4* 8.6* 8.9 8.7*  AST 20 21  --  24  --  35  --   ALT 13 13  --  14  --  19  --   ALKPHOS 52 53  --  51  --  54  --   BILITOT 2.3* 1.3*  --  0.4  --  0.4  --   ALBUMIN 2.8* 2.6*  --  2.6*  --  2.8*  --   MG 1.3* 1.8  --  1.5* 1.8  --  1.8  TSH  --  1.974  --   --   --   --   --     ------------------------------------------------------------------------------------------------------------------ No results for input(s): CHOL, HDL, LDLCALC, TRIG, CHOLHDL, LDLDIRECT in the last 72 hours.  Lab Results  Component Value Date   HGBA1C 5.3 09/12/2021   ------------------------------------------------------------------------------------------------------------------ No results for input(s): TSH, T4TOTAL, T3FREE, THYROIDAB in the last 72 hours.  Invalid input(s): FREET3   Cardiac Enzymes No results for input(s): CKMB, TROPONINI, MYOGLOBIN in the last 168 hours.  Invalid input(s): CK ------------------------------------------------------------------------------------------------------------------ No results found for: BNP  CBG: Recent Labs  Lab 04/10/22 2359  GLUCAP 86    Recent Results (from the past 240 hour(s))  Resp Panel by RT-PCR (Flu A&B, Covid) Nasopharyngeal Swab     Status: None   Collection Time: 04/07/22  9:42 PM   Specimen: Nasopharyngeal Swab; Nasopharyngeal(NP) swabs in vial transport medium  Result Value Ref Range Status   SARS Coronavirus 2 by  RT PCR NEGATIVE NEGATIVE Final    Comment: (NOTE) SARS-CoV-2 target nucleic acids are NOT DETECTED.  The SARS-CoV-2 RNA is generally detectable in upper respiratory specimens during the acute phase of infection. The lowest concentration of SARS-CoV-2 viral copies this assay can detect is 138 copies/mL. A negative result does not preclude SARS-Cov-2 infection and should not be used as the sole basis for treatment or other patient management decisions. A negative result may occur with  improper specimen collection/handling, submission of specimen other than nasopharyngeal swab, presence of viral mutation(s) within the areas targeted by this assay, and inadequate number of viral copies(<138 copies/mL). A negative result must be combined with clinical observations, patient history, and epidemiological information. The expected result is Negative.  Fact Sheet for Patients:  EntrepreneurPulse.com.au  Fact Sheet for Healthcare Providers:  IncredibleEmployment.be  This test is no t yet approved or cleared by the [place] FDA and  has been authorized for detection and/or diagnosis of SARS-CoV-2 by FDA under an Emergency Use Authorization (EUA). This EUA will remain  in effect (meaning this test can be used) for the duration of the COVID-19 declaration under Section 564(b)(1) of the Act, 21 U.S.C.section 360bbb-3(b)(1), unless the authorization is terminated  or revoked sooner.       Influenza A by PCR NEGATIVE NEGATIVE Final   Influenza B by PCR NEGATIVE NEGATIVE Final    Comment: (NOTE) The Xpert Xpress SARS-CoV-2/FLU/RSV plus assay is intended as an aid in the diagnosis of influenza from Nasopharyngeal swab specimens and should not be used as a sole basis for treatment. Nasal washings and aspirates are unacceptable for Xpert Xpress SARS-CoV-2/FLU/RSV testing.  Fact Sheet for Patients: EntrepreneurPulse.com.au  Fact Sheet  for Healthcare Providers: IncredibleEmployment.be  This test is not yet approved or cleared by the [place] FDA and has been authorized for detection and/or diagnosis of SARS-CoV-2 by FDA under an Emergency Use Authorization (EUA). This EUA will remain in effect (meaning  this test can be used) for the duration of the COVID-19 declaration under Section 564(b)(1) of the Act, 21 U.S.C. section 360bbb-3(b)(1), unless the authorization is terminated or revoked.  Performed at [facility], [place] [address] [place], [place] [address]   Urine Culture     Status: Abnormal   Collection Time: 04/08/22  4:20 AM   Specimen: Urine, Clean Catch  Result Value Ref Range Status   Specimen Description URINE, CLEAN CATCH  Final   Special Requests NONE  Final   Culture (A)  Final    10,000 COLONIES/mL GROUP B STREP(S.AGALACTIAE)ISOLATED TESTING AGAINST S. AGALACTIAE NOT ROUTINELY PERFORMED DUE TO PREDICTABILITY OF AMP/PEN/VAN SUSCEPTIBILITY. Performed at [facility], [place] [address] [place], [place] [address]    Report Status 04/09/2022 FINAL  Final  Blood Culture (routine x 2)     Status: None   Collection Time: 04/08/22 11:00 PM   Specimen: BLOOD  Result Value Ref Range Status   Specimen Description BLOOD LEFT ANTECUBITAL  Final   Special Requests   Final    BOTTLES DRAWN AEROBIC AND ANAEROBIC Blood Culture adequate volume   Culture   Final    NO GROWTH 5 DAYS Performed at [facility], [place] [address] [place], [place] [address]    Report Status 04/13/2022 FINAL  Final  Blood Culture (routine x 2)     Status: None   Collection Time: 04/08/22 11:10 PM   Specimen: BLOOD LEFT FOREARM  Result Value Ref Range Status   Specimen Description BLOOD LEFT FOREARM  Final   Special Requests   Final    BOTTLES DRAWN AEROBIC AND ANAEROBIC Blood Culture adequate volume   Culture   Final    NO GROWTH 5 DAYS Performed at [facility], [place] [address],  [place], [place] [address]    Report Status 04/13/2022 FINAL  Final  Urine Culture     Status: None   Collection Time: 04/09/22  6:15 AM   Specimen: In/Out Cath Urine  Result Value Ref Range Status   Specimen Description IN/OUT CATH URINE  Final   Special Requests NONE  Final   Culture   Final    NO GROWTH Performed at [facility], [place] [address] [place], [place] [address]    Report Status 04/10/2022 FINAL  Final  MRSA Next Gen by PCR, Nasal     Status: None   Collection Time: 04/13/22 10:12 AM   Specimen: Nasal Mucosa; Nasal Swab  Result Value Ref Range Status   MRSA by PCR Next Gen NOT DETECTED NOT DETECTED Final    Comment: (NOTE) The GeneXpert MRSA Assay (FDA approved for NASAL specimens only), is one component of a comprehensive MRSA colonization surveillance program. It is not intended to diagnose MRSA infection nor to guide or monitor treatment for MRSA infections. Test performance is not FDA approved in patients less than 72 years old. Performed at [facility], [place] [address], [place], [place] [address]       Radiology Studies: No results found.   Eulogio Bear DO [facility]  Between 7 am - 7 pm I am available, please contact me via Amion (for emergencies) or Securechat (non urgent messages)  Between 7 pm - 7 am I am not available, please contact night coverage MD/APP via Amion

## 2022-04-16 NOTE — Progress Notes (Signed)
[facility] ADULT ICU REPLACEMENT PROTOCOL   The patient does apply for the [facility] Adult ICU Electrolyte Replacment Protocol based on the criteria listed below:   1.Exclusion criteria: TCTS patients, ECMO patients, and Dialysis patients 2. Is GFR >/= 30 ml/min? Yes.    Patient's GFR today is >60 3. Is SCr </= 2? Yes.   Patient's SCr is 0.87 mg/dL 4. Did SCr increase >/= 0.5 in 24 hours? No. 5.Pt's weight >40kg  Yes.   6. Abnormal electrolyte(s): K 3.4, Mag 1.8  7. Electrolytes replaced per protocol 8.  Call MD STAT for K+ </= 2.5, Phos </= 1, or Mag </= 1 Physician:    Ronda Fairly A 04/16/2022 4:06 AM

## 2022-04-16 NOTE — Interval H&P Note (Signed)
History and Physical Interval Note:  04/16/2022 9:43 AM  Jordan Lane  has presented today for surgery, with the diagnosis of heart block.  The various methods of treatment have been discussed with the patient and family. After consideration of risks, benefits and other options for treatment, the patient has consented to  Procedure(s): PACEMAKER IMPLANT (N/A) as a surgical intervention.  The patient's history has been reviewed, patient examined, no change in status, stable for surgery.  I have reviewed the patient's chart and labs.  Questions were answered to the patient's satisfaction.     Demichael Traum [employer]

## 2022-04-16 NOTE — Progress Notes (Signed)
Orthopedic Tech Progress Note Patient Details:  Jordan Lane [birth year] [id number]  Patient ID: [name], female   DOB: [birth year], 76 y.o.   MRN: [id number] I spoke with RN about sling, they said patient already have the sling. Karolee Stamps 04/16/2022, 8:34 PM

## 2022-04-17 ENCOUNTER — Encounter (HOSPITAL_COMMUNITY): Payer: Self-pay | Admitting: Cardiology

## 2022-04-17 ENCOUNTER — Inpatient Hospital Stay (HOSPITAL_COMMUNITY): Payer: Medicare HMO

## 2022-04-17 DIAGNOSIS — E785 Hyperlipidemia, unspecified: Secondary | ICD-10-CM

## 2022-04-17 DIAGNOSIS — K5732 Diverticulitis of large intestine without perforation or abscess without bleeding: Secondary | ICD-10-CM | POA: Diagnosis not present

## 2022-04-17 DIAGNOSIS — G9341 Metabolic encephalopathy: Secondary | ICD-10-CM | POA: Diagnosis not present

## 2022-04-17 DIAGNOSIS — I10 Essential (primary) hypertension: Secondary | ICD-10-CM | POA: Diagnosis not present

## 2022-04-17 DIAGNOSIS — I63012 Cerebral infarction due to thrombosis of left vertebral artery: Secondary | ICD-10-CM | POA: Diagnosis not present

## 2022-04-17 LAB — BASIC METABOLIC PANEL
Anion gap: 8 (ref 5–15)
BUN: 7 mg/dL — ABNORMAL LOW (ref 8–23)
CO2: 23 mmol/L (ref 22–32)
Calcium: 8.8 mg/dL — ABNORMAL LOW (ref 8.9–10.3)
Chloride: 109 mmol/L (ref 98–111)
Creatinine, Ser: 0.89 mg/dL (ref 0.44–1.00)
GFR, Estimated: >60 mL/min (ref >60)
Glucose, Bld: 100 mg/dL — ABNORMAL HIGH (ref 70–99)
Potassium: 3.8 mmol/L (ref 3.5–5.1)
Sodium: 140 mmol/L (ref 135–145)

## 2022-04-17 LAB — CBC
HCT: 38.5 % (ref 36.0–46.0)
Hemoglobin: 12.7 g/dL (ref 12.0–15.0)
MCH: 30.5 pg (ref 26.0–34.0)
MCHC: 33 g/dL (ref 30.0–36.0)
MCV: 92.5 fL (ref 80.0–100.0)
Platelets: 310 10*3/uL (ref 150–400)
RBC: 4.16 MIL/uL (ref 3.87–5.11)
RDW: 12.9 % (ref 11.5–15.5)
WBC: 13.2 10*3/uL — ABNORMAL HIGH (ref 4.0–10.5)
nRBC: 0 % (ref 0.0–0.2)

## 2022-04-17 LAB — MAGNESIUM: Magnesium: 2 mg/dL (ref 1.7–2.4)

## 2022-04-17 MED ORDER — TRAZODONE HCL 50 MG PO TABS
50.0000 mg | ORAL_TABLET | Freq: Every evening | ORAL | Status: DC | PRN
Start: 2022-04-17 — End: 2022-04-18
  Administered 2022-04-17: 50 mg via ORAL
  Filled 2022-04-17: qty 1

## 2022-04-17 MED ORDER — GUAIFENESIN 100 MG/5ML PO LIQD: 5.0000 mL | ORAL | Status: DC | PRN

## 2022-04-17 MED ORDER — IPRATROPIUM-ALBUTEROL 0.5-2.5 (3) MG/3ML IN SOLN: 3.0000 mL | RESPIRATORY_TRACT | Status: DC | PRN

## 2022-04-17 MED ORDER — SENNOSIDES-DOCUSATE SODIUM 8.6-50 MG PO TABS: 1.0000 | ORAL_TABLET | Freq: Every evening | ORAL | Status: DC | PRN

## 2022-04-17 MED ORDER — TRAMADOL HCL 50 MG PO TABS
50.0000 mg | ORAL_TABLET | Freq: Once | ORAL | Status: AC
  Administered 2022-04-17: 50 mg via ORAL
  Filled 2022-04-17: qty 1

## 2022-04-17 MED ORDER — OXYCODONE HCL 5 MG PO TABS
5.0000 mg | ORAL_TABLET | Freq: Once | ORAL | Status: DC
  Filled 2022-04-17: qty 1

## 2022-04-17 MED ORDER — KETOROLAC TROMETHAMINE 15 MG/ML IJ SOLN
15.0000 mg | Freq: Once | INTRAMUSCULAR | Status: AC
  Administered 2022-04-17: 15 mg via INTRAVENOUS
  Filled 2022-04-17: qty 1

## 2022-04-17 MED ORDER — HYDRALAZINE HCL 20 MG/ML IJ SOLN
10.0000 mg | INTRAMUSCULAR | Status: DC | PRN
Start: 2022-04-17 — End: 2022-04-18

## 2022-04-17 NOTE — Progress Notes (Addendum)
Electrophysiology Rounding Note  Patient Name: Jordan Lane Date of Encounter: 04/17/2022  Primary Cardiologist: None Electrophysiologist: Dr. Curt Bears   Subjective   Sore this am. Otherwise doing well. Fidgeting with pressure dressing  Inpatient Medications    Scheduled Meds:  amLODipine  5 mg Oral Daily   atorvastatin  20 mg Oral QPM   Chlorhexidine Gluconate Cloth  6 each Topical Daily   enoxaparin (LOVENOX) injection  40 mg Subcutaneous Q24H   escitalopram  20 mg Oral Daily   fentaNYL (SUBLIMAZE) injection  50 mcg Intravenous Once   irbesartan  150 mg Oral Daily   lidocaine  1 patch Transdermal Q24H   mupirocin ointment  1 application. Nasal BID   pantoprazole  40 mg Oral Daily   pneumococcal 20-valent conjugate vaccine  0.5 mL Intramuscular Tomorrow-1000   QUEtiapine  25 mg Oral QHS   senna  1 tablet Oral BID   Continuous Infusions:  sodium chloride Stopped (04/11/22 0011)    ceFAZolin (ANCEF) IV 1 g (04/17/22 0402)   piperacillin-tazobactam (ZOSYN)  IV 3.375 g (04/17/22 0653)   PRN Meds: sodium chloride, acetaminophen, ALPRAZolam, diphenhydrAMINE, fluticasone, guaiFENesin, hydrALAZINE, ipratropium-albuterol, lidocaine, loperamide, senna-docusate, traZODone   Vital Signs    Vitals:   04/17/22 0400 04/17/22 0500 04/17/22 0600 04/17/22 0700  BP: (!) 140/125 126/79 (!) 159/81 (!) 148/82  Pulse:  93 91 86  Resp: '13 13 13 17  '$ Temp:      TempSrc:      SpO2:  95% 95% 98%  Weight:      Height:        Intake/Output Summary (Last 24 hours) at 04/17/2022 0749 Last data filed at 04/17/2022 0600 Gross per 24 hour  Intake 525.09 ml  Output 845 ml  Net -319.91 ml   Filed Weights   04/12/22 2148 04/14/22 0413 04/16/22 0500  Weight: 68 kg 73.4 kg 70.8 kg    Physical Exam    GEN- The patient is well appearing, alert and oriented x 3 today.   Head- normocephalic, atraumatic Eyes-  Sclera clear, conjunctiva pink Ears- hearing intact Oropharynx- clear Neck-  supple Lungs- Clear to ausculation bilaterally, normal work of breathing Heart- Regular rate and rhythm, no murmurs, rubs or gallops GI- soft, NT, ND, + BS Extremities- no clubbing or cyanosis. No edema Skin- no rash or lesion Psych- euthymic mood, full affect Neuro- strength and sensation are intact  Labs    CBC Recent Labs    04/15/22 0436 04/17/22 0122  WBC 8.1 13.2*  HGB 11.9* 12.7  HCT 35.2* 38.5  MCV 92.4 92.5  PLT 235 852   Basic Metabolic Panel Recent Labs    04/16/22 0145 04/17/22 0122  NA 140 140  K 3.4* 3.8  CL 108 109  CO2 28 23  GLUCOSE 126* 100*  BUN 5* 7*  CREATININE 0.87 0.89  CALCIUM 8.7* 8.8*  MG 1.8 2.0   Liver Function Tests Recent Labs    04/15/22 0436  AST 35  ALT 19  ALKPHOS 54  BILITOT 0.4  PROT 6.3*  ALBUMIN 2.8*   No results for input(s): LIPASE, AMYLASE in the last 72 hours. Cardiac Enzymes No results for input(s): CKTOTAL, CKMB, CKMBINDEX, TROPONINI in the last 72 hours.   Telemetry    Paced rhythm in 90s (personally reviewed)  Radiology    DG Chest 2 View  Result Date: 04/17/2022 CLINICAL DATA:  76 year old female status post cardiac device placement. EXAM: CHEST - 2 VIEW COMPARISON:  Portable chest  04/08/2022. FINDINGS: AP and lateral views at 0518 hours. Left chest dual lead cardiac pacemaker now in place. Leads course to the right atrium and RV apex region. Continued low lung volumes. No pneumothorax. No pulmonary edema, pleural effusion or consolidation identified. Stable cardiac size and mediastinal contours. Visualized tracheal air column is within normal limits. No acute osseous abnormality identified. Stable cholecystectomy clips. Negative visible bowel gas. IMPRESSION: 1. Left chest cardiac pacemaker placed with no adverse features. 2. Stable low lung volumes. Electronically Signed   By: Genevie Ann M.D.   On: 04/17/2022 06:28   EP PPM/ICD IMPLANT  Result Date: 04/16/2022 SURGEON:  Will Meredith Leeds, MD    PREPROCEDURE DIAGNOSIS:  complete AV block   POSTPROCEDURE DIAGNOSIS:  complete AV block    PROCEDURES:  1.  Pacemaker implantation.   INTRODUCTION: Jordan Lane is a 76 y.o. female  with a history of bradycardia who presents today for pacemaker implantation.  The patient reports intermittent episodes of near syncope over the past few months.  No reversible causes have been identified.  The patient therefore presents today for pacemaker implantation.   DESCRIPTION OF PROCEDURE:  Informed written consent was obtained, and  the patient was brought to the electrophysiology lab in a fasting state.  The patient required no sedation for the procedure today.  The patients left chest was prepped and draped in the usual sterile fashion by the EP lab staff. The skin overlying the left deltopectoral region was infiltrated with lidocaine for local analgesia.  A 4-cm incision was made over the left deltopectoral region.  A left subcutaneous pacemaker pocket was fashioned using a combination of sharp and blunt dissection. Electrocautery was required to assure hemostasis.  RA/RV Lead Placement: The left axillary vein was cannulated.  Through the left axillary vein, a Medtronic model 5076 (serial number [id number]) right atrial lead and a Medtronic model 3830 (serial number [id number]) right ventricular lead were advanced with fluoroscopic visualization into the right atrial appendage and right ventricular apex positions respectively.  Initial atrial lead P- waves measured 4.83m with impedance of 1023 ohms and a threshold of 1.1 V at 0.5 msec.  Right ventricular lead R-waves measured 10.9 paced mV with an impedance of 854 ohms and a threshold of 1 V at 0.5 msec.  Both leads were secured to the pectoralis fascia using #2-0 silk over the suture sleeves. Device Placement:  The leads were then connected to a Medtronic Azure XT DR MRI SureScan (serial number [id number]) pacemaker.  The pocket was irrigated with copious  gentamicin solution.  The pacemaker was then placed into the pocket.  The pocket was then closed in 3 layers with 2.0 Vicryl suture for the subcutaneous and 3.0 Vicryl suture subcuticular layers.  Steri-Strips and a sterile dressing were then applied. EBL<197m There were no early apparent complications.   CONCLUSIONS:  1. Successful implantation of a Medtronic Azure XT DR MRI SureScan dual-chamber pacemaker for symptomatic bradycardia  2. No early apparent complications.       Will MaMeredith LeedsMD 04/16/2022 3:53 PM SURGEON:  WiAllegra LaiMD   PREPROCEDURE DIAGNOSIS:  cryptogenic stroke   POSTPROCEDURE DIAGNOSIS:  cryptogenic stroke    PROCEDURES:  1. Implantable loop recorder implantation   INTRODUCTION:  ChGWENDALYNN ECKSTROMs a [age].o. female with a history of unexplained stroke who presents today for implantable loop explant.  she has had a LINQ monitor.  [facility]  DESCRIPTION OF PROCEDURE:  Informed written consent was obtained, and the patient was brought to the electrophysiology lab in a fasting state.  The patient required no sedation for the procedure today.  The patients left chest was therefore prepped and draped in the usual sterile fashion by the EP lab staff. The skin overlying the left parasternal region was infiltrated with lidocaine for local analgesia.  A 0.5-cm incision was made over the left parasternal region over the existing LINQ monitor.  Using a combination of sharp and blount dissection, the LINQ monitor was removed from the pocket.  Steri- Strips and a sterile dressing were then applied.  There were no early apparent complications.   CONCLUSIONS:  1. Successful explant of a Medtronic Reveal LINQ implantable loop recorder for cryptogenic stroke  2. No early apparent complications.    Patient Profile     76yo with HTN, GERD, arthritis, HLD, stroke in 2016 who is admitted with diverticulitis and complete heart block now s/p temporary  pacemaker placement.  Assessment & Plan    CHB S/p MDT DDD ppm 04/16/2022 by Dr. Curt Bears Wound care and arm restrictions will be reviewed once family arrives.  Usual follow up to be placed in VAS.    2. Diverticulitis 3. Nausea Per primary team. Remains on ABx through this coming weekend.    4. HTN Consider resuming valsartan with elevated BP and hypokalemia   5. Hypokalemia K 3.8 this am.    6. H/o CVA Hold plavix for procedure.  Would continue to hold at least through the weekend  For questions or updates, please contact [place] Please consult [email or web address] for contact info under Cardiology/STEMI.  Signed, [name], PA-C  04/17/2022, 7:49 AM   EP Attending  Patient seen and examined. Agree with above. The patient is doing well s/p PPM insertion. She has a pressure dressing in place. Her CXR and PM interrogation look good though I am told the atrial lead may be in a slightly different location. Her numbers are acceptable. She is stable for DC home from EP perspective though I am told she needs a couple of additional days of IV anti-biotics. Her PM interrogation under my direction demonstrates normal DDD PM function. Usual followup. We will sign off. Please leave the bandage on until she is discharged home.   Carleene Overlie Kimberly Coye,MD

## 2022-04-17 NOTE — Evaluation (Signed)
Physical Therapy Re-Evaluation Patient Details Name: Jordan Lane MRN: [id number] DOB: 1946-09-22 Today's Date: 04/17/2022  History of Present Illness  Patient is a 76 y/o female who presents on 5/10 with AMS due to sepsis secondary to diverticulitis, on 5/12 found to be in complete heart block s/p temp pacing wires placed. 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 PMH includes HTN. breast ca, bil TKR.   Clinical Impression  Pt now off bedrest with permanent pacemaker. Pt was able to progress to OOB mobility, only needing minA for bed mobility, transfers, and gait with a RW. She does display deficits in cognition, balance, activity tolerance, and gross strength that place her at risk for falls. However, pt and family report they can provide the level of care she needs and she has a RW at home she can use. Therefore, continuing to recommend HHPT to maximize her return to baseline. Will continue to follow acutely.     Recommendations for follow up therapy are one component of a multi-disciplinary discharge planning process, led by the attending physician.  Recommendations may be updated based on patient status, additional functional criteria and insurance authorization.  Follow Up Recommendations Home health PT    Assistance Recommended at Discharge Frequent or constant Supervision/Assistance  Patient can return home with the following  A little help with walking and/or transfers;A little help with bathing/dressing/bathroom;Assistance with cooking/housework;Help with stairs or ramp for entrance;Assist for transportation    Equipment Recommendations None recommended by PT  Recommendations for Other Services       Functional Status Assessment Patient has had a recent decline in their functional status and demonstrates the ability to make significant improvements in function in a reasonable and predictable amount of time.     Precautions / Restrictions Precautions Precautions:  Fall;ICD/Pacemaker Precaution Comments: reviewed precautions but did not provide handout yet Required Braces or Orthoses: Sling Restrictions Weight Bearing Restrictions: Yes Other Position/Activity Restrictions: pacemaker precautions for L UE      Mobility  Bed Mobility Overal bed mobility: Needs Assistance Bed Mobility: Supine to Sit, Sit to Supine     Supine to sit: Min assist, HOB elevated Sit to supine: Min assist   General bed mobility comments: Cues to bring legs off L EOB, needing minA to manage trunk up to sit with cues to push up from R UE. Scoots to edge with min guard. MinA to lift legs onto bed with transition to supine.    Transfers Overall transfer level: Needs assistance Equipment used: Rolling walker (2 wheels) Transfers: Sit to/from Stand Sit to Stand: Min assist           General transfer comment: Cues to push up from bed with R UE but avoid using L UE, needing minA for stability and to transition weight anteriorly as she displays a posterior bias.    Ambulation/Gait Ambulation/Gait assistance: Min guard, Min assist Gait Distance (Feet): 95 Feet Assistive device: Rolling walker (2 wheels) Gait Pattern/deviations: Step-through pattern, Decreased stride length, Shuffle, Trunk flexed Gait velocity: reduced Gait velocity interpretation: <1.31 ft/sec, indicative of household ambulator   General Gait Details: Pt with slow, small shuffling steps, needing cues to remain proximal to RW and increase speed and stride length, momentary success noted. Increased time with pt taking a wide turn to turn around. Min guard-minA for stability and guidance of RW  Stairs            Wheelchair Mobility    Modified Rankin (Stroke Patients Only)       Balance  Overall balance assessment: Needs assistance Sitting-balance support: No upper extremity supported, Feet supported Sitting balance-Leahy Scale: Fair Sitting balance - Comments: Static sitting EOB with  supervision for safety   Standing balance support: Bilateral upper extremity supported, During functional activity, Reliant on assistive device for balance Standing balance-Leahy Scale: Poor Standing balance comment: Reliant on RW and up to minA                             Pertinent Vitals/Pain Pain Assessment Pain Assessment: Faces Faces Pain Scale: Hurts a little bit Pain Location: incisional site Pain Descriptors / Indicators: Operative site guarding Pain Intervention(s): Limited activity within patient's tolerance, Monitored during session, Repositioned    Home Living Family/patient expects to be discharged to:: Private residence Living Arrangements: Spouse/significant other Available Help at Discharge: Family;Available 24 hours/day Type of Home: House Home Access: Stairs to enter Entrance Stairs-Rails: Right;Left Entrance Stairs-Number of Steps: 3 Alternate Level Stairs-Number of Steps: 1 flight Home Layout: Two level;Able to live on main level with bedroom/bathroom Home Equipment: Rolling Walker (2 wheels);Wheelchair - manual;Shower seat;Hand held shower head;Grab bars - tub/shower      Prior Function Prior Level of Function : Independent/Modified Independent             Mobility Comments: Does not drive or use AD, does some IADLs. No falls in last 6 months ADLs Comments: INdependent     Hand Dominance   Dominant Hand: Right    Extremity/Trunk Assessment   Upper Extremity Assessment Upper Extremity Assessment: Defer to OT evaluation (L UE limitations due to pacemaker precautions)    Lower Extremity Assessment Lower Extremity Assessment: Generalized weakness (noted functionally)    Cervical / Trunk Assessment Cervical / Trunk Assessment: Normal  Communication   Communication: No difficulties  Cognition Arousal/Alertness: Awake/alert Behavior During Therapy: WFL for tasks assessed/performed Overall Cognitive Status: Impaired/Different from  baseline Area of Impairment: Attention, Following commands, Memory, Awareness, Problem solving                   Current Attention Level: Selective Memory: Decreased recall of precautions, Decreased short-term memory Following Commands: Follows one step commands consistently, Follows one step commands with increased time   Awareness: Emergent Problem Solving: Slow processing General Comments: Pt slow to process cues and needs reminders for maintaining pacemaker precautions and to remain within RW. Pt having difficulty multi-tasking.        General Comments General comments (skin integrity, edema, etc.): VSS on RA; educated pt and family she needs a RW and assistance currently    Exercises     Assessment/Plan    PT Assessment Patient needs continued PT services  PT Problem List Decreased strength;Decreased mobility;Decreased cognition;Decreased activity tolerance;Decreased balance;Decreased knowledge of use of DME;Decreased knowledge of precautions       PT Treatment Interventions Therapeutic exercise;Gait training;Balance training;Patient/family education;Therapeutic activities;Functional mobility training;Stair training;DME instruction;Neuromuscular re-education;Cognitive remediation    PT Goals (Current goals can be found in the Care Plan section)  Acute Rehab PT Goals Patient Stated Goal: to get OOB PT Goal Formulation: With patient/family Time For Goal Achievement: 05/01/22 Potential to Achieve Goals: Good    Frequency Min 3X/week     Co-evaluation               AM-PAC PT "6 Clicks" Mobility  Outcome Measure Help needed turning from your back to your side while in a flat bed without using bedrails?: A Little Help needed moving from  lying on your back to sitting on the side of a flat bed without using bedrails?: A Little Help needed moving to and from a bed to a chair (including a wheelchair)?: A Little Help needed standing up from a chair using your  arms (e.g., wheelchair or bedside chair)?: A Little Help needed to walk in hospital room?: A Little Help needed climbing 3-5 steps with a railing? : A Little 6 Click Score: 18    End of Session Equipment Utilized During Treatment: Gait belt Activity Tolerance: Patient tolerated treatment well Patient left: in bed;with family/visitor present;with nursing/sitter in room (transferring to new unit) Nurse Communication: Mobility status PT Visit Diagnosis: Muscle weakness (generalized) (M62.81);Unsteadiness on feet (R26.81);Other abnormalities of gait and mobility (R26.89);Difficulty in walking, not elsewhere classified (R26.2)    Time: 6503-5465 PT Time Calculation (min) (ACUTE ONLY): 21 min   Charges:   PT Evaluation $PT Re-evaluation: 1 Re-eval          Moishe Spice, PT, DPT Acute Rehabilitation Services  Pager: [phone] Office: [place] 04/17/2022, 1:16 PM

## 2022-04-17 NOTE — Progress Notes (Signed)
PROGRESS NOTE    Jordan Lane  ZYS:[id number] DOB: 31-Dec-1945 DOA: 04/08/2022 PCP: Lavone Orn, MD   Brief Narrative:  76 year old female with HTN, CVA, GERD, IBS, who comes into the hospital with complaints of altered mental status.  She was seen in the ED for altered mental status on 04/07/2022 as a code stroke.  Neurology consulted at that time.  An MRI was negative for CVA and EEG was without seizure activity, and she was sent home on Keflex for presumed UTI.  She continued to be off of her baseline, was more confused and she was brought back.  She was febrile and hypotensive, also found to be bradycardic.  CT scan of the abdomen pelvis showed acute diverticulitis.  She was placed on antibiotics and admitted to the hospital.  Due to presence of bradycardia on admission, cardiology consulted. Found to be in complete heart block and pacemaker was successfully placed on 5/18.   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  Currently on IV Zosyn, WBC trending up slowly.  Diet as tolerated.  Pain control, antiemetics.  Complete heart block -EP cardiology is following.  Status post Medtronic dual-chamber paced maker placed on 5/18.  Continue to monitor.  Plan to repeat chest x-ray tomorrow morning per EP   Prior CVA -Continue statin.  Plavix held in anticipation for pacemaker placement, resume once cleared by cardiology   Essential hypertension -On Avapro.  IV hydralazine as needed  Acute metabolic encephalopathy -Resolved   Acute urinary retention, secondary to limited mobility -Foley  catheter placed 5/17.  We will plan on discontinuing this today and monitor her  Hyperlipidemia -continue statin   Hypokalemia, hypomagnesemia -replete as needed   Delirium -precautions added.  On Seroquel at bedtime.  Continue Lexapro   DVT prophylaxis: Lovenox Code Status: Full code Family Communication: Daughter at bedside  Status is: Inpatient Remains inpatient appropriate because: Still having some diarrhea.  EP plans on chest x-ray tomorrow morning and hopefully discharge in next 24-48 hours.   Nutritional status          Body mass index is 29.49 kg/m.         Subjective: Patient states she was awake all night as pressure dressing had bothered her.  This morning it was removed by EP.  Now feeling okay.  No new complaints.  Had about 3-4 episodes of watery diarrhea in last 24 hours.    Examination:  General exam: Appears calm and comfortable  Respiratory system: Clear to auscultation. Respiratory effort normal. Cardiovascular system: S1 & S2 heard, RRR. No JVD, murmurs, rubs, gallops or clicks. No pedal edema. Gastrointestinal system: Abdomen is nondistended, soft and nontender. No organomegaly or masses felt. Normal bowel sounds heard. Central nervous system: Alert and oriented. No focal neurological deficits. Extremities: Symmetric 5 x 5 power. Skin: No evidence of bleeding noted on her chest wall surgical site. Psychiatry: Judgement and insight appear normal. Mood & affect appropriate.     Objective: Vitals:   04/17/22 0300 04/17/22 0400 04/17/22 0500 04/17/22 0600  BP: (!) 144/82 (!) 140/125 126/79 (!) 159/81  Pulse:   93  91  Resp: _0 Temp:      TempSrc:      SpO2:   95% 95%  Weight:      Height:        Intake/Output Summary (Last 24 hours) at 04/17/2022 0734 Last data filed at 04/17/2022 0600 Gross per 24 hour  Intake 525.09 ml  Output 845 ml  Net -319.91 ml   Filed Weights   04/12/22 2148 04/14/22 0413 04/16/22 0500   Weight: 68 kg 73.4 kg 70.8 kg     Data Reviewed:   CBC: Recent Labs  Lab 04/12/22 0134 04/13/22 0109 04/14/22 0329 04/15/22 0436 04/17/22 0122  WBC 9.3 9.3 10.4 8.1 13.2*  HGB 10.8* 11.1* 12.0 11.9* 12.7  HCT 30.8* 32.0* 35.6* 35.2* 38.5  MCV 90.9 90.9 91.3 92.4 92.5  PLT 231 264 297 235 845   Basic Metabolic Panel: Recent Labs  Lab 04/11/22 0542 04/12/22 0134 04/13/22 0109 04/14/22 0329 04/15/22 0436 04/16/22 0145 04/17/22 0122  NA 143   < > 144 143 143 140 140  K 3.4*   < > 3.1* 3.4* 3.3* 3.4* 3.8  CL 108   < > 106 109 107 108 109  CO2 22   < > _1 GLUCOSE 79   < > 131* 109* 101* 126* 100*  BUN 7*   < > 7* <5* <5* 5* 7*  CREATININE 0.85   < > 0.87 0.75 0.73 0.87 0.89  CALCIUM 8.5*   < > 8.4* 8.6* 8.9 8.7* 8.8*  MG 1.8  --  1.5* 1.8  --  1.8 2.0   < > = values in this interval not displayed.   GFR: Estimated Creatinine Clearance: 47.6 mL/min (by C-G formula based on SCr of 0.89 mg/dL). Liver Function Tests: Recent Labs  Lab 04/11/22 0542 04/13/22 0109 04/15/22 0436  AST 21 24 35  ALT _2 ALKPHOS 53 51 54  BILITOT 1.3* 0.4 0.4  PROT 5.8* 5.9* 6.3*  ALBUMIN 2.6* 2.6* 2.8*   No results for input(s): LIPASE, AMYLASE in the last 168 hours. No results for input(s): AMMONIA in the last 168 hours. Coagulation Profile: No results for input(s): INR, PROTIME in the last 168 hours. Cardiac Enzymes: No results for input(s): CKTOTAL, CKMB, CKMBINDEX, TROPONINI in the last 168 hours. BNP (last 3 results) No results for input(s): PROBNP in the last 8760 hours. HbA1C: No results for input(s): HGBA1C in the last 72 hours. CBG: Recent Labs  Lab 04/10/22 2359 04/16/22 0843  GLUCAP 86 115*   Lipid Profile: No results for input(s): CHOL, HDL, LDLCALC, TRIG, CHOLHDL, LDLDIRECT in the last 72 hours. Thyroid Function Tests: No results for input(s): TSH, T4TOTAL, FREET4, T3FREE, THYROIDAB in the last 72 hours. Anemia Panel: No results for  input(s): VITAMINB12, FOLATE, FERRITIN, TIBC, IRON, RETICCTPCT in the last 72 hours. Sepsis Labs: No results for input(s): PROCALCITON, LATICACIDVEN in the last 168 hours.  Recent Results (from the past 240 hour(s))  Resp Panel by RT-PCR (Flu A&B, Covid) Nasopharyngeal Swab     Status: None   Collection Time: 04/07/22  9:42 PM   Specimen: Nasopharyngeal Swab; Nasopharyngeal(NP) swabs in vial transport medium  Result Value Ref Range Status   SARS Coronavirus 2 by RT PCR NEGATIVE NEGATIVE Final    Comment: (NOTE) SARS-CoV-2 target nucleic acids are NOT DETECTED.  The SARS-CoV-2 RNA is generally detectable in upper respiratory specimens during the acute phase of infection. The lowest concentration  of SARS-CoV-2 viral copies this assay can detect is 138 copies/mL. A negative result does not preclude SARS-Cov-2 infection and should not be used as the sole basis for treatment or other patient management decisions. A negative result may occur with  improper specimen collection/handling, submission of specimen other than nasopharyngeal swab, presence of viral mutation(s) within the areas targeted by this assay, and inadequate number of viral copies(<138 copies/mL). A negative result must be combined with clinical observations, patient history, and epidemiological information. The expected result is Negative.  Fact Sheet for Patients:  EntrepreneurPulse.com.au  Fact Sheet for Healthcare Providers:  IncredibleEmployment.be  This test is no t yet approved or cleared by the [place] FDA and  has been authorized for detection and/or diagnosis of SARS-CoV-2 by FDA under an Emergency Use Authorization (EUA). This EUA will remain  in effect (meaning this test can be used) for the duration of the COVID-19 declaration under Section 564(b)(1) of the Act, 21 U.S.C.section 360bbb-3(b)(1), unless the authorization is terminated  or revoked sooner.        Influenza A by PCR NEGATIVE NEGATIVE Final   Influenza B by PCR NEGATIVE NEGATIVE Final    Comment: (NOTE) The Xpert Xpress SARS-CoV-2/FLU/RSV plus assay is intended as an aid in the diagnosis of influenza from Nasopharyngeal swab specimens and should not be used as a sole basis for treatment. Nasal washings and aspirates are unacceptable for Xpert Xpress SARS-CoV-2/FLU/RSV testing.  Fact Sheet for Patients: EntrepreneurPulse.com.au  Fact Sheet for Healthcare Providers: IncredibleEmployment.be  This test is not yet approved or cleared by the [place] FDA and has been authorized for detection and/or diagnosis of SARS-CoV-2 by FDA under an Emergency Use Authorization (EUA). This EUA will remain in effect (meaning this test can be used) for the duration of the COVID-19 declaration under Section 564(b)(1) of the Act, 21 U.S.C. section 360bbb-3(b)(1), unless the authorization is terminated or revoked.  Performed at [facility], [place] [address], [place], [place] [address]   Urine Culture     Status: Abnormal   Collection Time: 04/08/22  4:20 AM   Specimen: Urine, Clean Catch  Result Value Ref Range Status   Specimen Description URINE, CLEAN CATCH  Final   Special Requests NONE  Final   Culture (A)  Final    10,000 COLONIES/mL GROUP B STREP(S.AGALACTIAE)ISOLATED TESTING AGAINST S. AGALACTIAE NOT ROUTINELY PERFORMED DUE TO PREDICTABILITY OF AMP/PEN/VAN SUSCEPTIBILITY. Performed at [facility], [place] [address] [place], [place] [address]    Report Status 04/09/2022 FINAL  Final  Blood Culture (routine x 2)     Status: None   Collection Time: 04/08/22 11:00 PM   Specimen: BLOOD  Result Value Ref Range Status   Specimen Description BLOOD LEFT ANTECUBITAL  Final   Special Requests   Final    BOTTLES DRAWN AEROBIC AND ANAEROBIC Blood Culture adequate volume   Culture   Final    NO GROWTH 5 DAYS Performed at [facility],  [place] [address], [place], [place] [address]    Report Status 04/13/2022 FINAL  Final  Blood Culture (routine x 2)     Status: None   Collection Time: 04/08/22 11:10 PM   Specimen: BLOOD LEFT FOREARM  Result Value Ref Range Status   Specimen Description BLOOD LEFT FOREARM  Final   Special Requests   Final    BOTTLES DRAWN AEROBIC AND ANAEROBIC Blood Culture adequate volume   Culture   Final    NO GROWTH 5  DAYS Performed at [facility], [place] [address] [place], [place] [address]    Report Status 04/13/2022 FINAL  Final  Urine Culture     Status: None   Collection Time: 04/09/22  6:15 AM   Specimen: In/Out Cath Urine  Result Value Ref Range Status   Specimen Description IN/OUT CATH URINE  Final   Special Requests NONE  Final   Culture   Final    NO GROWTH Performed at [facility], [place] [address], [place], [place] [address]    Report Status 04/10/2022 FINAL  Final  MRSA Next Gen by PCR, Nasal     Status: None   Collection Time: 04/13/22 10:12 AM   Specimen: Nasal Mucosa; Nasal Swab  Result Value Ref Range Status   MRSA by PCR Next Gen NOT DETECTED NOT DETECTED Final    Comment: (NOTE) The GeneXpert MRSA Assay (FDA approved for NASAL specimens only), is one component of a comprehensive MRSA colonization surveillance program. It is not intended to diagnose MRSA infection nor to guide or monitor treatment for MRSA infections. Test performance is not FDA approved in patients less than 56 years old. Performed at [facility], [place] [address], [place], [place] [address]   Surgical PCR screen     Status: Abnormal   Collection Time: 04/16/22  4:04 AM   Specimen: Nasal Mucosa; Nasal Swab  Result Value Ref Range Status   MRSA, PCR NEGATIVE NEGATIVE Final   Staphylococcus aureus POSITIVE (A) NEGATIVE Final    Comment: (NOTE) The Xpert SA Assay (FDA approved for NASAL specimens in patients 22 years of age and older), is one component of a comprehensive surveillance  program. It is not intended to diagnose infection nor to guide or monitor treatment. Performed at [facility], [place] [address], [place], [place] [address]          Radiology Studies: DG Chest 2 View  Result Date: 04/17/2022 CLINICAL DATA:  76 year old female status post cardiac device placement. EXAM: CHEST - 2 VIEW COMPARISON:  Portable chest 04/08/2022. FINDINGS: AP and lateral views at 0518 hours. Left chest dual lead cardiac pacemaker now in place. Leads course to the right atrium and RV apex region. Continued low lung volumes. No pneumothorax. No pulmonary edema, pleural effusion or consolidation identified. Stable cardiac size and mediastinal contours. Visualized tracheal air column is within normal limits. No acute osseous abnormality identified. Stable cholecystectomy clips. Negative visible bowel gas. IMPRESSION: 1. Left chest cardiac pacemaker placed with no adverse features. 2. Stable low lung volumes. Electronically Signed   By: Genevie Ann M.D.   On: 04/17/2022 06:28   EP PPM/ICD IMPLANT  Result Date: 04/16/2022 SURGEON:  Will Meredith Leeds, MD   PREPROCEDURE DIAGNOSIS:  complete AV block   POSTPROCEDURE DIAGNOSIS:  complete AV block    PROCEDURES:  1.  Pacemaker implantation.   INTRODUCTION: ELFIDA SHIMADA is a 76 y.o. female  with a history of bradycardia who presents today for pacemaker implantation.  The patient reports intermittent episodes of near syncope over the past few months.  No reversible causes have been identified.  The patient therefore presents today for pacemaker implantation.   DESCRIPTION OF PROCEDURE:  Informed written consent was obtained, and  the patient was brought to the electrophysiology lab in a fasting state.  The patient required no sedation for the procedure today.  The patients left chest was prepped and draped in the usual sterile fashion by the EP lab  staff. The skin overlying the left deltopectoral region was infiltrated with lidocaine for local  analgesia.  A 4-cm incision was made over the left deltopectoral region.  A left subcutaneous pacemaker pocket was fashioned using a combination of sharp and blunt dissection. Electrocautery was required to assure hemostasis.  RA/RV Lead Placement: The left axillary vein was cannulated.  Through the left axillary vein, a Medtronic model 5076 (serial number [id number]) right atrial lead and a Medtronic model 3830 (serial number [id number]) right ventricular lead were advanced with fluoroscopic visualization into the right atrial appendage and right ventricular apex positions respectively.  Initial atrial lead P- waves measured 4.41m with impedance of 1023 ohms and a threshold of 1.1 V at 0.5 msec.  Right ventricular lead R-waves measured 10.9 paced mV with an impedance of 854 ohms and a threshold of 1 V at 0.5 msec.  Both leads were secured to the pectoralis fascia using #2-0 silk over the suture sleeves. Device Placement:  The leads were then connected to a Medtronic Azure XT DR MRI SureScan (serial number [id number]) pacemaker.  The pocket was irrigated with copious gentamicin solution.  The pacemaker was then placed into the pocket.  The pocket was then closed in 3 layers with 2.0 Vicryl suture for the subcutaneous and 3.0 Vicryl suture subcuticular layers.  Steri-Strips and a sterile dressing were then applied. EBL<128m There were no early apparent complications.   CONCLUSIONS:  1. Successful implantation of a Medtronic Azure XT DR MRI SureScan dual-chamber pacemaker for symptomatic bradycardia  2. No early apparent complications.       Will MaMeredith LeedsMD 04/16/2022 3:53 PM SURGEON:  WiAllegra LaiMD   PREPROCEDURE DIAGNOSIS:  cryptogenic stroke   POSTPROCEDURE DIAGNOSIS:  cryptogenic stroke    PROCEDURES:  1. Implantable loop recorder implantation   INTRODUCTION:  ChJENTRY MCQUEARYs a [age].o. female with a history of unexplained stroke who presents today for implantable loop explant.  she has had a  LINQ monitor.  [facility]   DESCRIPTION OF PROCEDURE:  Informed written consent was obtained, and the patient was brought to the electrophysiology lab in a fasting state.  The patient required no sedation for the procedure today.  The patients left chest was therefore prepped and draped in the usual sterile fashion by the EP lab staff. The skin overlying the left parasternal region was infiltrated with lidocaine for local analgesia.  A 0.5-cm incision was made over the left parasternal region over the existing LINQ monitor.  Using a combination of sharp and blount dissection, the LINQ monitor was removed from the pocket.  Steri- Strips and a sterile dressing were then applied.  There were no early apparent complications.   CONCLUSIONS:  1. Successful explant of a Medtronic Reveal LINQ implantable loop recorder for cryptogenic stroke  2. No early apparent complications.        Scheduled Meds:  amLODipine  5 mg Oral Daily   atorvastatin  20 mg Oral QPM   Chlorhexidine Gluconate Cloth  6 each Topical Daily   enoxaparin (LOVENOX) injection  40 mg Subcutaneous Q24H   escitalopram  20 mg Oral Daily   fentaNYL (SUBLIMAZE) injection  50 mcg Intravenous Once   irbesartan  150 mg Oral Daily   lidocaine  1 patch Transdermal Q24H   mupirocin ointment  1 application. Nasal BID   pantoprazole  40 mg Oral Daily   pneumococcal 20-valent conjugate vaccine  0.5  mL Intramuscular Tomorrow-1000   QUEtiapine  25 mg Oral QHS   senna  1 tablet Oral BID   Continuous Infusions:  sodium chloride Stopped (04/11/22 0011)    ceFAZolin (ANCEF) IV 1 g (04/17/22 0402)   piperacillin-tazobactam (ZOSYN)  IV 3.375 g (04/17/22 0653)     LOS: 8 days   Time spent= 35 mins    Jamayia Croker Arsenio Loader, MD [facility]  If 7PM-7AM, please contact night-coverage  04/17/2022, 7:34 AM

## 2022-04-17 NOTE — Progress Notes (Signed)
  Reviewed with Dr. Lovena Le, Dr. Curt Bears, and Medtronic rep.   P waves slightly decreased on morning interrogation and ? Different position of a lead.   Given that impedence and threshold are stable, no urgent indication for lead revision, and would greatly increase risk of infection in this patient with concurrent diverticulitis.    We will repeat CXR and interrogation in 1-2 days to make sure lead remains stable.   Dr. Curt Bears states had mild oozing post and OK to remove pressure dressing this am for pt comfort.   Mild oozing noted on LOOP removal bandage, stable PPM site with small, soft hematoma.   Legrand Como [address]" [place], PA-C  04/17/2022 8:36 AM

## 2022-04-17 NOTE — Discharge Instructions (Signed)
After Your Pacemaker   You have a Medtronic Pacemaker  ACTIVITY Do not lift your arm above shoulder height for 1 week after your procedure. After 7 days, you may progress as below.  You should remove your sling 24 hours after your procedure, unless otherwise instructed by your provider.     Friday Apr 24, 2022  Saturday Apr 25, 2022 Sunday Apr 26, 2022 Monday Apr 27, 2022   Do not lift, push, pull, or carry anything over 10 pounds with the affected arm until 6 weeks (Friday May 29, 2022 ) after your procedure.   You may drive AFTER your wound check, unless you have been told otherwise by your provider.   Ask your healthcare provider when you can go back to work   INCISION/Dressing Do not resume your plavix until Tuesday, 5/23 at the earliest.  If large square, outer bandage is left in place, this can be removed after 24 hours from your procedure. Do not remove steri-strips or glue as below.   Monitor your Pacemaker site for redness, swelling, and drainage. Call the device clinic at [phone] if you experience these symptoms or fever/chills.  If your incision is sealed with Steri-strips or staples, you may shower 7 days after your procedure or when told by your provider. Do not remove the steri-strips or let the shower hit directly on your site. You may wash around your site with soap and water.    If you were discharged in a sling, please do not wear this during the day more than 48 hours after your surgery unless otherwise instructed. This may increase the risk of stiffness and soreness in your shoulder.   Avoid lotions, ointments, or perfumes over your incision until it is well-healed.  You may use a hot tub or a pool AFTER your wound check appointment if the incision is completely closed.  Pacemaker Alerts:  Some alerts are vibratory and others beep. These are NOT emergencies. Please call our office to let us know. If this occurs at night or on weekends, it can wait until the  next business day. Send a remote transmission.  If your device is capable of reading fluid status (for heart failure), you will be offered monthly monitoring to review this with you.   DEVICE MANAGEMENT Remote monitoring is used to monitor your pacemaker from home. This monitoring is scheduled every 91 days by our office. It allows [place] to keep an eye on the functioning of your device to ensure it is working properly. You will routinely see your Electrophysiologist annually (more often if necessary).   You should receive your ID card for your new device in 4-8 weeks. Keep this card with you at all times once received. Consider wearing a medical alert bracelet or necklace.  Your Pacemaker may be MRI compatible. This will be discussed at your next office visit/wound check.  You should avoid contact with strong electric or magnetic fields.   Do not use amateur (ham) radio equipment or electric (arc) welding torches. MP3 player headphones with magnets should not be used. Some devices are safe to use if held at least 12 inches (30 cm) from your Pacemaker. These include power tools, lawn mowers, and speakers. If you are unsure if something is safe to use, ask your health care provider.  When using your cell phone, hold it to the ear that is on the opposite side from the Pacemaker. Do not leave your cell phone in a pocket over the Pacemaker.  You  may safely use electric blankets, heating pads, computers, and microwave ovens.  Call the office right away if: You have chest pain. You feel more short of breath than you have felt before. You feel more light-headed than you have felt before. Your incision starts to open up.  This information is not intended to replace advice given to you by your health care provider. Make sure you discuss any questions you have with your health care provider.

## 2022-04-18 ENCOUNTER — Inpatient Hospital Stay (HOSPITAL_COMMUNITY): Payer: Medicare HMO

## 2022-04-18 DIAGNOSIS — K5732 Diverticulitis of large intestine without perforation or abscess without bleeding: Secondary | ICD-10-CM | POA: Diagnosis not present

## 2022-04-18 LAB — BASIC METABOLIC PANEL
Anion gap: 8 (ref 5–15)
BUN: <5 mg/dL — ABNORMAL LOW (ref 8–23)
CO2: 25 mmol/L (ref 22–32)
Calcium: 8.9 mg/dL (ref 8.9–10.3)
Chloride: 109 mmol/L (ref 98–111)
Creatinine, Ser: 0.74 mg/dL (ref 0.44–1.00)
GFR, Estimated: >60 mL/min (ref >60)
Glucose, Bld: 94 mg/dL (ref 70–99)
Potassium: 3.5 mmol/L (ref 3.5–5.1)
Sodium: 142 mmol/L (ref 135–145)

## 2022-04-18 LAB — CBC
HCT: 33.9 % — ABNORMAL LOW (ref 36.0–46.0)
Hemoglobin: 11.8 g/dL — ABNORMAL LOW (ref 12.0–15.0)
MCH: 31.8 pg (ref 26.0–34.0)
MCHC: 34.8 g/dL (ref 30.0–36.0)
MCV: 91.4 fL (ref 80.0–100.0)
Platelets: 284 10*3/uL (ref 150–400)
RBC: 3.71 MIL/uL — ABNORMAL LOW (ref 3.87–5.11)
RDW: 13.1 % (ref 11.5–15.5)
WBC: 10.4 10*3/uL (ref 4.0–10.5)
nRBC: 0 % (ref 0.0–0.2)

## 2022-04-18 LAB — MAGNESIUM: Magnesium: 1.8 mg/dL (ref 1.7–2.4)

## 2022-04-18 MED ORDER — CLOPIDOGREL BISULFATE 75 MG PO TABS: 75.0000 mg | ORAL_TABLET | Freq: Every day | ORAL | 0 refills | Status: AC

## 2022-04-18 MED ORDER — METRONIDAZOLE 500 MG PO TABS: 500.0000 mg | ORAL_TABLET | Freq: Three times a day (TID) | ORAL | 0 refills | Status: AC

## 2022-04-18 MED ORDER — LEVOFLOXACIN 750 MG PO TABS: 750.0000 mg | ORAL_TABLET | Freq: Every day | ORAL | 0 refills | Status: AC

## 2022-04-18 MED ORDER — AMLODIPINE BESYLATE 5 MG PO TABS: 5.0000 mg | ORAL_TABLET | Freq: Every day | ORAL | 0 refills | Status: AC

## 2022-04-18 NOTE — TOC Transition Note (Signed)
Transition of Care [facility]) - CM/SW Discharge Note   Patient Details  Name: [name] MRN: [id number] Date of Birth: March 15, 1946  [facility] of [facility] CM/SW Contact:  Carles Collet, RN Phone Number: 04/18/2022, 9:46 AM   Clinical Narrative:    Jordan Lane that patient will DC today. No DME needs, no other TOC needs identified.     Final next level of care: [place] Barriers to Discharge: No Barriers Identified   Patient Goals and CMS Choice Patient states their goals for this hospitalization and ongoing recovery are:: Patient wants to return home.   Choice offered to / list presented to : Patient (Patient and family did not have a preference.)  Discharge Placement                       Discharge Plan and Services In-house Referral: Clinical Social Work Discharge Planning Services: CM Consult Post Acute Care Choice: Home Health          DME Arranged: N/A DME Agency: NA       HH Arranged: OT, PT [place] Agency: [place] Date [place]: 04/18/22 Time [place]: [phone] Representative spoke with at [place]: [place] ([place]) Interventions     Readmission Risk Interventions    04/16/2022   10:31 AM  Readmission Risk Prevention Plan  Transportation Screening Complete  PCP or Specialist Appt within 3-5 Days Complete  HRI or [place] Complete  Social Work Consult for [place] Planning/Counseling Complete  Palliative Care Screening Not Applicable  Medication Review Press photographer) Complete

## 2022-04-18 NOTE — Evaluation (Signed)
Occupational Therapy Evaluation Patient Details Name: Jordan Lane MRN: [id number] DOB: August 19, 1946 Today's Date: 04/18/2022   History of Present Illness Patient is a 76 y/o female who presents on 5/10 with AMS due to sepsis secondary to diverticulitis, on 5/12 found to be in complete heart block s/p temp pacing wires placed. 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 PMH includes HTN. breast ca, bil TKR.   Clinical Impression    Pt plans go home today.  Pt will need S with ADL activity which family provides      Recommendations for follow up therapy are one component of a multi-disciplinary discharge planning process, led by the attending physician.  Recommendations may be updated based on patient status, additional functional criteria and insurance authorization.   Follow Up Recommendations  No OT follow up    Assistance Recommended at Discharge Frequent or constant Supervision/Assistance  Patient can return home with the following A little help with walking and/or transfers;A little help with bathing/dressing/bathroom    Functional Status Assessment  Patient has had a recent decline in their functional status and demonstrates the ability to make significant improvements in function in a reasonable and predictable amount of time.  Equipment Recommendations          Precautions / Restrictions Precautions Precautions: Fall;ICD/Pacemaker Restrictions Other Position/Activity Restrictions: pacemaker precautions for L UE      Mobility Bed Mobility Overal bed mobility: Independent                  Transfers   Equipment used: 1 person hand held assist   Sit to Stand: Supervision                      ADL either performed or assessed with clinical judgement   ADL Overall ADL's : At baseline                                       General ADL Comments: Overall S with ADL activity. Family A as needed     Vision   Vision Assessment?: No apparent  visual deficits            Pertinent Vitals/Pain Pain Assessment Pain Assessment: No/denies pain     Hand Dominance Right   Extremity/Trunk Assessment Upper Extremity Assessment Upper Extremity Assessment: Generalized weakness           Communication Communication Communication: No difficulties   Cognition Arousal/Alertness: Awake/alert Behavior During Therapy: WFL for tasks assessed/performed Overall Cognitive Status: Impaired/Different from baseline Area of Impairment: Attention, Following commands, Memory, Awareness, Problem solving                   Current Attention Level: Selective Memory: Decreased recall of precautions, Decreased short-term memory Following Commands: Follows one step commands consistently, Follows one step commands with increased time   Awareness: Emergent Problem Solving: Slow processing General Comments: Pt slow to process cues and needs reminders for maintaining pacemaker precautions and to remain within RW. Pt having difficulty multi-tasking.                Home Living Family/patient expects to be discharged to:: Private residence Living Arrangements: Spouse/significant other Available Help at Discharge: Family;Available 24 hours/day Type of Home: House Home Access: Stairs to enter [employer] of Steps: 3 Entrance Stairs-Rails: Right;Left Home Layout: Two level;Able to live on main level with bedroom/bathroom Alternate Level Stairs-Number of Steps:  1 flight   Bathroom Shower/Tub: Teacher, early years/pre: Standard     Home Equipment: Conservation officer, nature (2 wheels);Wheelchair - manual;Shower seat;Hand held shower head;Grab bars - tub/shower          Prior Functioning/Environment Prior Level of Function : Independent/Modified Independent             Mobility Comments: Does not drive or use AD, does some IADLs. No falls in last 6 months ADLs Comments: INdependent                 OT  Goals(Current goals can be found in the care plan section) Acute Rehab OT Goals Patient Stated Goal: home Today OT Goal Formulation: With patient  OT Frequency:         AM-PAC OT "6 Clicks" Daily Activity     Outcome Measure Help from another person eating meals?: None Help from another person taking care of personal grooming?: None Help from another person toileting, which includes using toliet, bedpan, or urinal?: A Little Help from another person bathing (including washing, rinsing, drying)?: A Little Help from another person to put on and taking off regular upper body clothing?: None Help from another person to put on and taking off regular lower body clothing?: A Little 6 Click Score: 21   End of Session    Activity Tolerance: Patient tolerated treatment well Patient left: in bed;with call bell/phone within reach;with bed alarm set                   Time: 0956-1010 OT Time Calculation (min): 14 min Charges:  OT General Charges $OT Visit: 1 Visit OT Evaluation $OT Eval Low Complexity: 1 Low  Kari Baars, OT Acute Rehabilitation Services [phone] Office- [phone]     Abbas Beyene, Edwena Felty D 04/18/2022, 10:23 AM

## 2022-04-18 NOTE — Discharge Summary (Signed)
PatientPhysician Discharge Summary  Jordan Lane:[id number] DOB: 1946/03/17 DOA: 04/08/2022  PCP: Lavone Orn, MD  Admit date: 04/08/2022 Discharge date: 04/18/2022 30 Day Unplanned Readmission Risk Score    Flowsheet Row ED to Hosp-Admission (Current) from 04/08/2022 in [facility]  30 Day Unplanned Readmission Risk Score (%) 20.83 Filed at 04/18/2022 0801       This score is the patient's risk of an unplanned readmission within 30 days of being discharged (0 -100%). The score is based on dignosis, age, lab data, medications, orders, and past utilization.   Low:  0-14.9   Medium: 15-21.9   High: 22-29.9   Extreme: 30 and above          Admitted From: Home Disposition: Home  Recommendations for Outpatient Follow-up:  Follow up with PCP in 1-2 weeks Please obtain BMP/CBC in one week Follow-up with cardiology as they have scheduled Please follow up with your PCP on the following pending results: Unresulted Labs (From admission, onward)     Start     Ordered   04/16/22 0500  Creatinine, serum  (enoxaparin (LOVENOX)    CrCl >/= 30 ml/min)  Weekly,   R     Comments: while on enoxaparin therapy    04/09/22 0425              Home Health: Yes Equipment/Devices: None  Discharge Condition: Stable CODE STATUS: Full code Diet recommendation: Cardiac  Subjective: Seen and examined.  Feels better.  No complaints.  No chest pain, shortness of breath, abdominal pain, diarrhea, nausea or vomiting.  She is ready to go home she says.  Brief/Interim Summary: 76 year old female with HTN, CVA, GERD, IBS, who presented into the hospital with complaints of altered mental status.  She was seen in the ED for altered mental status on 04/07/2022 as a code stroke.  Neurology consulted at that time.  An MRI was negative for CVA and EEG was without seizure activity, and she was sent home on Keflex for presumed UTI.  She continued to be off of her baseline, was more  confused and she was brought back.  She was febrile and hypotensive, also found to be bradycardic.  CT scan of the abdomen pelvis showed acute diverticulitis.  She was placed on antibiotics and admitted to the hospital.  Due to presence of bradycardia on admission, cardiology consulted. Found to be in complete heart block and pacemaker was successfully placed on 5/18.  Of note, she also met criteria for sepsis secondary to acute diverticulitis, she received 9 days of Zosyn.  Now that she is feeling much better and tolerating diet, she is being discharged on 3 more days of oral Levaquin and Flagyl.  Complete heart block -EP cardiology is following.  Status post Medtronic dual-chamber paced maker placed on 5/18.  Seen by cardiology yesterday and cleared for discharge.  Follow-up with cardiology as scheduled.  She had very minimal oozing noted on loop removal bandage but PPM site was stable with soft hematoma.  She had Steri-Strips.  There was no blood noted today.  She still had mild hematoma.   Prior CVA -Continue statin.  Plavix held in anticipation for pacemaker placement, per cardiology note from yesterday, the preferred to hold the Plavix over the weekend so I have advised her to resume on Monday.   Essential hypertension: Blood pressure controlled resume home medications.  Acute metabolic encephalopathy -Resolved   Acute urinary retention, secondary to limited mobility -Foley catheter placed 5/17  and removed on 04/17/2022.  She has been passing urine without any trouble.  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)   Heart block AV complete ([place])   Acute metabolic encephalopathy   Hypokalemia    Discharge Instructions   Allergies as of 04/18/2022       Reactions   Nexium [esomeprazole]  Diarrhea   Codeine Nausea And Vomiting   Lisinopril Cough   Lorazepam    Other reaction(s): severe agitation with delirium    Losartan Potassium-hctz Other (See Comments)   Other reaction(s): dizzy   Omeprazole Diarrhea   Other reaction(s): diarrhea   Paroxetine Nausea Only   Sertraline Hcl Nausea Only   Zofran [ondansetron]         Medication List     STOP taking these medications    cephALEXin 500 MG capsule Commonly known as: KEFLEX       TAKE these medications    ALPRAZolam 0.25 MG tablet Commonly known as: XANAX Take 0.25 mg by mouth at bedtime as needed for anxiety or sleep.   amLODipine 5 MG tablet Commonly known as: NORVASC Take 1 tablet (5 mg total) by mouth daily. Start taking on: Apr 19, 2022   Aspercreme Lidocaine 4 % Liqd Generic drug: Lidocaine HCl Apply 1 application. topically daily as needed (pain).   atorvastatin 20 MG tablet Commonly known as: LIPITOR Take 1 tablet (20 mg total) by mouth daily at 6 PM. What changed: when to take this   cholecalciferol 1000 units tablet Commonly known as: VITAMIN D Take 1,000 Units by mouth every evening.   clopidogrel 75 MG tablet Commonly known as: PLAVIX Take 1 tablet (75 mg total) by mouth daily. Start taking on: Apr 20, 2022 What changed: These instructions start on Apr 20, 2022. If you are unsure what to do until then, ask your doctor or other care provider.   diphenhydrAMINE 25 mg capsule Commonly known as: BENADRYL Take 25 mg by mouth every 6 (six) hours as needed for allergies.   escitalopram 20 MG tablet Commonly known as: LEXAPRO Take 20 mg by mouth daily.   famotidine 20 MG tablet Commonly known as: PEPCID Take 20 mg by mouth daily as needed for heartburn or indigestion.   fluticasone 50 MCG/ACT nasal spray Commonly known as: FLONASE Place 1 spray into both nostrils daily as needed for allergies or rhinitis.   Klor-Con M20 20 MEQ tablet Generic drug: potassium chloride SA Take 20  mEq by mouth every evening.   levETIRAcetam 500 MG tablet Commonly known as: Keppra Take 1 tablet (500 mg total) by mouth 2 (two) times daily.   levofloxacin 750 MG tablet Commonly known as: Levaquin Take 1 tablet (750 mg total) by mouth daily for 3 days.   loperamide 2 MG tablet Commonly known as: IMODIUM A-D Take 2 mg by mouth 4 (four) times daily as needed for diarrhea or loose stools.   metroNIDAZOLE 500 MG tablet Commonly known as: Flagyl Take 1 tablet (500 mg total) by mouth 3 (three) times daily for 3 days.   multivitamin tablet Take 1 tablet by mouth every evening.   omeprazole 20 MG capsule Commonly known as: PRILOSEC Take 20 mg by mouth daily.   valsartan-hydrochlorothiazide 160-25 MG tablet Commonly known as: DIOVAN-HCT Take 1 tablet by mouth every evening.   vitamin B-12  100 MCG tablet Commonly known as: CYANOCOBALAMIN Take 100 mcg by mouth every evening.        Follow-up Information     [facility], [facility] Follow up.   Specialty: Home Health Services Why: Physical Therapy and Occupational Therapy-office to call with visit times. Contact information: [place] [address] [phone]         Lavone Orn, MD Follow up in 1 week(s).   Specialty: Internal Medicine Contact information: [address], [address] [place]  [address] [phone]                Allergies  Allergen Reactions   Nexium [Esomeprazole] Diarrhea   Codeine Nausea And Vomiting   Lisinopril Cough   Lorazepam     Other reaction(s): severe agitation with delirium    Losartan Potassium-Hctz Other (See Comments)    Other reaction(s): dizzy   Omeprazole Diarrhea    Other reaction(s): diarrhea   Paroxetine Nausea Only   Sertraline Hcl Nausea Only   Zofran [Ondansetron]     Consultations: Cardiology   Procedures/Studies: DG Chest 2 View  Result Date: 04/18/2022 CLINICAL DATA:  Pacemaker follow-up EXAM: CHEST - 2 VIEW  COMPARISON:  Apr 17, 2022 FINDINGS: The pacemaker is in stable position with leads in the right atrium and right ventricle. The heart, hila, and mediastinum are normal. No pneumothorax. No nodules or masses. No suspicious infiltrates. No other acute abnormalities. IMPRESSION: Pacemaker placement as above. No change in position of the leads. No pneumothorax. Electronically Signed   By: Dorise Bullion III M.D.   On: 04/18/2022 08:28   DG Chest 2 View  Result Date: 04/17/2022 CLINICAL DATA:  76 year old female status post cardiac device placement. EXAM: CHEST - 2 VIEW COMPARISON:  Portable chest 04/08/2022. FINDINGS: AP and lateral views at 0518 hours. Left chest dual lead cardiac pacemaker now in place. Leads course to the right atrium and RV apex region. Continued low lung volumes. No pneumothorax. No pulmonary edema, pleural effusion or consolidation identified. Stable cardiac size and mediastinal contours. Visualized tracheal air column is within normal limits. No acute osseous abnormality identified. Stable cholecystectomy clips. Negative visible bowel gas. IMPRESSION: 1. Left chest cardiac pacemaker placed with no adverse features. 2. Stable low lung volumes. Electronically Signed   By: Genevie Ann M.D.   On: 04/17/2022 06:28   CT Head Wo Contrast  Result Date: 04/08/2022 CLINICAL DATA:  Altered mental status. EXAM: CT HEAD WITHOUT CONTRAST TECHNIQUE: Contiguous axial images were obtained from the base of the skull through the vertex without intravenous contrast. RADIATION DOSE REDUCTION: This exam was performed according to the departmental dose-optimization program which includes automated exposure control, adjustment of the mA and/or kV according to patient size and/or use of iterative reconstruction technique. COMPARISON:  September 11, 2021 FINDINGS: Brain: There is mild cerebral atrophy with widening of the extra-axial spaces and ventricular dilatation. There are areas of decreased attenuation within the  white matter tracts of the supratentorial brain, consistent with microvascular disease changes. Vascular: No hyperdense vessel or unexpected calcification. Skull: Normal. Negative for fracture or focal lesion. Sinuses/Orbits: No acute finding. Other: None. IMPRESSION: 1. No acute intracranial abnormality. 2. Generalized cerebral atrophy with chronic white matter small vessel ischemic changes. Electronically Signed   By: Virgina Norfolk M.D.   On: 04/08/2022 23:44   MR BRAIN W WO CONTRAST  Result Date: 04/08/2022 CLINICAL DATA:  Canceled stroke workup.  Seizure evaluation. EXAM: MRI HEAD WITHOUT AND WITH CONTRAST  TECHNIQUE: Multiplanar, multiecho pulse sequences of the brain and surrounding structures were obtained without and with intravenous contrast. CONTRAST:  17mL GADAVIST GADOBUTROL 1 MMOL/ML IV SOLN COMPARISON:  09/12/2021 FINDINGS: Brain: No acute infarction, hemorrhage, hydrocephalus, extra-axial collection or mass lesion. Confluent FLAIR hyperintensity in the cerebral white matter with superimposed lacunar infarcts in the bilateral centrum semiovale (with wallerian degeneration crossing the corpus callosum) and in the right thalamus. Accentuated dilated perivascular spaces at the basal ganglia. Cerebral volume loss is preserved for age. No chronic hemorrhagic injury Vascular: Major flow voids and vascular enhancements are preserved Skull and upper cervical spine: Normal marrow signal Sinuses/Orbits: Bilateral cataract resection. Essentially clear sinuses. Other: Intermittent, progressive motion artifact. IMPRESSION: 1. No acute finding.  No focal cortical abnormality. 2. Extensive chronic small vessel ischemia. 3. Motion artifact. Electronically Signed   By: Jorje Guild M.D.   On: 04/08/2022 04:06   CT Abdomen Pelvis W Contrast  Result Date: 04/08/2022 CLINICAL DATA:  Right lower quadrant pain. EXAM: CT ABDOMEN AND PELVIS WITH CONTRAST TECHNIQUE: Multidetector CT imaging of the abdomen and  pelvis was performed using the standard protocol following bolus administration of intravenous contrast. RADIATION DOSE REDUCTION: This exam was performed according to the departmental dose-optimization program which includes automated exposure control, adjustment of the mA and/or kV according to patient size and/or use of iterative reconstruction technique. CONTRAST:  17mL OMNIPAQUE IOHEXOL 300 MG/ML  SOLN COMPARISON:  None Available. FINDINGS: Lower chest: No acute abnormality. Hepatobiliary: No focal liver abnormality is seen. Status post cholecystectomy. No biliary dilatation. Pancreas: Unremarkable. No pancreatic ductal dilatation or surrounding inflammatory changes. Spleen: Normal in size without focal abnormality. Adrenals/Urinary Tract: The right adrenal gland is unremarkable. An 8 mm diameter isodense (approximately 113.58 Hounsfield units) left adrenal mass is seen (axial CT image 23, CT series 3). Kidneys are normal, without renal calculi, focal lesion, or hydronephrosis. Bladder is unremarkable. Stomach/Bowel: Stomach is within normal limits. Appendix appears normal. No evidence of bowel dilatation. Markedly inflamed diverticula are seen within the distal descending colon. There is no evidence of associated perforation or abscess. Vascular/Lymphatic: Aortic atherosclerosis. No enlarged abdominal or pelvic lymph nodes. Reproductive: The uterus is surgically absent. A 2.9 cm x 1.7 cm simple cyst is seen within the left adnexa. Other: No abdominal wall hernia or abnormality. No abdominopelvic ascites. Musculoskeletal: Multilevel degenerative changes are seen throughout the lumbar spine. IMPRESSION: 1. Acute diverticulitis involving the distal descending colon without evidence of associated perforation or abscess. 2. Evidence of prior cholecystectomy. 3. Left sub-cm adrenal mass, probably benign. No follow-up imaging is recommended. JACR 2017 Aug; 14(8):1038-44, JCAT 2016 Mar-Apr; 40(2):194-200, Urol J 2006  Spring; 3(2):71-4. 4. Left adnexal simple cyst likely ovarian in origin. 5. Aortic atherosclerosis. Aortic Atherosclerosis (ICD10-I70.0). Electronically Signed   By: Virgina Norfolk M.D.   On: 04/08/2022 23:51   CARDIAC CATHETERIZATION  Result Date: 04/10/2022 Successful TVP for CHB.   EP PPM/ICD IMPLANT  Result Date: 04/16/2022 SURGEON:  Will Meredith Leeds, MD   PREPROCEDURE DIAGNOSIS:  complete AV block   POSTPROCEDURE DIAGNOSIS:  complete AV block    PROCEDURES:  1.  Pacemaker implantation.   INTRODUCTION: AIRIANNA KREISCHER is a 76 y.o. female  with a history of bradycardia who presents today for pacemaker implantation.  The patient reports intermittent episodes of near syncope over the past few months.  No reversible causes have been identified.  The patient therefore presents today for pacemaker implantation.   DESCRIPTION OF PROCEDURE:  Informed written consent was obtained, and  the patient was brought to the electrophysiology lab in a fasting state.  The patient required no sedation for the procedure today.  The patients left chest was prepped and draped in the usual sterile fashion by the EP lab staff. The skin overlying the left deltopectoral region was infiltrated with lidocaine for local analgesia.  A 4-cm incision was made over the left deltopectoral region.  A left subcutaneous pacemaker pocket was fashioned using a combination of sharp and blunt dissection. Electrocautery was required to assure hemostasis.  RA/RV Lead Placement: The left axillary vein was cannulated.  Through the left axillary vein, a Medtronic model 5076 (serial number [id number]) right atrial lead and a Medtronic model 3830 (serial number [id number]) right ventricular lead were advanced with fluoroscopic visualization into the right atrial appendage and right ventricular apex positions respectively.  Initial atrial lead P- waves measured 4.12mV with impedance of 1023 ohms and a threshold of 1.1 V at 0.5 msec.  Right  ventricular lead R-waves measured 10.9 paced mV with an impedance of 854 ohms and a threshold of 1 V at 0.5 msec.  Both leads were secured to the pectoralis fascia using #2-0 silk over the suture sleeves. Device Placement:  The leads were then connected to a Medtronic Azure XT DR MRI SureScan (serial number [id number]) pacemaker.  The pocket was irrigated with copious gentamicin solution.  The pacemaker was then placed into the pocket.  The pocket was then closed in 3 layers with 2.0 Vicryl suture for the subcutaneous and 3.0 Vicryl suture subcuticular layers.  Steri-Strips and a sterile dressing were then applied. EBL<51ml. There were no early apparent complications.   CONCLUSIONS:  1. Successful implantation of a Medtronic Azure XT DR MRI SureScan dual-chamber pacemaker for symptomatic bradycardia  2. No early apparent complications.       Will Meredith Leeds, MD 04/16/2022 3:53 PM SURGEON:  Allegra Lai, MD   PREPROCEDURE DIAGNOSIS:  cryptogenic stroke   POSTPROCEDURE DIAGNOSIS:  cryptogenic stroke    PROCEDURES:  1. Implantable loop recorder implantation   INTRODUCTION:  DEVOIRY CORRIHER is a 76 y.o. female with a history of unexplained stroke who presents today for implantable loop explant.  she has had a LINQ monitor.  LINQ is now at [facility] and wishes the monitor explanted.   DESCRIPTION OF PROCEDURE:  Informed written consent was obtained, and the patient was brought to the electrophysiology lab in a fasting state.  The patient required no sedation for the procedure today.  The patients left chest was therefore prepped and draped in the usual sterile fashion by the EP lab staff. The skin overlying the left parasternal region was infiltrated with lidocaine for local analgesia.  A 0.5-cm incision was made over the left parasternal region over the existing LINQ monitor.  Using a combination of sharp and blount dissection, the LINQ monitor was removed from the pocket.  Steri- Strips and a sterile dressing were then  applied.  There were no early apparent complications.   CONCLUSIONS:  1. Successful explant of a Medtronic Reveal LINQ implantable loop recorder for cryptogenic stroke  2. No early apparent complications.   DG Chest Port 1 View  Result Date: 04/08/2022 CLINICAL DATA:  Questionable sepsis. EXAM: PORTABLE CHEST 1 VIEW COMPARISON:  November 16, 2013 FINDINGS: The heart size and mediastinal contours are within normal limits. There is moderate severity calcification of the aortic arch. Low lung volumes are noted. Both lungs are clear. Degenerative changes seen throughout the thoracic spine. IMPRESSION:  No active disease. Electronically Signed   By: Virgina Norfolk M.D.   On: 04/08/2022 23:08   EEG adult  Result Date: 04/08/2022 Lora Havens, MD     04/08/2022  8:45 AM Patient Name: JAMILEE LAFOSSE MRN: [id number] Epilepsy Attending: Lora Havens Referring Physician/Provider: Donnetta Simpers, MD Date: 04/08/2022 Duration: 22.53 mins Patient history:  76 y.o. female with PMH significant for hypertension, GERD, hyperlipidemia, prior episode of aphasia that resolved and concerning for a potential TIA vs aborted stroke from tNKASE who presents with recurrence of an episode of aphasia. EEG to evaluate for seizure. Level of alertness: Awake, asleep AEDs during EEG study: LEV Technical aspects: This EEG study was done with scalp electrodes positioned according to the 10-20 International system of electrode placement. Electrical activity was acquired at a sampling rate of $Remov'500Hz'YlRvrZ$  and reviewed with a high frequency filter of $RemoveB'70Hz'MPQtkIsP$  and a low frequency filter of $RemoveB'1Hz'TkJidFRH$ . EEG data were recorded continuously and digitally stored. Description: The posterior dominant rhythm consists of 7.5 Hz activity of moderate voltage (25-35 uV) seen predominantly in posterior head regions, symmetric and reactive to eye opening and eye closing. Sleep was characterized by vertex waves, sleep spindles (12 to 14 Hz), maximal frontocentral  region.  Hyperventilation and photic stimulation were not performed.   IMPRESSION: This study is within normal limits. No seizures or epileptiform discharges were seen throughout the recording. Lora Havens   CUP PACEART REMOTE DEVICE CHECK  Result Date: 04/09/2022 ILR summary report received. Battery status OK. Normal device function. No new symptom, tachy, brady, or pause episodes. No new AF episodes. Monthly summary reports and ROV/PRN LA    Discharge Exam: Vitals:   04/18/22 0441 04/18/22 0756  BP: (!) 151/83 (!) 154/58  Pulse: 85 82  Resp: 17 17  Temp: 98.5 F (36.9 C) 98.8 F (37.1 C)  SpO2: 93% 93%   Vitals:   04/17/22 1540 04/17/22 1915 04/18/22 0441 04/18/22 0756  BP: (!) 156/62 (!) 146/64 (!) 151/83 (!) 154/58  Pulse: 77 79 85 82  Resp: $Remo'18 15 17 17  'FxiBw$ Temp: 98.5 F (36.9 C) 98.4 F (36.9 C) 98.5 F (36.9 C) 98.8 F (37.1 C)  TempSrc: Oral Oral Oral Oral  SpO2: 96% 96% 93% 93%  Weight:   68.4 kg   Height:        General: Pt is alert, awake, not in acute distress Cardiovascular: RRR, S1/S2 +, no rubs, no gallops Respiratory: CTA bilaterally, no wheezing, no rhonchi Abdominal: Soft, NT, ND, bowel sounds + Extremities: no edema, no cyanosis    The results of significant diagnostics from this hospitalization (including imaging, microbiology, ancillary and laboratory) are listed below for reference.     Microbiology: Recent Results (from the past 240 hour(s))  Blood Culture (routine x 2)     Status: None   Collection Time: 04/08/22 11:00 PM   Specimen: BLOOD  Result Value Ref Range Status   Specimen Description BLOOD LEFT ANTECUBITAL  Final   Special Requests   Final    BOTTLES DRAWN AEROBIC AND ANAEROBIC Blood Culture adequate volume   Culture   Final    NO GROWTH 5 DAYS Performed at [facility], [address] [place], [place] [address]    Report Status 04/13/2022 FINAL  Final  Blood Culture (routine x 2)     Status: None   Collection Time:  04/08/22 11:10 PM   Specimen: BLOOD LEFT FOREARM  Result Value Ref Range Status   Specimen  Description BLOOD LEFT FOREARM  Final   Special Requests   Final    BOTTLES DRAWN AEROBIC AND ANAEROBIC Blood Culture adequate volume   Culture   Final    NO GROWTH 5 DAYS Performed at [facility], [address], [place], [place] [address]    Report Status 04/13/2022 FINAL  Final  Urine Culture     Status: None   Collection Time: 04/09/22  6:15 AM   Specimen: In/Out Cath Urine  Result Value Ref Range Status   Specimen Description IN/OUT CATH URINE  Final   Special Requests NONE  Final   Culture   Final    NO GROWTH Performed at [facility], [place] [address] [place], [place] [address]    Report Status 04/10/2022 FINAL  Final  MRSA Next Gen by PCR, Nasal     Status: None   Collection Time: 04/13/22 10:12 AM   Specimen: Nasal Mucosa; Nasal Swab  Result Value Ref Range Status   MRSA by PCR Next Gen NOT DETECTED NOT DETECTED Final    Comment: (NOTE) The GeneXpert MRSA Assay (FDA approved for NASAL specimens only), is one component of a comprehensive MRSA colonization surveillance program. It is not intended to diagnose MRSA infection nor to guide or monitor treatment for MRSA infections. Test performance is not FDA approved in patients less than 63 years old. Performed at [facility], [place] [address], [place], [place] [address]   Surgical PCR screen     Status: Abnormal   Collection Time: 04/16/22  4:04 AM   Specimen: Nasal Mucosa; Nasal Swab  Result Value Ref Range Status   MRSA, PCR NEGATIVE NEGATIVE Final   Staphylococcus aureus POSITIVE (A) NEGATIVE Final    Comment: (NOTE) The Xpert SA Assay (FDA approved for NASAL specimens in patients 23 years of age and older), is one component of a comprehensive surveillance program. It is not intended to diagnose infection nor to guide or monitor treatment. Performed at [facility], [place] [address], [place],  [place] [address]      Labs: BNP (last 3 results) No results for input(s): BNP in the last 8760 hours. Basic Metabolic Panel: Recent Labs  Lab 04/13/22 0109 04/14/22 0329 04/15/22 0436 04/16/22 0145 04/17/22 0122 04/18/22 0318  NA 144 143 143 140 140 142  K 3.1* 3.4* 3.3* 3.4* 3.8 3.5  CL 106 109 107 108 109 109  CO2 $Re'30 24 28 28 23 25  'XyS$ GLUCOSE 131* 109* 101* 126* 100* 94  BUN 7* <5* <5* 5* 7* <5*  CREATININE 0.87 0.75 0.73 0.87 0.89 0.74  CALCIUM 8.4* 8.6* 8.9 8.7* 8.8* 8.9  MG 1.5* 1.8  --  1.8 2.0 1.8   Liver Function Tests: Recent Labs  Lab 04/13/22 0109 04/15/22 0436  AST 24 35  ALT 14 19  ALKPHOS 51 54  BILITOT 0.4 0.4  PROT 5.9* 6.3*  ALBUMIN 2.6* 2.8*   No results for input(s): LIPASE, AMYLASE in the last 168 hours. No results for input(s): AMMONIA in the last 168 hours. CBC: Recent Labs  Lab 04/13/22 0109 04/14/22 0329 04/15/22 0436 04/17/22 0122 04/18/22 0318  WBC 9.3 10.4 8.1 13.2* 10.4  HGB 11.1* 12.0 11.9* 12.7 11.8*  HCT 32.0* 35.6* 35.2* 38.5 33.9*  MCV 90.9 91.3 92.4 92.5 91.4  PLT 264 297 235 310 284   Cardiac Enzymes: No results for input(s): CKTOTAL, CKMB, CKMBINDEX, TROPONINI in the last 168 hours. BNP: Invalid input(s): POCBNP CBG:  Recent Labs  Lab 04/16/22 0843  GLUCAP 115*   D-Dimer No results for input(s): DDIMER in the last 72 hours. Hgb A1c No results for input(s): HGBA1C in the last 72 hours. Lipid Profile No results for input(s): CHOL, HDL, LDLCALC, TRIG, CHOLHDL, LDLDIRECT in the last 72 hours. Thyroid function studies No results for input(s): TSH, T4TOTAL, T3FREE, THYROIDAB in the last 72 hours.  Invalid input(s): FREET3 Anemia work up No results for input(s): VITAMINB12, FOLATE, FERRITIN, TIBC, IRON, RETICCTPCT in the last 72 hours. Urinalysis    Component Value Date/Time   COLORURINE YELLOW 04/09/2022 0613   APPEARANCEUR CLEAR 04/09/2022 0613   LABSPEC >1.046 (H) 04/09/2022 0613   PHURINE 5.0 04/09/2022 0613    GLUCOSEU NEGATIVE 04/09/2022 0613   HGBUR NEGATIVE 04/09/2022 0613   BILIRUBINUR NEGATIVE 04/09/2022 0613   KETONESUR 5 (A) 04/09/2022 0613   PROTEINUR NEGATIVE 04/09/2022 0613   UROBILINOGEN 0.2 11/16/2013 1124   NITRITE NEGATIVE 04/09/2022 0613   LEUKOCYTESUR NEGATIVE 04/09/2022 3149   Sepsis Labs Invalid input(s): PROCALCITONIN,  WBC,  LACTICIDVEN Microbiology Recent Results (from the past 240 hour(s))  Blood Culture (routine x 2)     Status: None   Collection Time: 04/08/22 11:00 PM   Specimen: BLOOD  Result Value Ref Range Status   Specimen Description BLOOD LEFT ANTECUBITAL  Final   Special Requests   Final    BOTTLES DRAWN AEROBIC AND ANAEROBIC Blood Culture adequate volume   Culture   Final    NO GROWTH 5 DAYS Performed at [facility], [place] [address] [place], [place] [address]    Report Status 04/13/2022 FINAL  Final  Blood Culture (routine x 2)     Status: None   Collection Time: 04/08/22 11:10 PM   Specimen: BLOOD LEFT FOREARM  Result Value Ref Range Status   Specimen Description BLOOD LEFT FOREARM  Final   Special Requests   Final    BOTTLES DRAWN AEROBIC AND ANAEROBIC Blood Culture adequate volume   Culture   Final    NO GROWTH 5 DAYS Performed at [facility], [place] [address] [place], [place] [address]    Report Status 04/13/2022 FINAL  Final  Urine Culture     Status: None   Collection Time: 04/09/22  6:15 AM   Specimen: In/Out Cath Urine  Result Value Ref Range Status   Specimen Description IN/OUT CATH URINE  Final   Special Requests NONE  Final   Culture   Final    NO GROWTH Performed at [facility], [place] [address], [place],  [address]    Report Status 04/10/2022 FINAL  Final  MRSA Next Gen by PCR, Nasal     Status: None   Collection Time: 04/13/22 10:12 AM   Specimen: Nasal Mucosa; Nasal Swab  Result Value Ref Range Status   MRSA by PCR Next Gen NOT DETECTED NOT DETECTED Final    Comment: (NOTE) The GeneXpert MRSA  Assay (FDA approved for NASAL specimens only), is one component of a comprehensive MRSA colonization surveillance program. It is not intended to diagnose MRSA infection nor to guide or monitor treatment for MRSA infections. Test performance is not FDA approved in patients less than 85 years old. Performed at [facility], [place] [address] [place],  [address]   Surgical PCR screen     Status: Abnormal   Collection Time: 04/16/22  4:04 AM   Specimen: Nasal Mucosa; Nasal Swab  Result Value Ref Range Status  MRSA, PCR NEGATIVE NEGATIVE Final   Staphylococcus aureus POSITIVE (A) NEGATIVE Final    Comment: (NOTE) The Xpert SA Assay (FDA approved for NASAL specimens in patients 42 years of age and older), is one component of a comprehensive surveillance program. It is not intended to diagnose infection nor to guide or monitor treatment. Performed at [facility], [place] [address] [place], [place] [address]      Time coordinating discharge: Over 30 minutes  SIGNED:   Darliss Cheney, MD  [facility] 04/18/2022, 9:37 AM *Please note that this is a verbal dictation therefore any spelling or grammatical errors are due to the "[employer]" system interpretation. If 7PM-7AM, please contact night-coverage [email or web address]

## 2022-04-20 NOTE — Progress Notes (Signed)
Carelink Summary Report / Loop RecorderCarelink

## 2022-04-21 ENCOUNTER — Other Ambulatory Visit: Payer: Self-pay

## 2022-04-21 ENCOUNTER — Encounter: Payer: Self-pay | Admitting: Cardiology

## 2022-04-21 NOTE — Patient Outreach (Signed)
[facility]) Care Management  04/21/2022  Jordan Lane [birth year] [id number]   Patient recently hospitalized.  Patient referred to embedded CCM program.  RN CM will close case.  Jone Baseman, RN, MSN [facility] Care Management Care Management Coordinator Direct Line [phone] Toll Free: [phone]  Fax: [phone]

## 2022-04-22 ENCOUNTER — Ambulatory Visit: Payer: Medicare HMO | Admitting: Adult Health

## 2022-04-23 ENCOUNTER — Telehealth: Payer: Self-pay

## 2022-04-23 ENCOUNTER — Other Ambulatory Visit: Payer: Self-pay

## 2022-04-23 DIAGNOSIS — R5381 Other malaise: Secondary | ICD-10-CM | POA: Diagnosis not present

## 2022-04-23 DIAGNOSIS — R0683 Snoring: Secondary | ICD-10-CM | POA: Diagnosis not present

## 2022-04-23 DIAGNOSIS — R197 Diarrhea, unspecified: Secondary | ICD-10-CM | POA: Diagnosis not present

## 2022-04-23 DIAGNOSIS — I442 Atrioventricular block, complete: Secondary | ICD-10-CM | POA: Diagnosis not present

## 2022-04-23 DIAGNOSIS — Z8719 Personal history of other diseases of the digestive system: Secondary | ICD-10-CM | POA: Diagnosis not present

## 2022-04-23 DIAGNOSIS — I700 Atherosclerosis of aorta: Secondary | ICD-10-CM | POA: Diagnosis not present

## 2022-04-23 DIAGNOSIS — I10 Essential (primary) hypertension: Secondary | ICD-10-CM | POA: Diagnosis not present

## 2022-04-23 DIAGNOSIS — D692 Other nonthrombocytopenic purpura: Secondary | ICD-10-CM | POA: Diagnosis not present

## 2022-04-23 NOTE — Patient Outreach (Signed)
[facility]) Care Management  04/23/2022  Jordan Lane 30-Jan-1946 [id number]  Follow up: [name] Medicare  PCP: [facility] Physician, Embedded, has external care management team available.  Call attempt to follow up on post hospital needs for care coordination due to patient was previously active with [facility] for care coordination prior to admission. No answer to call.  Natividad Brood, RN BSN [facility] Liaison  [phone] business mobile phone Toll free office [phone]  Fax number: [phone] [email or web address]

## 2022-04-29 ENCOUNTER — Ambulatory Visit (INDEPENDENT_AMBULATORY_CARE_PROVIDER_SITE_OTHER): Payer: Medicare HMO

## 2022-04-29 DIAGNOSIS — I442 Atrioventricular block, complete: Secondary | ICD-10-CM

## 2022-04-29 LAB — CUP PACEART INCLINIC DEVICE CHECK
Battery Remaining Longevity: 146 mo
Battery Voltage: 3.21 V
Brady Statistic AP VP Percent: 1.42 %
Brady Statistic AP VS Percent: 0 %
Brady Statistic AS VP Percent: 98.32 %
Brady Statistic AS VS Percent: 0.25 %
Brady Statistic RA Percent Paced: 1.62 %
Brady Statistic RV Percent Paced: 99.75 %
Date Time Interrogation Session: 20230531101500
Implantable Lead Implant Date: 20230518
Implantable Lead Implant Date: 20230518
Implantable Lead Location: 753859
Implantable Lead Location: 753860
Implantable Lead Model: 3830
Implantable Lead Model: 5076
Implantable Pulse Generator Implant Date: 20230518
Lead Channel Impedance Value: 380 Ohm
Lead Channel Impedance Value: 418 Ohm
Lead Channel Impedance Value: 456 Ohm
Lead Channel Impedance Value: 665 Ohm
Lead Channel Pacing Threshold Amplitude: 0.5 V
Lead Channel Pacing Threshold Amplitude: 0.875 V
Lead Channel Pacing Threshold Pulse Width: 0.4 ms
Lead Channel Pacing Threshold Pulse Width: 0.4 ms
Lead Channel Sensing Intrinsic Amplitude: 2.25 mV
Lead Channel Sensing Intrinsic Amplitude: 2.5 mV
Lead Channel Sensing Intrinsic Amplitude: 27 mV
Lead Channel Setting Pacing Amplitude: 3.5 V
Lead Channel Setting Pacing Amplitude: 3.5 V
Lead Channel Setting Pacing Pulse Width: 0.4 ms
Lead Channel Setting Sensing Sensitivity: 1.2 mV

## 2022-04-29 NOTE — Progress Notes (Signed)
Wound check appointment. Steri-strips removed. Wound without redness. Incision edges approximated. Hematoma noted and firm on palpation. Verbal order obtained from Dr. Curt Bears, hold plavix x1 week, have patient come back in for wound recheck in 1 week. Per daughter, patient has not been taking plavix. Wound care education completed to patient and family. Patient made aware to call if swelling increases, drainage, bleeding or redness is present. Normal device function. Thresholds, sensing, and impedances consistent with implant measurements. Device programmed at 3.5V/auto capture programmed on for extra safety margin until 3 month visit. Histogram distribution appropriate for patient and level of activity. No mode switches or high ventricular rates noted. Patient educated about wound care, arm mobility, lifting restrictions. ROV in 1 week for wound recheck.  Patient gave verbal consent to attach photo to medical record.

## 2022-04-29 NOTE — Patient Instructions (Signed)
Do NOT take your plavix for 1 week. We will discuss at your next wound check.  After Your Pacemaker   Monitor your pacemaker site for redness, swelling, and drainage. Call the device clinic at [phone] if you experience these symptoms or fever/chills.  Your incision was closed with Steri-strips or staples:  You may shower 7 days after your procedure and wash your incision with soap and water. Avoid lotions, ointments, or perfumes over your incision until it is well-healed.  You may use a hot tub or a pool after your wound check appointment if the incision is completely closed.  Do not lift, push or pull greater than 10 pounds with the affected arm until 6 weeks after your procedure. There are no other restrictions in arm movement after your wound check appointment.  You may drive, unless driving has been restricted by your healthcare providers.  Remote monitoring is used to monitor your pacemaker from home. This monitoring is scheduled every 91 days by our office. It allows [place] to keep an eye on the functioning of your device to ensure it is working properly. You will routinely see your Electrophysiologist annually (more often if necessary).

## 2022-05-07 ENCOUNTER — Ambulatory Visit (INDEPENDENT_AMBULATORY_CARE_PROVIDER_SITE_OTHER): Payer: Medicare HMO

## 2022-05-07 DIAGNOSIS — I442 Atrioventricular block, complete: Secondary | ICD-10-CM

## 2022-05-07 NOTE — Progress Notes (Signed)
Wound re-check healing stages of bruising noted. Hematoma site smaller, site assessed by Dr. Caryl Comes instructions given to patient to resume plavix

## 2022-05-07 NOTE — Patient Instructions (Signed)
Resume Plavix at normal dosage and time.   Call [phone] if pacemaker site becomes increases in size or new bruising is noted, fever or chills

## 2022-05-28 DIAGNOSIS — M170 Bilateral primary osteoarthritis of knee: Secondary | ICD-10-CM | POA: Diagnosis not present

## 2022-05-28 DIAGNOSIS — M25561 Pain in right knee: Secondary | ICD-10-CM | POA: Diagnosis not present

## 2022-05-28 DIAGNOSIS — Z96653 Presence of artificial knee joint, bilateral: Secondary | ICD-10-CM | POA: Diagnosis not present

## 2022-05-28 DIAGNOSIS — M25562 Pain in left knee: Secondary | ICD-10-CM | POA: Diagnosis not present

## 2022-06-16 DIAGNOSIS — G4733 Obstructive sleep apnea (adult) (pediatric): Secondary | ICD-10-CM | POA: Diagnosis not present

## 2022-06-16 DIAGNOSIS — I10 Essential (primary) hypertension: Secondary | ICD-10-CM | POA: Diagnosis not present

## 2022-06-16 DIAGNOSIS — Z8719 Personal history of other diseases of the digestive system: Secondary | ICD-10-CM | POA: Diagnosis not present

## 2022-06-26 DIAGNOSIS — J019 Acute sinusitis, unspecified: Secondary | ICD-10-CM | POA: Diagnosis not present

## 2022-07-08 ENCOUNTER — Encounter: Payer: Self-pay | Admitting: Adult Health

## 2022-07-08 ENCOUNTER — Ambulatory Visit: Payer: Medicare HMO | Admitting: Adult Health

## 2022-07-08 NOTE — Progress Notes (Deleted)
[facility] [address] [place]. [place] [address] [phone]       STROKE FOLLOW UP NOTE  [name] Date of Birth:  1946/11/06 Medical Record Number:  [id number]   Reason for Referral: stroke follow up    SUBJECTIVE:   CHIEF COMPLAINT:  No chief complaint on file.   HPI:   Update 07/08/2022 JM: Patient returns for 73-monthfollow-up.  At prior visit, complained of acute onset confusion 2 weeks prior although some lingering confusion persisted.  Plan on completing MRI and EEG prior to completion presented to ED 5/9 with recurrent episode of aphasia and increased confusion.  MRI brain negative.  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  EEG unremarkable.  Initiated Keppra 500 mg twice daily for seizure prevention.  Also noted to have UTI and treated with Keflex.  She was discharged after 7 hours.  She returned on 5/10 with increased confusion found to be febrile and hypotensive as well as bradycardic with evidence of acute diverticulitis and met criteria for sepsis secondary to acute diverticulitis and placed on antibiotics.  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  Discharged home on 5/20.   Since that time, she has been stable from neurological standpoint.  She has not had any additional episodes of aphasia or confusion.  Also denies any new stroke/TIA symptoms.  She has remained on Keppra 500 mg twice daily, denies side effects.  Compliant on Plavix and atorvastatin, denies side effects.  Blood pressure today ***.       History provided for reference purposes only Update 03/31/2022 JM: Patient returns for 654-monthtroke follow-up accompanied by her sister.  Reports onset of confusion over the past 2 weeks. Pt reports this only lasted for a few minutes but sister is concerned that there is still some lingering confusion present.  Does complain of increased fatigue over past 2 weeks. Was seen by PCP 4/26 - lab work largely unremarkable, memory test completed but unable to view via epic, MRI ordered which is scheduled on 5/16. Denies any other symptoms such as changes in speech/language, denies weakness, numbness, balance changes or vision changes. Does have chronic imbalance but she believes this is from chronic knee pain. Denies any changes to medications in the past 2 weeks, denies any recent illness, no witnessed seizure activity, denies any falls or trauma, denies any significant increased stressors, denies alcohol or recreational drug use.   Compliant on Plavix and atorvastatin, denies side effects.  Blood pressure today 147/88.  Routinely monitors at home and typically stable.  Cardiac monitor negative for A-fib.  Had loop recorder placed 4/6 with Dr. CaCurt Bears  No further concerns at this time.  Initial visit 10/22/2021 JM: Patient being seen for initial hospital follow-up unaccompanied.   Reports continued imbalance - some prior imbalance since prior stroke but slightly worsened after recent event. Working with PT which has been helping but has only been there 3 times so far. Ambulates without assistive device.  Denies any recent falls.  Denies new stroke/TIA symptoms.  Completed 3 weeks DAPT - remains on plavix and atorvastatin without side effects  Blood pressure today 134/82. Occasionally monitor at home.  Currently working cardiac monitor - will be completed Friday and has f/u with cardiology to review 12/2  No further concerns at this time  Stroke admission 09/11/2021 [name] a [age].o. female  with PMH significant for hypertension, GERD, hyperlipidemia who presented on 09/11/2021 with sudden onset aphasia and right sided visual disturbance.  Personally reviewed hospitalization pertinent progress notes, lab work and imaging.  Evaluated by Dr. Leonie Man for likely embolic TIA with resolution of symptoms s/p  tNK administration.  MRI no acute infarct.  LTM EEG no seizure.  Recommended 30-day cardiac event monitor to assess for A. fib and if negative to consider loop recorder placement.  LDL 45.  A1c 5.3.  Recommended DAPT for 3 weeks then Plavix alone and continuation of home dose atorvastatin.  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  PT/OT recommended OP OT      PERTINENT IMAGING/LABS    MRI BRAIN W WO CONTRAST 04/08/2022 IMPRESSION: 1. No acute finding.  No focal cortical abnormality. 2. Extensive chronic small vessel ischemia. 3. Motion artifact.  rEEG 04/08/2022 IMPRESSION: This study is within normal limits. No seizures or epileptiform discharges were seen throughout the recording.  rEEG 09/12/2021 IMPRESSION: This study is suggestive of cortical dysfunction in left hemisphere, maximal left temporal region nonspecific etiology but could be secondary to underlying structural abnormality, postictal state.  No seizures or epileptiform discharges were seen throughout the recording.  cEEG 09/13/2021 IMPRESSION: This study is within normal limits. No seizures or epileptiform discharges were seen throughout the recording.   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   DJD (degenerative joint disease)    knees, toe, right hip   Fibroadenoma of breast 2011   Right   Fractured shoulder 2018   right   GERD (gastroesophageal reflux disease)    H/O colonoscopy    tubular adenoma   History of blood transfusion 52 yrs ago   Hypertension    IBS  (irritable bowel syndrome)    Impaired fasting glucose    Left wrist fracture 06/2009   Lower GI bleed 2010   Osteopenia 2007   DEXA scan 1.7 hip   Plantar fasciitis of right foot    Positive colorectal cancer screening using Cologuard test 11/2018   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   Postcholecystectomy diarrhea    Reflux    Seasonal allergic rhinitis    Shingles 2017   R V1   Stroke ([place]) 10/2015    PSH:  Past Surgical History:  Procedure Laterality Date   ABDOMINAL HYSTERECTOMY  1984   TAH, partial   BREAST BIOPSY Right 2011   CATARACT EXTRACTION, BILATERAL Bilateral 2021   CHOLECYSTECTOMY  2009   ESOPHAGOGASTRODUODENOSCOPY  11/14/2012   Procedure: ESOPHAGOGASTRODUODENOSCOPY (EGD);  Surgeon: Wonda Horner, MD;  Location: [facility] ENDOSCOPY;  Service: Endoscopy;  Laterality: N/A;   ESOPHAGOGASTRODUODENOSCOPY (EGD) WITH PROPOFOL N/A 09/21/2016   Procedure: ESOPHAGOGASTRODUODENOSCOPY (EGD) WITH PROPOFOL;  Surgeon: Garlan Fair, MD;  Location: [facility] ENDOSCOPY;  Service: Endoscopy;  Laterality: N/A;   ESOPHAGOGASTRODUODENOSCOPY (EGD) WITH PROPOFOL N/A 12/17/2020   Procedure: ESOPHAGOGASTRODUODENOSCOPY (EGD) WITH PROPOFOL;  Surgeon: Ronald Lobo, MD;  Location: [facility] ENDOSCOPY;  Service: Endoscopy;  Laterality: N/A;  Herniated disc  1987   lower back    KNEE SURGERY  left 2010, right 2013   Arthroscopic   LOOP RECORDER REMOVAL N/A 04/16/2022   Procedure: LOOP RECORDER REMOVAL;  Surgeon: Constance Haw, MD;  Location: [place] [facility] CV LAB;  Service: Cardiovascular;  Laterality: N/A;   LUMBAR LAMINECTOMY Left    PACEMAKER IMPLANT N/A 04/16/2022   Procedure: PACEMAKER IMPLANT;  Surgeon: Constance Haw, MD;  Location: [place] [facility] LAB;  Service: Cardiovascular;  Laterality: N/A;   PARTIAL KNEE ARTHROPLASTY  06/14/2012   Procedure: UNICOMPARTMENTAL KNEE;  Surgeon: Mauri Pole, MD;  Location: [facility] ORS;  Service: Orthopedics;  Laterality: Left;  Left Medial Unicompartmental Knee    PARTIAL KNEE ARTHROPLASTY Right 11/27/2013   Procedure: UNICOMPARTMENTAL RIGHT KNEE, Steroid injection in right great toe;  Surgeon: Mauri Pole, MD;  Location: [facility] ORS;  Service: Orthopedics;  Laterality: Right;   TEMPORARY PACEMAKER N/A 04/10/2022   Procedure: TEMPORARY PACEMAKER;  Surgeon: Jolaine Artist, MD;  Location: [place] [facility];  Service: Cardiovascular;  Laterality: N/A;    Social History:  Social History   Socioeconomic History   Marital status: Married    Spouse name: Not on file   Number of children: 3   Years of education: Not on file   Highest education level: Not on file  Occupational History   Occupation: Retired  Tobacco Use   Smoking status: Never   Smokeless tobacco: Never  Vaping Use   Vaping Use: Never used  Substance and Sexual Activity   Alcohol use: No   Drug use: No   Sexual activity: Yes    Birth control/protection: Surgical  Other Topics Concern   Not on file  Social History Narrative   Not on file   Social Determinants of Health   Financial Resource Strain: Not on file  Food Insecurity: No Food Insecurity (10/31/2021)   Hunger Vital Sign    Worried About Running Out of Food in the Last Year: Never true    Ran Out of Food in the Last Year: Never true  Transportation Needs: No Transportation Needs (10/31/2021)   PRAPARE - Hydrologist (Medical): No    Lack of Transportation (Non-Medical): No  Physical Activity: Not on file  Stress: Not on file  Social Connections: Not on file  Intimate Partner Violence: Not on file    Family History:  Family History  Problem Relation Age of Onset   Hypertension Mother    Other Mother        Brain tumor   Osteoporosis Mother    Asthma Mother    Coronary artery disease Mother    Hypertension Father    Diabetes Father    Cancer - Lung Father    Colon polyps Father    Anemia Father    Osteoporosis Sister    Colon polyps Sister    Glaucoma Sister     Hyperthyroidism Sister    Rheum arthritis Sister    Breast cancer Paternal Aunt        Age 34's   Breast cancer Paternal Grandmother        Age 50    Medications:   Current Outpatient Medications on File Prior to Visit  Medication Sig Dispense Refill   ALPRAZolam (XANAX) 0.25 MG tablet Take 0.25 mg by mouth at bedtime as needed for anxiety or sleep.     amLODipine (NORVASC) 5 MG tablet Take 1 tablet (5 mg total)  by mouth daily. 30 tablet 0   atorvastatin (LIPITOR) 20 MG tablet Take 1 tablet (20 mg total) by mouth daily at 6 PM. (Patient taking differently: Take 20 mg by mouth every evening.) 30 tablet 2   cholecalciferol (VITAMIN D) 1000 UNITS tablet Take 1,000 Units by mouth every evening.     clopidogrel (PLAVIX) 75 MG tablet Take 1 tablet (75 mg total) by mouth daily. 30 tablet 0   diphenhydrAMINE (BENADRYL) 25 mg capsule Take 25 mg by mouth every 6 (six) hours as needed for allergies.     escitalopram (LEXAPRO) 20 MG tablet Take 20 mg by mouth daily.     famotidine (PEPCID) 20 MG tablet Take 20 mg by mouth daily as needed for heartburn or indigestion.     fluticasone (FLONASE) 50 MCG/ACT nasal spray Place 1 spray into both nostrils daily as needed for allergies or rhinitis.     KLOR-CON M20 20 MEQ tablet Take 20 mEq by mouth every evening.     levETIRAcetam (KEPPRA) 500 MG tablet Take 1 tablet (500 mg total) by mouth 2 (two) times daily. 60 tablet 0   Lidocaine HCl (ASPERCREME LIDOCAINE) 4 % LIQD Apply 1 application. topically daily as needed (pain).     loperamide (IMODIUM A-D) 2 MG tablet Take 2 mg by mouth 4 (four) times daily as needed for diarrhea or loose stools.     Multiple Vitamin (MULTIVITAMIN) tablet Take 1 tablet by mouth every evening.     omeprazole (PRILOSEC) 20 MG capsule Take 20 mg by mouth daily.     valsartan-hydrochlorothiazide (DIOVAN-HCT) 160-25 MG tablet Take 1 tablet by mouth every evening.     vitamin B-12 (CYANOCOBALAMIN) 100 MCG tablet Take 100 mcg by mouth  every evening.     No current facility-administered medications on file prior to visit.    Allergies:   Allergies  Allergen Reactions   Nexium [Esomeprazole] Diarrhea   Codeine Nausea And Vomiting   Lisinopril Cough   Lorazepam     Other reaction(s): severe agitation with delirium    Losartan Potassium-Hctz Other (See Comments)    Other reaction(s): dizzy   Omeprazole Diarrhea    Other reaction(s): diarrhea   Paroxetine Nausea Only   Sertraline Hcl Nausea Only   Zofran [Ondansetron]       OBJECTIVE:  Physical Exam  There were no vitals filed for this visit.   There is no height or weight on file to calculate BMI. No results found.   General: well developed, well nourished, very pleasant elderly Caucasian female, seated, in no evident distress Head: head normocephalic and atraumatic.   Neck: supple with no carotid or supraclavicular bruits Cardiovascular: regular rate and rhythm, no murmurs Musculoskeletal: no deformity Skin:  no rash/petichiae Vascular:  Normal pulses all extremities   Neurologic Exam Mental Status: Awake and fully alert. Fluent speech and language.  Oriented to place and time. Recent and remote memory impaired.  Attention span, concentration and fund of knowledge impaired.  Disorganized thoughts and flight of ideas.  Mood and affect flat. Memory testing recently completed by PCP Cranial Nerves: Pupils equal, briskly reactive to light. Extraocular movements full without nystagmus. Visual fields full to confrontation. Hearing intact. Facial sensation intact. Face, tongue, palate moves normally and symmetrically.  Motor: Normal bulk and tone. Normal strength in all tested extremity muscles Sensory.: intact to touch , pinprick , position and vibratory sensation.  Coordination: Rapid alternating movements normal in all extremities. Finger-to-nose and heel-to-shin performed accurately bilaterally. Gait and  Station: Arises from chair without difficulty.  Stance is normal. Gait demonstrates normal stride length and mild imbalance/unsteadiness greater with turns without AD.  Difficulty performing tandem walk and heel toe.  Reflexes: 1+ and symmetric. Toes downgoing.         ASSESSMENT: Jordan Lane is a 76 y.o. 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 Of note, ED admission 5/10 for sepsis secondary to acute diverticulitis and evidence of complete heart block s/p pacer and removal of ILR.     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 Continue Keppra 500 mg twice daily rEEG 03/2022 unremarkable rEEG 08/2021 focal left hemispheric slowing, no epileptiform discharges cEEG 08/2021 no evidence of slowing or epileptiform discharges MRI brain 03/2022 negative for acute infarct MRI brain 08/2021 negative for acute infarct Previously felt to be in setting of TIA vs aborted stroke s/p TNK Recommend continuation of Plavix and atorvastatin 20 mg daily for stroke prevention Advised continue close PCP follow-up for aggressive stroke risk factor management including BP goal<130/90, and HLD with LDL goal<70       Follow up in 3 months or call earlier if needed    CC:  PCP: Lavone Orn, MD    I spent 38 minutes of face-to-face and non-face-to-face time with patient and sister.  This included previsit chart review, lab review, study review, electronic health record documentation, patient and sister education regarding new onset confusion, possible etiologies and further evaluation, history of stroke, secondary stroke prevention measures and importance of managing stroke risk  factors, and answered all other questions to patient and sisters satisfaction   Frann Rider, AGNP-BC  [facility] [address] [place], [place] [address]  Phone [phone] Fax [phone] Note: This document was prepared with digital dictation and possible smart phrase technology. Any transcriptional errors that result from this process are unintentional.

## 2022-07-20 ENCOUNTER — Ambulatory Visit (INDEPENDENT_AMBULATORY_CARE_PROVIDER_SITE_OTHER): Payer: Medicare HMO

## 2022-07-20 DIAGNOSIS — I442 Atrioventricular block, complete: Secondary | ICD-10-CM

## 2022-07-20 DIAGNOSIS — G4733 Obstructive sleep apnea (adult) (pediatric): Secondary | ICD-10-CM | POA: Diagnosis not present

## 2022-07-21 LAB — CUP PACEART REMOTE DEVICE CHECK
Battery Remaining Longevity: 136 mo
Battery Voltage: 3.18 V
Brady Statistic AP VP Percent: 33.17 %
Brady Statistic AP VS Percent: 0.12 %
Brady Statistic AS VP Percent: 65.46 %
Brady Statistic AS VS Percent: 1.25 %
Brady Statistic RA Percent Paced: 33.36 %
Brady Statistic RV Percent Paced: 98.63 %
Date Time Interrogation Session: 20230820220036
Implantable Lead Implant Date: 20230518
Implantable Lead Implant Date: 20230518
Implantable Lead Location: 753859
Implantable Lead Location: 753860
Implantable Lead Model: 3830
Implantable Lead Model: 5076
Implantable Pulse Generator Implant Date: 20230518
Lead Channel Impedance Value: 361 Ohm
Lead Channel Impedance Value: 399 Ohm
Lead Channel Impedance Value: 418 Ohm
Lead Channel Impedance Value: 570 Ohm
Lead Channel Pacing Threshold Amplitude: 0.5 V
Lead Channel Pacing Threshold Amplitude: 0.625 V
Lead Channel Pacing Threshold Pulse Width: 0.4 ms
Lead Channel Pacing Threshold Pulse Width: 0.4 ms
Lead Channel Sensing Intrinsic Amplitude: 2.25 mV
Lead Channel Sensing Intrinsic Amplitude: 2.25 mV
Lead Channel Sensing Intrinsic Amplitude: 26.5 mV
Lead Channel Sensing Intrinsic Amplitude: 26.5 mV
Lead Channel Setting Pacing Amplitude: 2 V
Lead Channel Setting Pacing Amplitude: 3.5 V
Lead Channel Setting Pacing Pulse Width: 0.4 ms
Lead Channel Setting Sensing Sensitivity: 1.2 mV

## 2022-07-24 ENCOUNTER — Encounter: Payer: Self-pay | Admitting: Cardiology

## 2022-07-24 ENCOUNTER — Ambulatory Visit: Payer: Medicare HMO | Admitting: Cardiology

## 2022-07-24 VITALS — BP 120/72 | HR 68 | Ht 62.0 in | Wt 145.8 lb

## 2022-07-24 DIAGNOSIS — I442 Atrioventricular block, complete: Secondary | ICD-10-CM

## 2022-07-24 NOTE — Progress Notes (Signed)
Electrophysiology Office Note   Date:  07/24/2022   ID:  Jordan Lane, DOB 21-Aug-1946, MRN [id number]  PCP:  Lavone Orn, MD  Cardiologist:  Johnsie Cancel Primary Electrophysiologist:  Kiwana Deblasi Meredith Leeds, MD    Chief Complaint: CVA   History of Present Illness: Jordan Lane is a 76 y.o. female who is being seen today for the evaluation of CVA at the request of Lavone Orn, MD. Presenting today for electrophysiology evaluation.  She has a history significant for hypertension.  She presented to the hospital 09/11/2021 with sudden aphasia and right-sided anopia.  She had TNKase administered with resolution of her findings.  She is post Linq monitor implant.  Linq monitor showed episodes of complete heart block.  Linq monitor was explanted and that she is post Medtronic pacemaker implanted 04/16/2022.  Today, denies symptoms of palpitations, chest pain, shortness of breath, orthopnea, PND, lower extremity edema, claudication, dizziness, presyncope, syncope, bleeding, or neurologic sequela. The patient is tolerating medications without difficulties.  Since being seen she has done well.  She is recovered from her hospital delirium.  She has no major complaints.  She has no chest pain or shortness of breath.  She is able to do all her daily activities.    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   DJD (degenerative joint disease)    knees, toe, right hip   Fibroadenoma of breast 2011   Right   Fractured shoulder 2018   right   GERD (gastroesophageal reflux disease)    H/O colonoscopy    tubular adenoma   History of blood transfusion 52 yrs ago   Hypertension    IBS (irritable bowel syndrome)    Impaired fasting glucose    Left wrist fracture 06/2009   Lower GI bleed 2010   Osteopenia 2007   DEXA scan 1.7 hip   Plantar fasciitis of right foot    Positive colorectal cancer screening using  Cologuard test 11/2018   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   Postcholecystectomy diarrhea    Reflux    Seasonal allergic rhinitis    Shingles 2017   R V1   Stroke ([place]) 10/2015   Past Surgical History:  Procedure Laterality Date   ABDOMINAL HYSTERECTOMY  1984   TAH, partial   BREAST BIOPSY Right 2011   CATARACT EXTRACTION, BILATERAL Bilateral 2021   CHOLECYSTECTOMY  2009   ESOPHAGOGASTRODUODENOSCOPY  11/14/2012   Procedure: ESOPHAGOGASTRODUODENOSCOPY (EGD);  Surgeon: Wonda Horner, MD;  Location: [facility] ENDOSCOPY;  Service: Endoscopy;  Laterality: N/A;   ESOPHAGOGASTRODUODENOSCOPY (EGD) WITH PROPOFOL N/A 09/21/2016   Procedure: ESOPHAGOGASTRODUODENOSCOPY (EGD) WITH PROPOFOL;  Surgeon: Garlan Fair, MD;  Location: [facility] ENDOSCOPY;  Service: Endoscopy;  Laterality: N/A;   ESOPHAGOGASTRODUODENOSCOPY (EGD) WITH PROPOFOL N/A 12/17/2020   Procedure: ESOPHAGOGASTRODUODENOSCOPY (EGD) WITH PROPOFOL;  Surgeon: Ronald Lobo, MD;  Location: [facility] ENDOSCOPY;  Service: Endoscopy;  Laterality: N/A;   Herniated disc  1987   lower back    KNEE SURGERY  left 2010, right 2013   Arthroscopic   LOOP RECORDER REMOVAL N/A 04/16/2022   Procedure: LOOP RECORDER REMOVAL;  Surgeon: Constance Haw, MD;  Location: [place] CV LAB;  Service: Cardiovascular;  Laterality: N/A;   LUMBAR LAMINECTOMY Left    PACEMAKER IMPLANT N/A 04/16/2022   Procedure: PACEMAKER IMPLANT;  Surgeon: Constance Haw, MD;  Location: [place] CV LAB;  Service: Cardiovascular;  Laterality: N/A;   PARTIAL KNEE ARTHROPLASTY  06/14/2012   Procedure: UNICOMPARTMENTAL KNEE;  Surgeon: Mauri Pole, MD;  Location: [facility] ORS;  Service: Orthopedics;  Laterality: Left;  Left Medial Unicompartmental Knee   PARTIAL KNEE ARTHROPLASTY Right 11/27/2013   Procedure: UNICOMPARTMENTAL RIGHT KNEE, Steroid injection in right great toe;  Surgeon: Mauri Pole, MD;  Location: [facility] OR[facility];  Service: Orthopedics;  Laterality: Right;   TEMPORARY PACEMAKER  N/A 04/10/2022   Procedure: TEMPORARY PACEMAKER;  Surgeon: Jolaine Artist, MD;  Location: [place] [facility] LAB;  Service: Cardiovascular;  Laterality: N/A;     Current Outpatient Medications  Medication Sig Dispense Refill   ALPRAZolam (XANAX) 0.25 MG tablet Take 0.25 mg by mouth at bedtime as needed for anxiety or sleep.     atorvastatin (LIPITOR) 20 MG tablet Take 1 tablet (20 mg total) by mouth daily at 6 PM. 30 tablet 2   cholecalciferol (VITAMIN D) 1000 UNITS tablet Take 1,000 Units by mouth every evening.     clopidogrel (PLAVIX) 75 MG tablet Take 1 tablet (75 mg total) by mouth daily. 30 tablet 0   diphenhydrAMINE (BENADRYL) 25 mg capsule Take 25 mg by mouth every 6 (six) hours as needed for allergies.     escitalopram (LEXAPRO) 20 MG tablet Take 20 mg by mouth daily.     fluticasone (FLONASE) 50 MCG/ACT nasal spray Place 1 spray into both nostrils daily as needed for allergies or rhinitis.     KLOR-CON M20 20 MEQ tablet Take 20 mEq by mouth every evening.     Lidocaine HCl (ASPERCREME LIDOCAINE) 4 % LIQD Apply 1 application. topically daily as needed (pain).     loperamide (IMODIUM A-D) 2 MG tablet Take 2 mg by mouth 4 (four) times daily as needed for diarrhea or loose stools.     Multiple Vitamin (MULTIVITAMIN) tablet Take 1 tablet by mouth every evening.     omeprazole (PRILOSEC) 20 MG capsule Take 20 mg by mouth every other day.     valsartan-hydrochlorothiazide (DIOVAN-HCT) 160-25 MG tablet Take 1 tablet by mouth every evening.     vitamin B-12 (CYANOCOBALAMIN) 100 MCG tablet Take 100 mcg by mouth every evening.     amLODipine (NORVASC) 5 MG tablet Take 1 tablet (5 mg total) by mouth daily. 30 tablet 0   famotidine (PEPCID) 20 MG tablet Take 20 mg by mouth daily as needed for heartburn or indigestion. (Patient not taking: Reported on 07/24/2022)     levETIRAcetam (KEPPRA) 500 MG tablet Take 1 tablet (500 mg total) by mouth 2 (two) times daily. (Patient not taking: Reported on  07/24/2022) 60 tablet 0   No current facility-administered medications for this visit.    Allergies:   Nexium [esomeprazole], Codeine, Lisinopril, Lorazepam, Losartan potassium-hctz, Omeprazole, Paroxetine, Sertraline hcl, and Zofran [ondansetron]   Social History:  The patient  reports that she has never smoked. She has never used smokeless tobacco. She reports that she does not drink alcohol and does not use drugs.   Family History:  The patient's family history includes Anemia in her father; Asthma in her mother; Breast cancer in her paternal aunt and paternal grandmother; Cancer - Lung in her father; Colon polyps in her father and sister; Coronary artery disease in her mother; Diabetes in her father; Glaucoma in her sister; Hypertension in her father and mother; Hyperthyroidism in her sister; Osteoporosis in her mother and sister; Other in her mother; Rheum arthritis in her sister.   ROS:  Please see the history of present illness.   Otherwise, review of systems is positive for none.   All other systems are reviewed and negative.   PHYSICAL EXAM: VS:  BP 120/72   Pulse 68   Ht '5\' 2"'$  (1.575 m)   Wt 145 lb 12.8 oz (66.1 kg)   BMI 26.67 kg/m  , BMI Body mass index is 26.67 kg/m. GEN: Well nourished, well developed, in no acute distress  HEENT: normal  Neck: no JVD, carotid bruits, or masses Cardiac: RRR; no murmurs, rubs, or gallops,no edema  Respiratory:  clear to auscultation bilaterally, normal work of breathing GI: soft, nontender, nondistended, + BS MS: no deformity or atrophy  Skin: warm and dry, device site well healed Neuro:  Strength and sensation are intact Psych: euthymic mood, full affect  EKG:  EKG is ordered today. Personal review of the ekg ordered shows sinus rhythm, ventricular paced, rate 68  Personal review of the device interrogation today. Results in [place]: 04/11/2022: TSH 1.974 04/15/2022: ALT 19 04/18/2022: BUN <5; Creatinine, Ser 0.74;  Hemoglobin 11.8; Magnesium 1.8; Platelets 284; Potassium 3.5; Sodium 142    Lipid Panel     Component Value Date/Time   CHOL 125 09/12/2021 0403   TRIG 160 (H) 09/12/2021 0403   HDL 48 09/12/2021 0403   CHOLHDL 2.6 09/12/2021 0403   VLDL 32 09/12/2021 0403   LDLCALC 45 09/12/2021 0403     Wt Readings from Last 3 Encounters:  07/24/22 145 lb 12.8 oz (66.1 kg)  04/18/22 150 lb 12.8 oz (68.4 kg)  04/07/22 150 lb (68 kg)      Other studies Reviewed: Additional studies/ records that were reviewed today include: TTE 09/12/21  Review of the above records today demonstrates:   1. Left ventricular ejection fraction, by estimation, is 60 to 65%. The  left ventricle has normal function. Left ventricular endocardial border  not optimally defined to evaluate regional wall motion. Left ventricular  diastolic parameters are  indeterminate.   2. Right ventricular systolic function is normal. The right ventricular  size is normal. Tricuspid regurgitation signal is inadequate for assessing  PA pressure.   3. The mitral valve is normal in structure. No evidence of mitral valve  regurgitation. No evidence of mitral stenosis.   4. The aortic valve is grossly normal. Aortic valve regurgitation is not  visualized. No aortic stenosis is present.   5. The inferior vena cava is normal in size with greater than 50%  respiratory variability, suggesting right atrial pressure of 3 mmHg.   Monitor 10/28/21 personally reviewed NSR One run atrial tachycardia vs flutter No afib PAC;s    ASSESSMENT AND PLAN:  1..  TIA: Post TNKase with negative MRI.  Is status post Linq monitor implant.  Linq has been explanted due to complete heart block.  No atrial fibrillation noted.  2.  Complete heart block: Status post Medtronic dual-chamber pacemaker implanted 04/16/2022.  Device functioning appropriately.  No changes.  3.  Hypertension: Currently well controlled  4.  Hyperlipidemia: Continue statin per  neurology.   Current medicines are reviewed at length with the patient today.   The patient does not have concerns regarding her medicines.  The following changes were made today: None  Labs/ tests ordered today include:  Orders Placed This Encounter  Procedures   EKG 12-Lead     Disposition:   FU 9 months  Signed, Areil Ottey Meredith Leeds, MD  07/24/2022 4:06 PM  [place]  [place] [address] [phone] (office) [phone] (fax)

## 2022-07-27 DIAGNOSIS — Z961 Presence of intraocular lens: Secondary | ICD-10-CM | POA: Diagnosis not present

## 2022-07-27 DIAGNOSIS — H52203 Unspecified astigmatism, bilateral: Secondary | ICD-10-CM | POA: Diagnosis not present

## 2022-07-27 DIAGNOSIS — H01001 Unspecified blepharitis right upper eyelid: Secondary | ICD-10-CM | POA: Diagnosis not present

## 2022-07-27 DIAGNOSIS — H18513 Endothelial corneal dystrophy, bilateral: Secondary | ICD-10-CM | POA: Diagnosis not present

## 2022-07-27 DIAGNOSIS — H01004 Unspecified blepharitis left upper eyelid: Secondary | ICD-10-CM | POA: Diagnosis not present

## 2022-07-27 DIAGNOSIS — H04123 Dry eye syndrome of bilateral lacrimal glands: Secondary | ICD-10-CM | POA: Diagnosis not present

## 2022-07-28 DIAGNOSIS — K219 Gastro-esophageal reflux disease without esophagitis: Secondary | ICD-10-CM | POA: Diagnosis not present

## 2022-07-28 DIAGNOSIS — I10 Essential (primary) hypertension: Secondary | ICD-10-CM | POA: Diagnosis not present

## 2022-07-28 DIAGNOSIS — M8580 Other specified disorders of bone density and structure, unspecified site: Secondary | ICD-10-CM | POA: Diagnosis not present

## 2022-07-28 DIAGNOSIS — E781 Pure hyperglyceridemia: Secondary | ICD-10-CM | POA: Diagnosis not present

## 2022-07-28 LAB — CUP PACEART INCLINIC DEVICE CHECK
Battery Remaining Longevity: 150 mo
Battery Voltage: 3.18 V
Brady Statistic AP VP Percent: 33.93 %
Brady Statistic AP VS Percent: 0.11 %
Brady Statistic AS VP Percent: 64.77 %
Brady Statistic AS VS Percent: 1.18 %
Brady Statistic RA Percent Paced: 34.11 %
Brady Statistic RV Percent Paced: 98.7 %
Date Time Interrogation Session: 20230825155500
Implantable Lead Implant Date: 20230518
Implantable Lead Implant Date: 20230518
Implantable Lead Location: 753859
Implantable Lead Location: 753860
Implantable Lead Model: 3830
Implantable Lead Model: 5076
Implantable Pulse Generator Implant Date: 20230518
Lead Channel Impedance Value: 361 Ohm
Lead Channel Impedance Value: 399 Ohm
Lead Channel Impedance Value: 418 Ohm
Lead Channel Impedance Value: 608 Ohm
Lead Channel Pacing Threshold Amplitude: 0.5 V
Lead Channel Pacing Threshold Amplitude: 0.75 V
Lead Channel Pacing Threshold Pulse Width: 0.4 ms
Lead Channel Pacing Threshold Pulse Width: 0.4 ms
Lead Channel Sensing Intrinsic Amplitude: 2.375 mV
Lead Channel Sensing Intrinsic Amplitude: 2.875 mV
Lead Channel Sensing Intrinsic Amplitude: 26.5 mV
Lead Channel Sensing Intrinsic Amplitude: 26.5 mV
Lead Channel Setting Pacing Amplitude: 2 V
Lead Channel Setting Pacing Amplitude: 2 V
Lead Channel Setting Pacing Pulse Width: 0.4 ms
Lead Channel Setting Sensing Sensitivity: 1.2 mV

## 2022-08-17 NOTE — Progress Notes (Signed)
Remote pacemaker transmission.   

## 2022-09-15 DIAGNOSIS — Z85828 Personal history of other malignant neoplasm of skin: Secondary | ICD-10-CM | POA: Diagnosis not present

## 2022-09-15 DIAGNOSIS — D2262 Melanocytic nevi of left upper limb, including shoulder: Secondary | ICD-10-CM | POA: Diagnosis not present

## 2022-09-15 DIAGNOSIS — L821 Other seborrheic keratosis: Secondary | ICD-10-CM | POA: Diagnosis not present

## 2022-09-15 DIAGNOSIS — D692 Other nonthrombocytopenic purpura: Secondary | ICD-10-CM | POA: Diagnosis not present

## 2022-09-15 DIAGNOSIS — D2271 Melanocytic nevi of right lower limb, including hip: Secondary | ICD-10-CM | POA: Diagnosis not present

## 2022-09-15 DIAGNOSIS — L905 Scar conditions and fibrosis of skin: Secondary | ICD-10-CM | POA: Diagnosis not present

## 2022-10-06 DIAGNOSIS — E781 Pure hyperglyceridemia: Secondary | ICD-10-CM | POA: Diagnosis not present

## 2022-10-06 DIAGNOSIS — I10 Essential (primary) hypertension: Secondary | ICD-10-CM | POA: Diagnosis not present

## 2022-10-06 DIAGNOSIS — K219 Gastro-esophageal reflux disease without esophagitis: Secondary | ICD-10-CM | POA: Diagnosis not present

## 2022-10-06 DIAGNOSIS — M8580 Other specified disorders of bone density and structure, unspecified site: Secondary | ICD-10-CM | POA: Diagnosis not present

## 2022-10-19 ENCOUNTER — Ambulatory Visit (INDEPENDENT_AMBULATORY_CARE_PROVIDER_SITE_OTHER): Payer: Medicare HMO

## 2022-10-19 DIAGNOSIS — I442 Atrioventricular block, complete: Secondary | ICD-10-CM | POA: Diagnosis not present

## 2022-10-20 LAB — CUP PACEART REMOTE DEVICE CHECK
Battery Remaining Longevity: 140 mo
Battery Voltage: 3.14 V
Brady Statistic AP VP Percent: 41.69 %
Brady Statistic AP VS Percent: 0 %
Brady Statistic AS VP Percent: 58.19 %
Brady Statistic AS VS Percent: 0.12 %
Brady Statistic RA Percent Paced: 41.75 %
Brady Statistic RV Percent Paced: 99.88 %
Date Time Interrogation Session: 20231119204619
Implantable Lead Connection Status: 753985
Implantable Lead Connection Status: 753985
Implantable Lead Implant Date: 20230518
Implantable Lead Implant Date: 20230518
Implantable Lead Location: 753859
Implantable Lead Location: 753860
Implantable Lead Model: 3830
Implantable Lead Model: 5076
Implantable Pulse Generator Implant Date: 20230518
Lead Channel Impedance Value: 361 Ohm
Lead Channel Impedance Value: 399 Ohm
Lead Channel Impedance Value: 437 Ohm
Lead Channel Impedance Value: 646 Ohm
Lead Channel Pacing Threshold Amplitude: 0.5 V
Lead Channel Pacing Threshold Amplitude: 0.875 V
Lead Channel Pacing Threshold Pulse Width: 0.4 ms
Lead Channel Pacing Threshold Pulse Width: 0.4 ms
Lead Channel Sensing Intrinsic Amplitude: 2.375 mV
Lead Channel Sensing Intrinsic Amplitude: 2.375 mV
Lead Channel Sensing Intrinsic Amplitude: 26.5 mV
Lead Channel Sensing Intrinsic Amplitude: 26.5 mV
Lead Channel Setting Pacing Amplitude: 2 V
Lead Channel Setting Pacing Amplitude: 2.25 V
Lead Channel Setting Pacing Pulse Width: 0.4 ms
Lead Channel Setting Sensing Sensitivity: 1.2 mV
Zone Setting Status: 755011

## 2022-11-03 DIAGNOSIS — G4733 Obstructive sleep apnea (adult) (pediatric): Secondary | ICD-10-CM | POA: Diagnosis not present

## 2022-11-19 DIAGNOSIS — M170 Bilateral primary osteoarthritis of knee: Secondary | ICD-10-CM | POA: Diagnosis not present

## 2022-11-19 DIAGNOSIS — M7062 Trochanteric bursitis, left hip: Secondary | ICD-10-CM | POA: Diagnosis not present

## 2022-11-19 DIAGNOSIS — M1712 Unilateral primary osteoarthritis, left knee: Secondary | ICD-10-CM | POA: Diagnosis not present

## 2022-11-19 DIAGNOSIS — M25552 Pain in left hip: Secondary | ICD-10-CM | POA: Diagnosis not present

## 2022-11-27 NOTE — Progress Notes (Signed)
Remote pacemaker transmission.   

## 2022-12-02 DIAGNOSIS — G4733 Obstructive sleep apnea (adult) (pediatric): Secondary | ICD-10-CM | POA: Diagnosis not present

## 2022-12-03 DIAGNOSIS — F419 Anxiety disorder, unspecified: Secondary | ICD-10-CM | POA: Diagnosis not present

## 2022-12-10 ENCOUNTER — Encounter: Payer: Self-pay | Admitting: Cardiology

## 2023-01-11 DIAGNOSIS — E781 Pure hyperglyceridemia: Secondary | ICD-10-CM | POA: Diagnosis not present

## 2023-01-11 DIAGNOSIS — M8580 Other specified disorders of bone density and structure, unspecified site: Secondary | ICD-10-CM | POA: Diagnosis not present

## 2023-01-11 DIAGNOSIS — K219 Gastro-esophageal reflux disease without esophagitis: Secondary | ICD-10-CM | POA: Diagnosis not present

## 2023-01-11 DIAGNOSIS — I10 Essential (primary) hypertension: Secondary | ICD-10-CM | POA: Diagnosis not present

## 2023-01-13 DIAGNOSIS — G459 Transient cerebral ischemic attack, unspecified: Secondary | ICD-10-CM | POA: Diagnosis not present

## 2023-01-13 DIAGNOSIS — R7301 Impaired fasting glucose: Secondary | ICD-10-CM | POA: Diagnosis not present

## 2023-01-13 DIAGNOSIS — Z0000 Encounter for general adult medical examination without abnormal findings: Secondary | ICD-10-CM | POA: Diagnosis not present

## 2023-01-13 DIAGNOSIS — R739 Hyperglycemia, unspecified: Secondary | ICD-10-CM | POA: Diagnosis not present

## 2023-01-13 DIAGNOSIS — F322 Major depressive disorder, single episode, severe without psychotic features: Secondary | ICD-10-CM | POA: Diagnosis not present

## 2023-01-13 DIAGNOSIS — I10 Essential (primary) hypertension: Secondary | ICD-10-CM | POA: Diagnosis not present

## 2023-01-13 DIAGNOSIS — I700 Atherosclerosis of aorta: Secondary | ICD-10-CM | POA: Diagnosis not present

## 2023-01-13 DIAGNOSIS — M8580 Other specified disorders of bone density and structure, unspecified site: Secondary | ICD-10-CM | POA: Diagnosis not present

## 2023-01-13 DIAGNOSIS — F419 Anxiety disorder, unspecified: Secondary | ICD-10-CM | POA: Diagnosis not present

## 2023-01-13 DIAGNOSIS — G4733 Obstructive sleep apnea (adult) (pediatric): Secondary | ICD-10-CM | POA: Diagnosis not present

## 2023-01-18 ENCOUNTER — Ambulatory Visit (INDEPENDENT_AMBULATORY_CARE_PROVIDER_SITE_OTHER): Payer: Medicare HMO

## 2023-01-18 DIAGNOSIS — Z1231 Encounter for screening mammogram for malignant neoplasm of breast: Secondary | ICD-10-CM | POA: Diagnosis not present

## 2023-01-18 DIAGNOSIS — I442 Atrioventricular block, complete: Secondary | ICD-10-CM | POA: Diagnosis not present

## 2023-01-18 LAB — CUP PACEART REMOTE DEVICE CHECK
Battery Remaining Longevity: 138 mo
Battery Voltage: 3.08 V
Brady Statistic AP VP Percent: 29.78 %
Brady Statistic AP VS Percent: 0 %
Brady Statistic AS VP Percent: 69.96 %
Brady Statistic AS VS Percent: 0.26 %
Brady Statistic RA Percent Paced: 29.87 %
Brady Statistic RV Percent Paced: 99.74 %
Date Time Interrogation Session: 20240219130413
Implantable Lead Connection Status: 753985
Implantable Lead Connection Status: 753985
Implantable Lead Implant Date: 20230518
Implantable Lead Implant Date: 20230518
Implantable Lead Location: 753859
Implantable Lead Location: 753860
Implantable Lead Model: 3830
Implantable Lead Model: 5076
Implantable Pulse Generator Implant Date: 20230518
Lead Channel Impedance Value: 361 Ohm
Lead Channel Impedance Value: 399 Ohm
Lead Channel Impedance Value: 418 Ohm
Lead Channel Impedance Value: 665 Ohm
Lead Channel Pacing Threshold Amplitude: 0.5 V
Lead Channel Pacing Threshold Amplitude: 1.125 V
Lead Channel Pacing Threshold Pulse Width: 0.4 ms
Lead Channel Pacing Threshold Pulse Width: 0.4 ms
Lead Channel Sensing Intrinsic Amplitude: 2.75 mV
Lead Channel Sensing Intrinsic Amplitude: 2.75 mV
Lead Channel Sensing Intrinsic Amplitude: 26.5 mV
Lead Channel Sensing Intrinsic Amplitude: 26.5 mV
Lead Channel Setting Pacing Amplitude: 2 V
Lead Channel Setting Pacing Amplitude: 2.25 V
Lead Channel Setting Pacing Pulse Width: 0.4 ms
Lead Channel Setting Sensing Sensitivity: 1.2 mV
Zone Setting Status: 755011

## 2023-01-19 ENCOUNTER — Ambulatory Visit
Admission: RE | Admit: 2023-01-19 | Discharge: 2023-01-19 | Disposition: A | Discharge status: Home / Self Care | Payer: Medicare HMO | Source: Ambulatory Visit | Attending: Internal Medicine | Admitting: Internal Medicine

## 2023-01-19 ENCOUNTER — Other Ambulatory Visit: Payer: Self-pay | Admitting: Internal Medicine

## 2023-01-19 DIAGNOSIS — M545 Low back pain, unspecified: Secondary | ICD-10-CM

## 2023-01-27 DIAGNOSIS — M85852 Other specified disorders of bone density and structure, left thigh: Secondary | ICD-10-CM | POA: Diagnosis not present

## 2023-01-27 DIAGNOSIS — M85851 Other specified disorders of bone density and structure, right thigh: Secondary | ICD-10-CM | POA: Diagnosis not present

## 2023-01-27 DIAGNOSIS — R413 Other amnesia: Secondary | ICD-10-CM | POA: Diagnosis not present

## 2023-01-27 DIAGNOSIS — F322 Major depressive disorder, single episode, severe without psychotic features: Secondary | ICD-10-CM | POA: Diagnosis not present

## 2023-03-03 NOTE — Progress Notes (Signed)
Remote pacemaker transmission.   

## 2023-03-08 DIAGNOSIS — Z96653 Presence of artificial knee joint, bilateral: Secondary | ICD-10-CM | POA: Diagnosis not present

## 2023-03-08 DIAGNOSIS — M25562 Pain in left knee: Secondary | ICD-10-CM | POA: Diagnosis not present

## 2023-03-08 DIAGNOSIS — M7062 Trochanteric bursitis, left hip: Secondary | ICD-10-CM | POA: Diagnosis not present

## 2023-04-13 ENCOUNTER — Ambulatory Visit: Payer: Medicare HMO | Attending: Student | Admitting: Student

## 2023-04-13 ENCOUNTER — Encounter: Payer: Self-pay | Admitting: Student

## 2023-04-13 VITALS — BP 124/78 | HR 81 | Ht 62.0 in | Wt 158.8 lb

## 2023-04-13 DIAGNOSIS — I442 Atrioventricular block, complete: Secondary | ICD-10-CM

## 2023-04-13 DIAGNOSIS — R5383 Other fatigue: Secondary | ICD-10-CM

## 2023-04-13 LAB — CUP PACEART INCLINIC DEVICE CHECK
Battery Remaining Longevity: 134 mo
Battery Voltage: 3.05 V
Brady Statistic AP VP Percent: 31.85 %
Brady Statistic AP VS Percent: 0 %
Brady Statistic AS VP Percent: 67.98 %
Brady Statistic AS VS Percent: 0.17 %
Brady Statistic RA Percent Paced: 31.92 %
Brady Statistic RV Percent Paced: 99.83 %
Date Time Interrogation Session: 20240514121800
Implantable Lead Connection Status: 753985
Implantable Lead Connection Status: 753985
Implantable Lead Implant Date: 20230518
Implantable Lead Implant Date: 20230518
Implantable Lead Location: 753859
Implantable Lead Location: 753860
Implantable Lead Model: 3830
Implantable Lead Model: 5076
Implantable Pulse Generator Implant Date: 20230518
Lead Channel Impedance Value: 361 Ohm
Lead Channel Impedance Value: 399 Ohm
Lead Channel Impedance Value: 437 Ohm
Lead Channel Impedance Value: 627 Ohm
Lead Channel Pacing Threshold Amplitude: 0.5 V
Lead Channel Pacing Threshold Amplitude: 1.125 V
Lead Channel Pacing Threshold Pulse Width: 0.4 ms
Lead Channel Pacing Threshold Pulse Width: 0.4 ms
Lead Channel Sensing Intrinsic Amplitude: 2.375 mV
Lead Channel Sensing Intrinsic Amplitude: 26.5 mV
Lead Channel Sensing Intrinsic Amplitude: 26.5 mV
Lead Channel Sensing Intrinsic Amplitude: 3.25 mV
Lead Channel Setting Pacing Amplitude: 2 V
Lead Channel Setting Pacing Amplitude: 2.25 V
Lead Channel Setting Pacing Pulse Width: 0.4 ms
Lead Channel Setting Sensing Sensitivity: 1.2 mV
Zone Setting Status: 755011

## 2023-04-13 NOTE — Progress Notes (Signed)
  Electrophysiology Office Note:   Date:  04/13/2023  ID:  Jordan Lane, DOB 16-Jul-1946, MRN [id number]  Primary Cardiologist: None Electrophysiologist: Will Jorja Loa, MD   History of Present Illness:   Jordan CHRISCO is a 77 y.o. female with h/o CHB s/p MDT DDD PPM 03/2022, CVA, HTN, and HLD seen today for routine electrophysiology followup.   Since last being seen in our clinic the patient reports doing about the same. She complains of fatigue that she feels never really got better after her admission last year. She also struggles with her CPAP and has tried 3 different masks. She sometimes sleeps for 2 hours, other 9 hours. She denies chest pain or overt SOB, mostly just fatigue. No syncope or edema.  Review of systems complete and found to be negative unless listed in HPI.   Device History: Medtronic Dual Chamber PPM implanted 03/2022 for CHB  Studies Reviewed:    PPM Interrogation-  reviewed in detail today,  See PACEART report.  EKG is not ordered today. EKG from 06/2022 reviewed which showed V pacing with QRS ~120  Physical Exam:   VS:  BP 124/78   Pulse 81   Ht 5\' 2"  (1.575 m)   Wt 158 lb 12.8 oz (72 kg)   SpO2 98%   BMI 29.04 kg/m    Wt Readings from Last 3 Encounters:  04/13/23 158 lb 12.8 oz (72 kg)  07/24/22 145 lb 12.8 oz (66.1 kg)  04/18/22 150 lb 12.8 oz (68.4 kg)     GEN: Well nourished, well developed in no acute distress NECK: No JVD; No carotid bruits CARDIAC: Regular rate and rhythm, no murmurs, rubs, gallops RESPIRATORY:  Clear to auscultation without rales, wheezing or rhonchi  ABDOMEN: Soft, non-tender, non-distended EXTREMITIES:  No edema; No deformity   ASSESSMENT AND PLAN:    CHB s/p Medtronic PPM  Normal PPM function See Pace Art report No changes today  Fatigue Suspect multifactorial.  Her paced QRS is ~120, so while PPM syndrome is unlikely, will still update Echo for completeness.  BMET, CBC, TSH today as well.    HTN Stable on current regimen   OSA  Most likely contributor to her fatigue.  Encouraged her to continue to work with her provider to find a mask she is comfortable with.   Disposition:   Follow up with Dr. Elberta Fortis in 12 months, sooner with issues or significant work up.   Signed, Graciella Freer, PA-C

## 2023-04-13 NOTE — Patient Instructions (Signed)
Medication Instructions:  Your physician recommends that you continue on your current medications as directed. Please refer to the Current Medication list given to you today.  *If you need a refill on your cardiac medications before your next appointment, please call your pharmacy*  Lab Work: BMET, CBC, TSH--TODAY If you have labs (blood work) drawn today and your tests are completely normal, you will receive your results only by: MyChart Message (if you have MyChart) OR A paper copy in the mail If you have any lab test that is abnormal or we need to change your treatment, we will call you to review the results.  Testing/Procedures: Your physician has requested that you have an echocardiogram. Echocardiography is a painless test that uses sound waves to create images of your heart. It provides your doctor with information about the size and shape of your heart and how well your heart's chambers and valves are working. This procedure takes approximately one hour. There are no restrictions for this procedure. Please do NOT wear cologne, perfume, aftershave, or lotions (deodorant is allowed). Please arrive 15 minutes prior to your appointment time.    Follow-Up: At [facility], you and your health needs are our priority.  As part of our continuing mission to provide you with exceptional heart care, we have created designated Provider Care Teams.  These Care Teams include your primary Cardiologist (physician) and Advanced Practice Providers (APPs -  Physician Assistants and Nurse Practitioners) who all work together to provide you with the care you need, when you need it.  Your next appointment:   1 year(s)  Provider:   Loman Brooklyn, MD

## 2023-04-14 LAB — CBC
Hematocrit: 44.3 % (ref 34.0–46.6)
Hemoglobin: 14.6 g/dL (ref 11.1–15.9)
MCH: 30.7 pg (ref 26.6–33.0)
MCHC: 33 g/dL (ref 31.5–35.7)
MCV: 93 fL (ref 79–97)
Platelets: 296 10*3/uL (ref 150–450)
RBC: 4.75 x10E6/uL (ref 3.77–5.28)
RDW: 12.5 % (ref 11.7–15.4)
WBC: 7.6 10*3/uL (ref 3.4–10.8)

## 2023-04-14 LAB — BASIC METABOLIC PANEL
BUN/Creatinine Ratio: 14 (ref 12–28)
BUN: 10 mg/dL (ref 8–27)
CO2: 28 mmol/L (ref 20–29)
Calcium: 10.1 mg/dL (ref 8.7–10.3)
Chloride: 102 mmol/L (ref 96–106)
Creatinine, Ser: 0.73 mg/dL (ref 0.57–1.00)
Glucose: 100 mg/dL — ABNORMAL HIGH (ref 70–99)
Potassium: 4.1 mmol/L (ref 3.5–5.2)
Sodium: 144 mmol/L (ref 134–144)
eGFR: 84 mL/min/{1.73_m2} (ref >59)

## 2023-04-14 LAB — TSH: TSH: 1.61 u[IU]/mL (ref 0.450–4.500)

## 2023-04-19 ENCOUNTER — Ambulatory Visit (INDEPENDENT_AMBULATORY_CARE_PROVIDER_SITE_OTHER): Payer: Medicare HMO

## 2023-04-19 DIAGNOSIS — I442 Atrioventricular block, complete: Secondary | ICD-10-CM

## 2023-04-19 LAB — CUP PACEART REMOTE DEVICE CHECK
Battery Remaining Longevity: 135 mo
Battery Voltage: 3.05 V
Brady Statistic AP VP Percent: 18.54 %
Brady Statistic AP VS Percent: 0 %
Brady Statistic AS VP Percent: 81.43 %
Brady Statistic AS VS Percent: 0.03 %
Brady Statistic RA Percent Paced: 18.54 %
Brady Statistic RV Percent Paced: 99.97 %
Date Time Interrogation Session: 20240519202222
Implantable Lead Connection Status: 753985
Implantable Lead Connection Status: 753985
Implantable Lead Implant Date: 20230518
Implantable Lead Implant Date: 20230518
Implantable Lead Location: 753859
Implantable Lead Location: 753860
Implantable Lead Model: 3830
Implantable Lead Model: 5076
Implantable Pulse Generator Implant Date: 20230518
Lead Channel Impedance Value: 361 Ohm
Lead Channel Impedance Value: 399 Ohm
Lead Channel Impedance Value: 418 Ohm
Lead Channel Impedance Value: 646 Ohm
Lead Channel Pacing Threshold Amplitude: 0.5 V
Lead Channel Pacing Threshold Amplitude: 1.125 V
Lead Channel Pacing Threshold Pulse Width: 0.4 ms
Lead Channel Pacing Threshold Pulse Width: 0.4 ms
Lead Channel Sensing Intrinsic Amplitude: 2.375 mV
Lead Channel Sensing Intrinsic Amplitude: 2.375 mV
Lead Channel Sensing Intrinsic Amplitude: 26.5 mV
Lead Channel Sensing Intrinsic Amplitude: 26.5 mV
Lead Channel Setting Pacing Amplitude: 2 V
Lead Channel Setting Pacing Amplitude: 2.25 V
Lead Channel Setting Pacing Pulse Width: 0.4 ms
Lead Channel Setting Sensing Sensitivity: 1.2 mV
Zone Setting Status: 755011

## 2023-04-22 DIAGNOSIS — M8580 Other specified disorders of bone density and structure, unspecified site: Secondary | ICD-10-CM | POA: Diagnosis not present

## 2023-04-22 DIAGNOSIS — E781 Pure hyperglyceridemia: Secondary | ICD-10-CM | POA: Diagnosis not present

## 2023-04-22 DIAGNOSIS — I10 Essential (primary) hypertension: Secondary | ICD-10-CM | POA: Diagnosis not present

## 2023-04-22 DIAGNOSIS — K219 Gastro-esophageal reflux disease without esophagitis: Secondary | ICD-10-CM | POA: Diagnosis not present

## 2023-05-08 DIAGNOSIS — R300 Dysuria: Secondary | ICD-10-CM | POA: Diagnosis not present

## 2023-05-11 DIAGNOSIS — R109 Unspecified abdominal pain: Secondary | ICD-10-CM | POA: Diagnosis not present

## 2023-05-11 DIAGNOSIS — N390 Urinary tract infection, site not specified: Secondary | ICD-10-CM | POA: Diagnosis not present

## 2023-05-12 ENCOUNTER — Encounter: Payer: Medicare HMO | Admitting: Student

## 2023-05-14 ENCOUNTER — Ambulatory Visit (HOSPITAL_COMMUNITY): Payer: Medicare HMO | Attending: Student

## 2023-05-14 DIAGNOSIS — R5383 Other fatigue: Secondary | ICD-10-CM | POA: Diagnosis not present

## 2023-05-14 DIAGNOSIS — I442 Atrioventricular block, complete: Secondary | ICD-10-CM | POA: Insufficient documentation

## 2023-05-14 LAB — ECHOCARDIOGRAM COMPLETE
Area-P 1/2: 3.17 cm2
S' Lateral: 2.7 cm

## 2023-05-17 ENCOUNTER — Telehealth: Payer: Self-pay

## 2023-05-17 NOTE — Telephone Encounter (Signed)
-----   Message from Graciella Freer, PA-C sent at 05/14/2023  5:40 PM EDT ----- Peri [facility] news, heart function is normal!   Heart is slightly stiff which is very common in people with sleep apnea or high blood pressure

## 2023-05-17 NOTE — Telephone Encounter (Signed)
Patient aware of echo results. She verbalized understanding.

## 2023-05-18 NOTE — Progress Notes (Signed)
Remote pacemaker transmission.   

## 2023-05-19 DIAGNOSIS — R109 Unspecified abdominal pain: Secondary | ICD-10-CM | POA: Diagnosis not present

## 2023-05-19 DIAGNOSIS — R8290 Unspecified abnormal findings in urine: Secondary | ICD-10-CM | POA: Diagnosis not present

## 2023-05-20 ENCOUNTER — Other Ambulatory Visit: Payer: Self-pay | Admitting: Internal Medicine

## 2023-05-20 ENCOUNTER — Ambulatory Visit
Admission: RE | Admit: 2023-05-20 | Discharge: 2023-05-20 | Disposition: A | Discharge status: Home / Self Care | Payer: Medicare HMO | Source: Ambulatory Visit | Attending: Internal Medicine | Admitting: Internal Medicine

## 2023-05-20 DIAGNOSIS — K5730 Diverticulosis of large intestine without perforation or abscess without bleeding: Secondary | ICD-10-CM | POA: Diagnosis not present

## 2023-05-20 DIAGNOSIS — N390 Urinary tract infection, site not specified: Secondary | ICD-10-CM | POA: Diagnosis not present

## 2023-05-20 DIAGNOSIS — R109 Unspecified abdominal pain: Secondary | ICD-10-CM

## 2023-05-20 DIAGNOSIS — R197 Diarrhea, unspecified: Secondary | ICD-10-CM | POA: Diagnosis not present

## 2023-05-20 MED ORDER — IOPAMIDOL (ISOVUE-300) INJECTION 61%
88.0000 mL | Freq: Once | INTRAVENOUS | Status: AC | PRN
  Administered 2023-05-20: 88 mL via INTRAVENOUS

## 2023-05-21 ENCOUNTER — Other Ambulatory Visit: Payer: Medicare HMO

## 2023-05-26 DIAGNOSIS — Z96653 Presence of artificial knee joint, bilateral: Secondary | ICD-10-CM | POA: Diagnosis not present

## 2023-05-26 DIAGNOSIS — M2242 Chondromalacia patellae, left knee: Secondary | ICD-10-CM | POA: Diagnosis not present

## 2023-05-26 DIAGNOSIS — M7062 Trochanteric bursitis, left hip: Secondary | ICD-10-CM | POA: Diagnosis not present

## 2023-06-08 DIAGNOSIS — G4733 Obstructive sleep apnea (adult) (pediatric): Secondary | ICD-10-CM | POA: Diagnosis not present

## 2023-06-08 DIAGNOSIS — I10 Essential (primary) hypertension: Secondary | ICD-10-CM | POA: Diagnosis not present

## 2023-06-08 DIAGNOSIS — F329 Major depressive disorder, single episode, unspecified: Secondary | ICD-10-CM | POA: Diagnosis not present

## 2023-06-14 NOTE — Progress Notes (Signed)
Cardiology Office Note:  .   Date:  06/17/2023  ID:  Jordan Lane, DOB June 15, 1946, MRN [id number] PCP: Renford Dills, MD  [facility] Providers Cardiologist:  None Electrophysiologist:  Will Jorja Loa, MD    Patient Profile: .      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 09/11/2021>> sudden aphasia and rt sided anopia Tx with TNK Negative MRI and EEG Hypertension Hyperlipidemia OSA  Loop recorder implant 02/2022  History significant for hypertension.  She presented to the hospital 09/11/2021 with sudden aphasia and right-sided anopia. She had TNK administered and symptoms resolved. 30 day monitor revealed no evidence of atrial fibrillation. She was referred to EP and underwent ILR 03/05/2022.  Admission 04/06/2022 for confusion, slurred speech found to be in CHB. She underwent implant successful implantation of Medtronic dual chamber pacemaker on 04/16/22.   Last cardiology clinic visit was 04/13/23 with Otilio Saber, PA.  She reported having a difficult time with CPAP mask.  She reported fatigue, suspected to be multifactorial. Device function was normal with paced QRS ~ 120. PPM syndrome felt unlikely but Echocardiogram was done for completeness. Echo revealed normal LVEF with G1DD, no significant valve disease.        History of Present Illness: .   Jordan Lane is a pleasant 77 y.o. female who is here today for evaluation of fatigue. She reports she feels exhausted all of the time. Prior to [facility] implant 03/2022, she participated in water aerobics about 4 times per week.  Since that time , she has tried to resume water aerobics on several occasions, but could not keep up with the pace. Started walking outside for exercise but stopped when the weather got hot. She feels pool is best place to exercise due to arthritis pain.  Recent CBC, TSH, BMP 03/2022 unremarkable. Continues to have  erratic sleep patterns with CPAP mask. She denies chest pain, palpitations, shortness of breath, orthopnea, PND, edema.   ROS: See HPI       Studies Reviewed: Marland Kitchen   EKG Interpretation Date/Time:  Wednesday June 16 2023 14:15:23 EDT Ventricular Rate:  82 PR Interval:  178 QRS Duration:  126 QT Interval:  400 QTC Calculation: 467 R Axis:   -51  Text Interpretation: Atrial-sensed ventricular-paced rhythm When compared with ECG of 17-Apr-2022 06:54, Vent. rate has decreased BY   5 BPM Confirmed by Eligha Bridegroom [phone]) on 06/17/2023 7:15:10 AM     Risk Assessment/Calculations:             Physical Exam:   VS:  BP 132/80   Pulse 99   Ht 5\' 2"  (1.575 m)   Wt 160 lb (72.6 kg)   SpO2 97%   BMI 29.26 kg/m    Wt Readings from Last 3 Encounters:  06/16/23 160 lb (72.6 kg)  04/13/23 158 lb 12.8 oz (72 kg)  07/24/22 145 lb 12.8 oz (66.1 kg)    GEN: Well nourished, well developed in no acute distress NECK: No JVD; No carotid bruits CARDIAC: RRR, no murmurs, rubs, gallops RESPIRATORY:  Clear to auscultation without rales, wheezing or rhonchi  ABDOMEN: Soft, non-tender, non-distended EXTREMITIES:  No edema; No deformity     ASSESSMENT AND PLAN: .    Fatigue: She has been feeling fatigued since prior to PPM implant.  Thought she would get better one pacemaker was in place. Lengthy discussion about evaluation of fatigue as symptom of angina.  She reports no chest pain or shortness of breath with exertion. EKG today is unremarkable.  Echo 05/14/23 with normal heart function, no significant valve dysfunction. Consideration given to deconditioning given her chronic pain with arthritis. Encouraged return to pool for exercise, working on her own until she is ready to return to class. Notify us if she develops concerning cardiac symptoms with exercise.   CHB s/p PPM Implant: Normal device function on remote device check 04/19/23. No acute concerns today. Management per EP cardiology.    Hypertension: BP is well controlled.   Hyperlipidemia LDL goal < 70: LDL 44 on 01/13/23, well controlled. Continue atorvastatin.   OSA: Compliant with CPAP. Followed by Deboraha Sprang.        Dispo: 9 months with EP provider  Signed, Eligha Bridegroom, NP-C

## 2023-06-16 ENCOUNTER — Encounter: Payer: Self-pay | Admitting: Nurse Practitioner

## 2023-06-16 ENCOUNTER — Ambulatory Visit: Payer: Medicare HMO | Attending: Nurse Practitioner | Admitting: Nurse Practitioner

## 2023-06-16 VITALS — BP 132/80 | HR 99 | Ht 62.0 in | Wt 160.0 lb

## 2023-06-16 DIAGNOSIS — I10 Essential (primary) hypertension: Secondary | ICD-10-CM | POA: Diagnosis not present

## 2023-06-16 DIAGNOSIS — R5383 Other fatigue: Secondary | ICD-10-CM

## 2023-06-16 DIAGNOSIS — Z950 Presence of cardiac pacemaker: Secondary | ICD-10-CM | POA: Diagnosis not present

## 2023-06-16 DIAGNOSIS — E785 Hyperlipidemia, unspecified: Secondary | ICD-10-CM | POA: Diagnosis not present

## 2023-06-16 DIAGNOSIS — I442 Atrioventricular block, complete: Secondary | ICD-10-CM

## 2023-06-16 DIAGNOSIS — G4733 Obstructive sleep apnea (adult) (pediatric): Secondary | ICD-10-CM | POA: Diagnosis not present

## 2023-06-16 DIAGNOSIS — E7849 Other hyperlipidemia: Secondary | ICD-10-CM

## 2023-06-16 NOTE — Patient Instructions (Signed)
Medication Instructions:  Your physician recommends that you continue on your current medications as directed. Please refer to the Current Medication list given to you today.  *If you need a refill on your cardiac medications before your next appointment, please call your pharmacy*   Lab Work: None  If you have labs (blood work) drawn today and your tests are completely normal, you will receive your results only by: MyChart Message (if you have MyChart) OR A paper copy in the mail If you have any lab test that is abnormal or we need to change your treatment, we will call you to review the results.   Testing/Procedures: None   Follow-Up: At [facility], you and your health needs are our priority.  As part of our continuing mission to provide you with exceptional heart care, we have created designated Provider Care Teams.  These Care Teams include your primary Cardiologist (physician) and Advanced Practice Providers (APPs -  Physician Assistants and Nurse Practitioners) who all work together to provide you with the care you need, when you need it.  We recommend signing up for the patient portal called "MyChart".  Sign up information is provided on this After Visit Summary.  MyChart is used to connect with patients for Virtual Visits (Telemedicine).  Patients are able to view lab/test results, encounter notes, upcoming appointments, etc.  Non-urgent messages can be sent to your provider as well.   To learn more about what you can do with MyChart, go to [email or web address].    Your next appointment:   10 month(s)  Provider:   [name] Crown" Lanna Poche, [place]

## 2023-06-17 ENCOUNTER — Encounter: Payer: Self-pay | Admitting: Nurse Practitioner

## 2023-07-19 ENCOUNTER — Ambulatory Visit (INDEPENDENT_AMBULATORY_CARE_PROVIDER_SITE_OTHER): Payer: Medicare HMO

## 2023-07-19 DIAGNOSIS — I442 Atrioventricular block, complete: Secondary | ICD-10-CM | POA: Diagnosis not present

## 2023-07-20 LAB — CUP PACEART REMOTE DEVICE CHECK
Battery Remaining Longevity: 131 mo
Battery Voltage: 3.03 V
Brady Statistic AP VP Percent: 25.56 %
Brady Statistic AP VS Percent: 0 %
Brady Statistic AS VP Percent: 74.39 %
Brady Statistic AS VS Percent: 0.05 %
Brady Statistic RA Percent Paced: 25.57 %
Brady Statistic RV Percent Paced: 99.95 %
Date Time Interrogation Session: 20240819010427
Implantable Lead Connection Status: 753985
Implantable Lead Connection Status: 753985
Implantable Lead Implant Date: 20230518
Implantable Lead Implant Date: 20230518
Implantable Lead Location: 753859
Implantable Lead Location: 753860
Implantable Lead Model: 3830
Implantable Lead Model: 5076
Implantable Pulse Generator Implant Date: 20230518
Lead Channel Impedance Value: 361 Ohm
Lead Channel Impedance Value: 399 Ohm
Lead Channel Impedance Value: 418 Ohm
Lead Channel Impedance Value: 627 Ohm
Lead Channel Pacing Threshold Amplitude: 0.5 V
Lead Channel Pacing Threshold Amplitude: 1.125 V
Lead Channel Pacing Threshold Pulse Width: 0.4 ms
Lead Channel Pacing Threshold Pulse Width: 0.4 ms
Lead Channel Sensing Intrinsic Amplitude: 2.5 mV
Lead Channel Sensing Intrinsic Amplitude: 2.5 mV
Lead Channel Sensing Intrinsic Amplitude: 26.5 mV
Lead Channel Sensing Intrinsic Amplitude: 26.5 mV
Lead Channel Setting Pacing Amplitude: 2 V
Lead Channel Setting Pacing Amplitude: 2.25 V
Lead Channel Setting Pacing Pulse Width: 0.4 ms
Lead Channel Setting Sensing Sensitivity: 1.2 mV
Zone Setting Status: 755011

## 2023-07-29 DIAGNOSIS — G459 Transient cerebral ischemic attack, unspecified: Secondary | ICD-10-CM | POA: Diagnosis not present

## 2023-07-29 DIAGNOSIS — R350 Frequency of micturition: Secondary | ICD-10-CM | POA: Diagnosis not present

## 2023-07-29 DIAGNOSIS — R7309 Other abnormal glucose: Secondary | ICD-10-CM | POA: Diagnosis not present

## 2023-07-29 DIAGNOSIS — I10 Essential (primary) hypertension: Secondary | ICD-10-CM | POA: Diagnosis not present

## 2023-07-29 NOTE — Progress Notes (Signed)
Remote pacemaker transmission.   

## 2023-08-04 DIAGNOSIS — H18513 Endothelial corneal dystrophy, bilateral: Secondary | ICD-10-CM | POA: Diagnosis not present

## 2023-08-04 DIAGNOSIS — Z961 Presence of intraocular lens: Secondary | ICD-10-CM | POA: Diagnosis not present

## 2023-08-04 DIAGNOSIS — H52203 Unspecified astigmatism, bilateral: Secondary | ICD-10-CM | POA: Diagnosis not present

## 2023-08-04 DIAGNOSIS — H04123 Dry eye syndrome of bilateral lacrimal glands: Secondary | ICD-10-CM | POA: Diagnosis not present

## 2023-10-06 DIAGNOSIS — Z96653 Presence of artificial knee joint, bilateral: Secondary | ICD-10-CM | POA: Diagnosis not present

## 2023-10-06 DIAGNOSIS — M2242 Chondromalacia patellae, left knee: Secondary | ICD-10-CM | POA: Diagnosis not present

## 2023-10-06 DIAGNOSIS — M1612 Unilateral primary osteoarthritis, left hip: Secondary | ICD-10-CM | POA: Diagnosis not present

## 2023-10-06 DIAGNOSIS — M7062 Trochanteric bursitis, left hip: Secondary | ICD-10-CM | POA: Diagnosis not present

## 2023-10-18 ENCOUNTER — Ambulatory Visit (INDEPENDENT_AMBULATORY_CARE_PROVIDER_SITE_OTHER): Payer: Medicare HMO

## 2023-10-18 DIAGNOSIS — I442 Atrioventricular block, complete: Secondary | ICD-10-CM | POA: Diagnosis not present

## 2023-10-18 LAB — CUP PACEART REMOTE DEVICE CHECK
Battery Remaining Longevity: 134 mo
Battery Voltage: 3.03 V
Brady Statistic AP VP Percent: 16.84 %
Brady Statistic AP VS Percent: 0 %
Brady Statistic AS VP Percent: 83.04 %
Brady Statistic AS VS Percent: 0.13 %
Brady Statistic RA Percent Paced: 16.88 %
Brady Statistic RV Percent Paced: 99.87 %
Date Time Interrogation Session: 20241117201905
Implantable Lead Connection Status: 753985
Implantable Lead Connection Status: 753985
Implantable Lead Implant Date: 20230518
Implantable Lead Implant Date: 20230518
Implantable Lead Location: 753859
Implantable Lead Location: 753860
Implantable Lead Model: 3830
Implantable Lead Model: 5076
Implantable Pulse Generator Implant Date: 20230518
Lead Channel Impedance Value: 380 Ohm
Lead Channel Impedance Value: 399 Ohm
Lead Channel Impedance Value: 437 Ohm
Lead Channel Impedance Value: 551 Ohm
Lead Channel Pacing Threshold Amplitude: 0.5 V
Lead Channel Pacing Threshold Amplitude: 0.875 V
Lead Channel Pacing Threshold Pulse Width: 0.4 ms
Lead Channel Pacing Threshold Pulse Width: 0.4 ms
Lead Channel Sensing Intrinsic Amplitude: 2.875 mV
Lead Channel Sensing Intrinsic Amplitude: 2.875 mV
Lead Channel Sensing Intrinsic Amplitude: 23.125 mV
Lead Channel Sensing Intrinsic Amplitude: 23.125 mV
Lead Channel Setting Pacing Amplitude: 2 V
Lead Channel Setting Pacing Amplitude: 2 V
Lead Channel Setting Pacing Pulse Width: 0.4 ms
Lead Channel Setting Sensing Sensitivity: 1.2 mV
Zone Setting Status: 755011

## 2023-11-12 NOTE — Progress Notes (Signed)
Remote pacemaker transmission.   

## 2023-11-15 DIAGNOSIS — J0100 Acute maxillary sinusitis, unspecified: Secondary | ICD-10-CM | POA: Diagnosis not present

## 2024-01-17 ENCOUNTER — Ambulatory Visit (INDEPENDENT_AMBULATORY_CARE_PROVIDER_SITE_OTHER): Payer: Medicare HMO

## 2024-01-17 DIAGNOSIS — I442 Atrioventricular block, complete: Secondary | ICD-10-CM

## 2024-01-18 LAB — CUP PACEART REMOTE DEVICE CHECK
Battery Remaining Longevity: 126 mo
Battery Voltage: 3.03 V
Brady Statistic AP VP Percent: 20.26 %
Brady Statistic AP VS Percent: 0.59 %
Brady Statistic AS VP Percent: 60.03 %
Brady Statistic AS VS Percent: 19.11 %
Brady Statistic RA Percent Paced: 20.91 %
Brady Statistic RV Percent Paced: 80.29 %
Date Time Interrogation Session: 20250216200833
Implantable Lead Connection Status: 753985
Implantable Lead Connection Status: 753985
Implantable Lead Implant Date: 20230518
Implantable Lead Implant Date: 20230518
Implantable Lead Location: 753859
Implantable Lead Location: 753860
Implantable Lead Model: 3830
Implantable Lead Model: 5076
Implantable Pulse Generator Implant Date: 20230518
Lead Channel Impedance Value: 361 Ohm
Lead Channel Impedance Value: 380 Ohm
Lead Channel Impedance Value: 418 Ohm
Lead Channel Impedance Value: 608 Ohm
Lead Channel Pacing Threshold Amplitude: 0.5 V
Lead Channel Pacing Threshold Amplitude: 1.125 V
Lead Channel Pacing Threshold Pulse Width: 0.4 ms
Lead Channel Pacing Threshold Pulse Width: 0.4 ms
Lead Channel Sensing Intrinsic Amplitude: 17.375 mV
Lead Channel Sensing Intrinsic Amplitude: 17.375 mV
Lead Channel Sensing Intrinsic Amplitude: 2.625 mV
Lead Channel Sensing Intrinsic Amplitude: 2.625 mV
Lead Channel Setting Pacing Amplitude: 2 V
Lead Channel Setting Pacing Amplitude: 2.25 V
Lead Channel Setting Pacing Pulse Width: 0.4 ms
Lead Channel Setting Sensing Sensitivity: 1.2 mV
Zone Setting Status: 755011

## 2024-02-02 DIAGNOSIS — I700 Atherosclerosis of aorta: Secondary | ICD-10-CM | POA: Diagnosis not present

## 2024-02-02 DIAGNOSIS — M8580 Other specified disorders of bone density and structure, unspecified site: Secondary | ICD-10-CM | POA: Diagnosis not present

## 2024-02-02 DIAGNOSIS — R7301 Impaired fasting glucose: Secondary | ICD-10-CM | POA: Diagnosis not present

## 2024-02-02 DIAGNOSIS — G4733 Obstructive sleep apnea (adult) (pediatric): Secondary | ICD-10-CM | POA: Diagnosis not present

## 2024-02-02 DIAGNOSIS — I6930 Unspecified sequelae of cerebral infarction: Secondary | ICD-10-CM | POA: Diagnosis not present

## 2024-02-02 DIAGNOSIS — Z23 Encounter for immunization: Secondary | ICD-10-CM | POA: Diagnosis not present

## 2024-02-02 DIAGNOSIS — Z0000 Encounter for general adult medical examination without abnormal findings: Secondary | ICD-10-CM | POA: Diagnosis not present

## 2024-02-02 DIAGNOSIS — I442 Atrioventricular block, complete: Secondary | ICD-10-CM | POA: Diagnosis not present

## 2024-02-02 DIAGNOSIS — F329 Major depressive disorder, single episode, unspecified: Secondary | ICD-10-CM | POA: Diagnosis not present

## 2024-02-02 DIAGNOSIS — Z1331 Encounter for screening for depression: Secondary | ICD-10-CM | POA: Diagnosis not present

## 2024-02-02 DIAGNOSIS — G459 Transient cerebral ischemic attack, unspecified: Secondary | ICD-10-CM | POA: Diagnosis not present

## 2024-02-02 DIAGNOSIS — I10 Essential (primary) hypertension: Secondary | ICD-10-CM | POA: Diagnosis not present

## 2024-02-25 NOTE — Progress Notes (Signed)
 Remote pacemaker transmission.

## 2024-02-25 NOTE — Addendum Note (Signed)
 Addended by: Geralyn Flash D on: 02/25/2024 01:28 PM   Modules accepted: Orders

## 2024-03-22 DIAGNOSIS — M1712 Unilateral primary osteoarthritis, left knee: Secondary | ICD-10-CM | POA: Diagnosis not present

## 2024-03-22 DIAGNOSIS — M7062 Trochanteric bursitis, left hip: Secondary | ICD-10-CM | POA: Diagnosis not present

## 2024-03-29 DIAGNOSIS — H10413 Chronic giant papillary conjunctivitis, bilateral: Secondary | ICD-10-CM | POA: Diagnosis not present

## 2024-04-11 DIAGNOSIS — G4733 Obstructive sleep apnea (adult) (pediatric): Secondary | ICD-10-CM | POA: Diagnosis not present

## 2024-04-17 ENCOUNTER — Ambulatory Visit (INDEPENDENT_AMBULATORY_CARE_PROVIDER_SITE_OTHER): Payer: Medicare HMO

## 2024-04-17 DIAGNOSIS — I442 Atrioventricular block, complete: Secondary | ICD-10-CM | POA: Diagnosis not present

## 2024-04-18 ENCOUNTER — Ambulatory Visit: Payer: Medicare HMO | Attending: Cardiology | Admitting: Cardiology

## 2024-04-18 ENCOUNTER — Encounter: Payer: Self-pay | Admitting: Cardiology

## 2024-04-18 VITALS — BP 126/90 | HR 84 | Ht 62.0 in | Wt 145.0 lb

## 2024-04-18 DIAGNOSIS — I442 Atrioventricular block, complete: Secondary | ICD-10-CM

## 2024-04-18 DIAGNOSIS — I10 Essential (primary) hypertension: Secondary | ICD-10-CM

## 2024-04-18 DIAGNOSIS — G4733 Obstructive sleep apnea (adult) (pediatric): Secondary | ICD-10-CM

## 2024-04-18 LAB — CUP PACEART INCLINIC DEVICE CHECK
Battery Remaining Longevity: 120 mo
Battery Voltage: 3.02 V
Brady Statistic AP VP Percent: 19.07 %
Brady Statistic AP VS Percent: 0.28 %
Brady Statistic AS VP Percent: 72.11 %
Brady Statistic AS VS Percent: 8.54 %
Brady Statistic RA Percent Paced: 19.4 %
Brady Statistic RV Percent Paced: 91.19 %
Date Time Interrogation Session: 20250520141115
Implantable Lead Connection Status: 753985
Implantable Lead Connection Status: 753985
Implantable Lead Implant Date: 20230518
Implantable Lead Implant Date: 20230518
Implantable Lead Location: 753859
Implantable Lead Location: 753860
Implantable Lead Model: 3830
Implantable Lead Model: 5076
Implantable Pulse Generator Implant Date: 20230518
Lead Channel Impedance Value: 361 Ohm
Lead Channel Impedance Value: 418 Ohm
Lead Channel Impedance Value: 437 Ohm
Lead Channel Impedance Value: 608 Ohm
Lead Channel Pacing Threshold Amplitude: 0.5 V
Lead Channel Pacing Threshold Amplitude: 1 V
Lead Channel Pacing Threshold Amplitude: 1.25 V
Lead Channel Pacing Threshold Amplitude: 1.4 V
Lead Channel Pacing Threshold Pulse Width: 0.4 ms
Lead Channel Pacing Threshold Pulse Width: 0.4 ms
Lead Channel Pacing Threshold Pulse Width: 0.4 ms
Lead Channel Sensing Intrinsic Amplitude: 19 mV
Lead Channel Sensing Intrinsic Amplitude: 2.375 mV
Lead Channel Sensing Intrinsic Amplitude: 3.375 mV
Lead Channel Sensing Intrinsic Amplitude: 31.625 mV
Lead Channel Setting Pacing Amplitude: 2 V
Lead Channel Setting Pacing Amplitude: 2.5 V
Lead Channel Setting Pacing Pulse Width: 0.4 ms
Lead Channel Setting Sensing Sensitivity: 1.2 mV
Zone Setting Status: 755011

## 2024-04-18 LAB — CUP PACEART REMOTE DEVICE CHECK
Battery Remaining Longevity: 122 mo
Battery Voltage: 3.02 V
Brady Statistic AP VP Percent: 13.43 %
Brady Statistic AP VS Percent: 0.53 %
Brady Statistic AS VP Percent: 70.53 %
Brady Statistic AS VS Percent: 15.5 %
Brady Statistic RA Percent Paced: 14.04 %
Brady Statistic RV Percent Paced: 83.96 %
Date Time Interrogation Session: 20250518222240
Implantable Lead Connection Status: 753985
Implantable Lead Connection Status: 753985
Implantable Lead Implant Date: 20230518
Implantable Lead Implant Date: 20230518
Implantable Lead Location: 753859
Implantable Lead Location: 753860
Implantable Lead Model: 3830
Implantable Lead Model: 5076
Implantable Pulse Generator Implant Date: 20230518
Lead Channel Impedance Value: 361 Ohm
Lead Channel Impedance Value: 399 Ohm
Lead Channel Impedance Value: 418 Ohm
Lead Channel Impedance Value: 570 Ohm
Lead Channel Pacing Threshold Amplitude: 0.5 V
Lead Channel Pacing Threshold Amplitude: 1.125 V
Lead Channel Pacing Threshold Pulse Width: 0.4 ms
Lead Channel Pacing Threshold Pulse Width: 0.4 ms
Lead Channel Sensing Intrinsic Amplitude: 19 mV
Lead Channel Sensing Intrinsic Amplitude: 19 mV
Lead Channel Sensing Intrinsic Amplitude: 2.25 mV
Lead Channel Sensing Intrinsic Amplitude: 2.25 mV
Lead Channel Setting Pacing Amplitude: 2 V
Lead Channel Setting Pacing Amplitude: 2.25 V
Lead Channel Setting Pacing Pulse Width: 0.4 ms
Lead Channel Setting Sensing Sensitivity: 1.2 mV
Zone Setting Status: 755011

## 2024-04-18 NOTE — Progress Notes (Signed)
  Electrophysiology Office Note:   Date:  04/18/2024  ID:  Jordan Lane, DOB May 06, 1946, MRN [id number]  Primary Cardiologist: None Primary Heart Failure: None Electrophysiologist: Beza Steppe Cortland Ding, MD      History of Present Illness:   Jordan Lane is a 78 y.o. female with h/o hypertension, CVA, complete heart block seen today for routine electrophysiology followup.   Since last being seen in our clinic the patient reports continued fatigue.  She has no shortness of breath, but does find it difficult to do her daily activities due to her level of fatigue.  Her pacemaker is functioning appropriately.  She has normal increase in her heart rate based on device interrogation.  She is minimally compliant with her CPAP.  She says that it falls off at night..  she denies chest pain, palpitations, dyspnea, PND, orthopnea, nausea, vomiting, dizziness, syncope, edema, weight gain, or early satiety.   Review of systems complete and found to be negative unless listed in HPI.      EP Information / Studies Reviewed:    EKG is ordered today. Personal review as below.  EKG Interpretation Date/Time:  Tuesday Apr 18 2024 13:52:07 EDT Ventricular Rate:  84 PR Interval:  178 QRS Duration:  118 QT Interval:  394 QTC Calculation: 465 R Axis:   -39  Text Interpretation: Atrial-sensed ventricular-paced rhythm When compared with ECG of 16-Jun-2023 14:15, Vent. rate has increased BY   2 BPM Confirmed by Maxima Skelton ([id number]) on 04/18/2024 1:59:35 PM   PPM Interrogation-  reviewed in detail today,  See PACEART report.  Device History: Medtronic Dual Chamber PPM implanted 04/16/2022 for CHB  Risk Assessment/Calculations:             Physical Exam:   VS:  BP (!) 126/90 (BP Location: Right Arm, Patient Position: Sitting, Cuff Size: Normal)   Pulse 84   Ht 5\' 2"  (1.575 m)   Wt 145 lb (65.8 kg)   SpO2 96%   BMI 26.52 kg/m    Wt Readings from Last 3 Encounters:  04/18/24 145 lb (65.8 kg)   06/16/23 160 lb (72.6 kg)  04/13/23 158 lb 12.8 oz (72 kg)     GEN: Well nourished, well developed in no acute distress NECK: No JVD; No carotid bruits CARDIAC: Regular rate and rhythm, no murmurs, rubs, gallops RESPIRATORY:  Clear to auscultation without rales, wheezing or rhonchi  ABDOMEN: Soft, non-tender, non-distended EXTREMITIES:  No edema; No deformity   ASSESSMENT AND PLAN:    CHB s/p Medtronic PPM  Normal PPM function Sensing, threshold, impedance within normal limits Programming appropriate See Pace Art report No changes today  2.  Hypertension: Well-controlled  3.  Obstructive sleep apnea: CPAP compliance encouraged.  I have encouraged her to talk to her sleep physician about alternative options to CPAP.  Disposition:   Follow up with EP APP in 12 months  Signed, Carrol Hougland Cortland Ding, MD

## 2024-04-19 ENCOUNTER — Ambulatory Visit: Payer: Self-pay | Admitting: Cardiology

## 2024-05-16 DIAGNOSIS — G459 Transient cerebral ischemic attack, unspecified: Secondary | ICD-10-CM | POA: Diagnosis not present

## 2024-05-16 DIAGNOSIS — I10 Essential (primary) hypertension: Secondary | ICD-10-CM | POA: Diagnosis not present

## 2024-05-29 DIAGNOSIS — G459 Transient cerebral ischemic attack, unspecified: Secondary | ICD-10-CM | POA: Diagnosis not present

## 2024-05-29 DIAGNOSIS — F322 Major depressive disorder, single episode, severe without psychotic features: Secondary | ICD-10-CM | POA: Diagnosis not present

## 2024-05-29 DIAGNOSIS — F329 Major depressive disorder, single episode, unspecified: Secondary | ICD-10-CM | POA: Diagnosis not present

## 2024-05-29 DIAGNOSIS — I10 Essential (primary) hypertension: Secondary | ICD-10-CM | POA: Diagnosis not present

## 2024-06-07 NOTE — Addendum Note (Signed)
 Addended by: TAWNI DRILLING D on: 06/07/2024 02:04 PM   Modules accepted: Orders

## 2024-06-07 NOTE — Progress Notes (Signed)
 Remote pacemaker transmission.

## 2024-06-20 DIAGNOSIS — G459 Transient cerebral ischemic attack, unspecified: Secondary | ICD-10-CM | POA: Diagnosis not present

## 2024-06-20 DIAGNOSIS — I10 Essential (primary) hypertension: Secondary | ICD-10-CM | POA: Diagnosis not present

## 2024-06-24 DIAGNOSIS — K5790 Diverticulosis of intestine, part unspecified, without perforation or abscess without bleeding: Secondary | ICD-10-CM | POA: Diagnosis not present

## 2024-06-24 DIAGNOSIS — G8929 Other chronic pain: Secondary | ICD-10-CM | POA: Diagnosis not present

## 2024-06-24 DIAGNOSIS — E785 Hyperlipidemia, unspecified: Secondary | ICD-10-CM | POA: Diagnosis not present

## 2024-06-24 DIAGNOSIS — G4700 Insomnia, unspecified: Secondary | ICD-10-CM | POA: Diagnosis not present

## 2024-06-24 DIAGNOSIS — G4733 Obstructive sleep apnea (adult) (pediatric): Secondary | ICD-10-CM | POA: Diagnosis not present

## 2024-06-24 DIAGNOSIS — E669 Obesity, unspecified: Secondary | ICD-10-CM | POA: Diagnosis not present

## 2024-06-24 DIAGNOSIS — I10 Essential (primary) hypertension: Secondary | ICD-10-CM | POA: Diagnosis not present

## 2024-06-24 DIAGNOSIS — I442 Atrioventricular block, complete: Secondary | ICD-10-CM | POA: Diagnosis not present

## 2024-06-24 DIAGNOSIS — M1990 Unspecified osteoarthritis, unspecified site: Secondary | ICD-10-CM | POA: Diagnosis not present

## 2024-06-24 DIAGNOSIS — K219 Gastro-esophageal reflux disease without esophagitis: Secondary | ICD-10-CM | POA: Diagnosis not present

## 2024-06-24 DIAGNOSIS — M8580 Other specified disorders of bone density and structure, unspecified site: Secondary | ICD-10-CM | POA: Diagnosis not present

## 2024-06-24 DIAGNOSIS — E876 Hypokalemia: Secondary | ICD-10-CM | POA: Diagnosis not present

## 2024-06-29 DIAGNOSIS — F322 Major depressive disorder, single episode, severe without psychotic features: Secondary | ICD-10-CM | POA: Diagnosis not present

## 2024-06-29 DIAGNOSIS — I10 Essential (primary) hypertension: Secondary | ICD-10-CM | POA: Diagnosis not present

## 2024-06-29 DIAGNOSIS — G459 Transient cerebral ischemic attack, unspecified: Secondary | ICD-10-CM | POA: Diagnosis not present

## 2024-06-29 DIAGNOSIS — F329 Major depressive disorder, single episode, unspecified: Secondary | ICD-10-CM | POA: Diagnosis not present

## 2024-07-17 ENCOUNTER — Ambulatory Visit (INDEPENDENT_AMBULATORY_CARE_PROVIDER_SITE_OTHER): Payer: Medicare HMO

## 2024-07-17 DIAGNOSIS — I442 Atrioventricular block, complete: Secondary | ICD-10-CM

## 2024-07-18 LAB — CUP PACEART REMOTE DEVICE CHECK
Battery Remaining Longevity: 125 mo
Battery Voltage: 3.01 V
Brady Statistic AP VP Percent: 16.58 %
Brady Statistic AP VS Percent: 0 %
Brady Statistic AS VP Percent: 83.36 %
Brady Statistic AS VS Percent: 0.06 %
Brady Statistic RA Percent Paced: 16.59 %
Brady Statistic RV Percent Paced: 99.94 %
Date Time Interrogation Session: 20250817185636
Implantable Lead Connection Status: 753985
Implantable Lead Connection Status: 753985
Implantable Lead Implant Date: 20230518
Implantable Lead Implant Date: 20230518
Implantable Lead Location: 753859
Implantable Lead Location: 753860
Implantable Lead Model: 3830
Implantable Lead Model: 5076
Implantable Pulse Generator Implant Date: 20230518
Lead Channel Impedance Value: 342 Ohm
Lead Channel Impedance Value: 380 Ohm
Lead Channel Impedance Value: 399 Ohm
Lead Channel Impedance Value: 513 Ohm
Lead Channel Pacing Threshold Amplitude: 0.5 V
Lead Channel Pacing Threshold Amplitude: 1 V
Lead Channel Pacing Threshold Pulse Width: 0.4 ms
Lead Channel Pacing Threshold Pulse Width: 0.4 ms
Lead Channel Sensing Intrinsic Amplitude: 2.25 mV
Lead Channel Sensing Intrinsic Amplitude: 2.25 mV
Lead Channel Sensing Intrinsic Amplitude: 29.375 mV
Lead Channel Sensing Intrinsic Amplitude: 29.375 mV
Lead Channel Setting Pacing Amplitude: 2 V
Lead Channel Setting Pacing Amplitude: 2 V
Lead Channel Setting Pacing Pulse Width: 0.4 ms
Lead Channel Setting Sensing Sensitivity: 1.2 mV
Zone Setting Status: 755011

## 2024-07-20 ENCOUNTER — Ambulatory Visit: Payer: Self-pay | Admitting: Cardiology

## 2024-07-20 DIAGNOSIS — G459 Transient cerebral ischemic attack, unspecified: Secondary | ICD-10-CM | POA: Diagnosis not present

## 2024-07-20 DIAGNOSIS — I10 Essential (primary) hypertension: Secondary | ICD-10-CM | POA: Diagnosis not present

## 2024-07-30 DIAGNOSIS — F329 Major depressive disorder, single episode, unspecified: Secondary | ICD-10-CM | POA: Diagnosis not present

## 2024-07-30 DIAGNOSIS — I10 Essential (primary) hypertension: Secondary | ICD-10-CM | POA: Diagnosis not present

## 2024-07-30 DIAGNOSIS — F322 Major depressive disorder, single episode, severe without psychotic features: Secondary | ICD-10-CM | POA: Diagnosis not present

## 2024-07-30 DIAGNOSIS — G459 Transient cerebral ischemic attack, unspecified: Secondary | ICD-10-CM | POA: Diagnosis not present

## 2024-08-03 DIAGNOSIS — I442 Atrioventricular block, complete: Secondary | ICD-10-CM | POA: Diagnosis not present

## 2024-08-03 DIAGNOSIS — R197 Diarrhea, unspecified: Secondary | ICD-10-CM | POA: Diagnosis not present

## 2024-08-03 DIAGNOSIS — I6930 Unspecified sequelae of cerebral infarction: Secondary | ICD-10-CM | POA: Diagnosis not present

## 2024-08-03 DIAGNOSIS — G4733 Obstructive sleep apnea (adult) (pediatric): Secondary | ICD-10-CM | POA: Diagnosis not present

## 2024-08-03 DIAGNOSIS — F329 Major depressive disorder, single episode, unspecified: Secondary | ICD-10-CM | POA: Diagnosis not present

## 2024-08-03 DIAGNOSIS — R7301 Impaired fasting glucose: Secondary | ICD-10-CM | POA: Diagnosis not present

## 2024-08-03 DIAGNOSIS — I700 Atherosclerosis of aorta: Secondary | ICD-10-CM | POA: Diagnosis not present

## 2024-08-03 DIAGNOSIS — M8580 Other specified disorders of bone density and structure, unspecified site: Secondary | ICD-10-CM | POA: Diagnosis not present

## 2024-08-03 DIAGNOSIS — G459 Transient cerebral ischemic attack, unspecified: Secondary | ICD-10-CM | POA: Diagnosis not present

## 2024-08-03 DIAGNOSIS — I10 Essential (primary) hypertension: Secondary | ICD-10-CM | POA: Diagnosis not present

## 2024-08-19 DIAGNOSIS — I10 Essential (primary) hypertension: Secondary | ICD-10-CM | POA: Diagnosis not present

## 2024-08-19 DIAGNOSIS — G459 Transient cerebral ischemic attack, unspecified: Secondary | ICD-10-CM | POA: Diagnosis not present

## 2024-08-23 NOTE — Progress Notes (Signed)
 Remote PPM Transmission

## 2024-08-29 DIAGNOSIS — G459 Transient cerebral ischemic attack, unspecified: Secondary | ICD-10-CM | POA: Diagnosis not present

## 2024-08-29 DIAGNOSIS — F322 Major depressive disorder, single episode, severe without psychotic features: Secondary | ICD-10-CM | POA: Diagnosis not present

## 2024-08-29 DIAGNOSIS — I10 Essential (primary) hypertension: Secondary | ICD-10-CM | POA: Diagnosis not present

## 2024-08-29 DIAGNOSIS — F329 Major depressive disorder, single episode, unspecified: Secondary | ICD-10-CM | POA: Diagnosis not present

## 2024-09-02 DIAGNOSIS — Z23 Encounter for immunization: Secondary | ICD-10-CM | POA: Diagnosis not present

## 2024-09-18 DIAGNOSIS — I10 Essential (primary) hypertension: Secondary | ICD-10-CM | POA: Diagnosis not present

## 2024-09-18 DIAGNOSIS — G459 Transient cerebral ischemic attack, unspecified: Secondary | ICD-10-CM | POA: Diagnosis not present

## 2024-09-29 DIAGNOSIS — G459 Transient cerebral ischemic attack, unspecified: Secondary | ICD-10-CM | POA: Diagnosis not present

## 2024-09-29 DIAGNOSIS — F322 Major depressive disorder, single episode, severe without psychotic features: Secondary | ICD-10-CM | POA: Diagnosis not present

## 2024-09-29 DIAGNOSIS — F329 Major depressive disorder, single episode, unspecified: Secondary | ICD-10-CM | POA: Diagnosis not present

## 2024-09-29 DIAGNOSIS — I10 Essential (primary) hypertension: Secondary | ICD-10-CM | POA: Diagnosis not present

## 2024-10-16 ENCOUNTER — Ambulatory Visit: Payer: Medicare HMO

## 2024-10-16 DIAGNOSIS — I442 Atrioventricular block, complete: Secondary | ICD-10-CM

## 2024-10-16 LAB — CUP PACEART REMOTE DEVICE CHECK
Battery Remaining Longevity: 118 mo
Battery Voltage: 3.01 V
Brady Statistic AP VP Percent: 14.06 %
Brady Statistic AP VS Percent: 0 %
Brady Statistic AS VP Percent: 85.73 %
Brady Statistic AS VS Percent: 0.22 %
Brady Statistic RA Percent Paced: 14.19 %
Brady Statistic RV Percent Paced: 99.78 %
Date Time Interrogation Session: 20251116202341
Implantable Lead Connection Status: 753985
Implantable Lead Connection Status: 753985
Implantable Lead Implant Date: 20230518
Implantable Lead Implant Date: 20230518
Implantable Lead Location: 753859
Implantable Lead Location: 753860
Implantable Lead Model: 3830
Implantable Lead Model: 5076
Implantable Pulse Generator Implant Date: 20230518
Lead Channel Impedance Value: 361 Ohm
Lead Channel Impedance Value: 380 Ohm
Lead Channel Impedance Value: 399 Ohm
Lead Channel Impedance Value: 646 Ohm
Lead Channel Pacing Threshold Amplitude: 0.5 V
Lead Channel Pacing Threshold Amplitude: 1.125 V
Lead Channel Pacing Threshold Pulse Width: 0.4 ms
Lead Channel Pacing Threshold Pulse Width: 0.4 ms
Lead Channel Sensing Intrinsic Amplitude: 2.375 mV
Lead Channel Sensing Intrinsic Amplitude: 2.375 mV
Lead Channel Sensing Intrinsic Amplitude: 29.375 mV
Lead Channel Sensing Intrinsic Amplitude: 29.375 mV
Lead Channel Setting Pacing Amplitude: 2 V
Lead Channel Setting Pacing Amplitude: 2.25 V
Lead Channel Setting Pacing Pulse Width: 0.4 ms
Lead Channel Setting Sensing Sensitivity: 1.2 mV
Zone Setting Status: 755011

## 2024-10-18 ENCOUNTER — Ambulatory Visit: Payer: Self-pay | Admitting: Cardiology

## 2024-10-18 DIAGNOSIS — G459 Transient cerebral ischemic attack, unspecified: Secondary | ICD-10-CM | POA: Diagnosis not present

## 2024-10-18 DIAGNOSIS — I10 Essential (primary) hypertension: Secondary | ICD-10-CM | POA: Diagnosis not present

## 2024-10-18 NOTE — Progress Notes (Signed)
 Remote PPM Transmission

## 2024-10-29 DIAGNOSIS — F329 Major depressive disorder, single episode, unspecified: Secondary | ICD-10-CM | POA: Diagnosis not present

## 2024-10-29 DIAGNOSIS — G459 Transient cerebral ischemic attack, unspecified: Secondary | ICD-10-CM | POA: Diagnosis not present

## 2024-10-29 DIAGNOSIS — F322 Major depressive disorder, single episode, severe without psychotic features: Secondary | ICD-10-CM | POA: Diagnosis not present

## 2024-10-29 DIAGNOSIS — I10 Essential (primary) hypertension: Secondary | ICD-10-CM | POA: Diagnosis not present

## 2025-04-16 ENCOUNTER — Encounter

## 2025-07-16 ENCOUNTER — Encounter

## 2025-10-15 ENCOUNTER — Encounter

## 2026-01-14 ENCOUNTER — Encounter

## 2026-04-15 ENCOUNTER — Encounter
